# Patient Record
Sex: Female | Born: 1940 | Race: White | Hispanic: No | Marital: Married | Smoking: Former smoker
Health system: Southern US, Community
[De-identification: ages and names within clinical notes are randomized; demographics above are authoritative.]

## PROBLEM LIST (undated history)

## (undated) DIAGNOSIS — M1990 Unspecified osteoarthritis, unspecified site: Secondary | ICD-10-CM

## (undated) DIAGNOSIS — E785 Hyperlipidemia, unspecified: Secondary | ICD-10-CM

## (undated) DIAGNOSIS — F419 Anxiety disorder, unspecified: Secondary | ICD-10-CM

## (undated) DIAGNOSIS — R7303 Prediabetes: Secondary | ICD-10-CM

## (undated) DIAGNOSIS — T7840XA Allergy, unspecified, initial encounter: Secondary | ICD-10-CM

## (undated) DIAGNOSIS — K5790 Diverticulosis of intestine, part unspecified, without perforation or abscess without bleeding: Secondary | ICD-10-CM

## (undated) DIAGNOSIS — K219 Gastro-esophageal reflux disease without esophagitis: Secondary | ICD-10-CM

## (undated) DIAGNOSIS — H269 Unspecified cataract: Secondary | ICD-10-CM

## (undated) DIAGNOSIS — E039 Hypothyroidism, unspecified: Secondary | ICD-10-CM

## (undated) HISTORY — DX: Unspecified cataract: H26.9

## (undated) HISTORY — DX: Diverticulosis of intestine, part unspecified, without perforation or abscess without bleeding: K57.90

## (undated) HISTORY — PX: EYE SURGERY: SHX253

## (undated) HISTORY — PX: HEMORROIDECTOMY: SUR656

## (undated) HISTORY — PX: TUBAL LIGATION: SHX77

## (undated) HISTORY — DX: Hyperlipidemia, unspecified: E78.5

## (undated) HISTORY — DX: Allergy, unspecified, initial encounter: T78.40XA

## (undated) HISTORY — PX: COSMETIC SURGERY: SHX468

## (undated) HISTORY — PX: APPENDECTOMY: SHX54

## (undated) HISTORY — DX: Unspecified osteoarthritis, unspecified site: M19.90

## (undated) HISTORY — DX: Anxiety disorder, unspecified: F41.9

## (undated) HISTORY — DX: Prediabetes: R73.03

## (undated) HISTORY — PX: COLONOSCOPY: SHX174

## (undated) HISTORY — DX: Gastro-esophageal reflux disease without esophagitis: K21.9

## (undated) HISTORY — PX: BREAST SURGERY: SHX581

## (undated) HISTORY — DX: Hypothyroidism, unspecified: E03.9

---

## 2000-03-20 ENCOUNTER — Encounter (INDEPENDENT_AMBULATORY_CARE_PROVIDER_SITE_OTHER): Payer: Self-pay

## 2000-03-20 ENCOUNTER — Other Ambulatory Visit: Admission: RE | Admit: 2000-03-20 | Discharge: 2000-03-20 | Payer: Self-pay | Admitting: Internal Medicine

## 2000-10-14 ENCOUNTER — Inpatient Hospital Stay (HOSPITAL_COMMUNITY): Admission: EM | Admit: 2000-10-14 | Discharge: 2000-10-15 | Payer: Self-pay | Admitting: Emergency Medicine

## 2000-10-14 ENCOUNTER — Encounter: Payer: Self-pay | Admitting: Emergency Medicine

## 2000-11-29 ENCOUNTER — Ambulatory Visit (HOSPITAL_COMMUNITY): Admission: RE | Admit: 2000-11-29 | Discharge: 2000-11-29 | Payer: Self-pay | Admitting: Cardiology

## 2000-12-26 ENCOUNTER — Ambulatory Visit (HOSPITAL_COMMUNITY): Admission: RE | Admit: 2000-12-26 | Discharge: 2000-12-26 | Payer: Self-pay | Admitting: Family Medicine

## 2000-12-26 ENCOUNTER — Encounter: Payer: Self-pay | Admitting: Family Medicine

## 2001-01-14 ENCOUNTER — Encounter: Payer: Self-pay | Admitting: Family Medicine

## 2001-01-14 ENCOUNTER — Ambulatory Visit (HOSPITAL_COMMUNITY): Admission: RE | Admit: 2001-01-14 | Discharge: 2001-01-14 | Payer: Self-pay | Admitting: Family Medicine

## 2001-06-27 ENCOUNTER — Encounter: Admission: RE | Admit: 2001-06-27 | Discharge: 2001-06-27 | Payer: Self-pay

## 2001-08-30 ENCOUNTER — Encounter (INDEPENDENT_AMBULATORY_CARE_PROVIDER_SITE_OTHER): Payer: Self-pay | Admitting: *Deleted

## 2001-08-30 ENCOUNTER — Ambulatory Visit (HOSPITAL_BASED_OUTPATIENT_CLINIC_OR_DEPARTMENT_OTHER): Admission: RE | Admit: 2001-08-30 | Discharge: 2001-08-30 | Payer: Self-pay

## 2001-10-31 ENCOUNTER — Encounter: Payer: Self-pay | Admitting: Vascular Surgery

## 2001-11-04 ENCOUNTER — Ambulatory Visit (HOSPITAL_COMMUNITY): Admission: RE | Admit: 2001-11-04 | Discharge: 2001-11-04 | Payer: Self-pay | Admitting: Vascular Surgery

## 2002-01-29 ENCOUNTER — Encounter: Payer: Self-pay | Admitting: Family Medicine

## 2002-01-29 ENCOUNTER — Ambulatory Visit (HOSPITAL_COMMUNITY): Admission: RE | Admit: 2002-01-29 | Discharge: 2002-01-29 | Payer: Self-pay | Admitting: Family Medicine

## 2002-02-14 ENCOUNTER — Encounter: Admission: RE | Admit: 2002-02-14 | Discharge: 2002-02-14 | Payer: Self-pay | Admitting: Family Medicine

## 2002-02-14 ENCOUNTER — Encounter: Payer: Self-pay | Admitting: Family Medicine

## 2003-02-19 ENCOUNTER — Ambulatory Visit (HOSPITAL_COMMUNITY): Admission: RE | Admit: 2003-02-19 | Discharge: 2003-02-19 | Payer: Self-pay | Admitting: Family Medicine

## 2004-02-22 ENCOUNTER — Ambulatory Visit (HOSPITAL_COMMUNITY): Admission: RE | Admit: 2004-02-22 | Discharge: 2004-02-22 | Payer: Self-pay | Admitting: Family Medicine

## 2005-02-13 DIAGNOSIS — K5790 Diverticulosis of intestine, part unspecified, without perforation or abscess without bleeding: Secondary | ICD-10-CM

## 2005-02-13 HISTORY — DX: Diverticulosis of intestine, part unspecified, without perforation or abscess without bleeding: K57.90

## 2005-03-06 ENCOUNTER — Ambulatory Visit (HOSPITAL_COMMUNITY): Admission: RE | Admit: 2005-03-06 | Discharge: 2005-03-06 | Payer: Self-pay | Admitting: Family Medicine

## 2005-03-13 ENCOUNTER — Ambulatory Visit (HOSPITAL_COMMUNITY): Admission: RE | Admit: 2005-03-13 | Discharge: 2005-03-13 | Payer: Self-pay | Admitting: Family Medicine

## 2005-03-20 ENCOUNTER — Ambulatory Visit: Payer: Self-pay | Admitting: Internal Medicine

## 2005-04-03 ENCOUNTER — Ambulatory Visit: Payer: Self-pay | Admitting: Internal Medicine

## 2006-03-08 ENCOUNTER — Ambulatory Visit (HOSPITAL_COMMUNITY): Admission: RE | Admit: 2006-03-08 | Discharge: 2006-03-08 | Payer: Self-pay | Admitting: Family Medicine

## 2006-06-05 ENCOUNTER — Ambulatory Visit (HOSPITAL_COMMUNITY): Admission: RE | Admit: 2006-06-05 | Discharge: 2006-06-05 | Payer: Self-pay | Admitting: Family Medicine

## 2006-06-14 ENCOUNTER — Encounter (HOSPITAL_COMMUNITY): Admission: RE | Admit: 2006-06-14 | Discharge: 2006-07-14 | Payer: Self-pay | Admitting: Family Medicine

## 2007-01-17 ENCOUNTER — Ambulatory Visit (HOSPITAL_COMMUNITY): Admission: RE | Admit: 2007-01-17 | Discharge: 2007-01-17 | Payer: Self-pay | Admitting: Family Medicine

## 2007-01-22 ENCOUNTER — Ambulatory Visit (HOSPITAL_COMMUNITY): Admission: RE | Admit: 2007-01-22 | Discharge: 2007-01-22 | Payer: Self-pay | Admitting: Ophthalmology

## 2007-02-12 ENCOUNTER — Ambulatory Visit (HOSPITAL_COMMUNITY): Admission: RE | Admit: 2007-02-12 | Discharge: 2007-02-12 | Payer: Self-pay | Admitting: Ophthalmology

## 2007-03-11 ENCOUNTER — Ambulatory Visit (HOSPITAL_COMMUNITY): Admission: RE | Admit: 2007-03-11 | Discharge: 2007-03-11 | Payer: Self-pay | Admitting: Family Medicine

## 2007-05-15 ENCOUNTER — Ambulatory Visit (HOSPITAL_COMMUNITY): Admission: RE | Admit: 2007-05-15 | Discharge: 2007-05-15 | Payer: Self-pay | Admitting: Family Medicine

## 2007-07-22 ENCOUNTER — Ambulatory Visit (HOSPITAL_COMMUNITY): Admission: RE | Admit: 2007-07-22 | Discharge: 2007-07-22 | Payer: Self-pay | Admitting: Family Medicine

## 2007-12-14 ENCOUNTER — Emergency Department (HOSPITAL_COMMUNITY): Admission: EM | Admit: 2007-12-14 | Discharge: 2007-12-14 | Payer: Self-pay | Admitting: Emergency Medicine

## 2008-01-14 HISTORY — PX: CATARACT EXTRACTION W/ INTRAOCULAR LENS  IMPLANT, BILATERAL: SHX1307

## 2008-03-11 ENCOUNTER — Ambulatory Visit (HOSPITAL_COMMUNITY): Admission: RE | Admit: 2008-03-11 | Discharge: 2008-03-11 | Payer: Self-pay | Admitting: Family Medicine

## 2008-06-13 HISTORY — PX: DOPPLER ECHOCARDIOGRAPHY: SHX263

## 2008-06-17 ENCOUNTER — Ambulatory Visit (HOSPITAL_COMMUNITY): Admission: RE | Admit: 2008-06-17 | Discharge: 2008-06-17 | Payer: Self-pay | Admitting: Family Medicine

## 2009-08-13 ENCOUNTER — Ambulatory Visit (HOSPITAL_COMMUNITY): Admission: RE | Admit: 2009-08-13 | Discharge: 2009-08-13 | Payer: Self-pay | Admitting: Family Medicine

## 2009-08-13 HISTORY — PX: OTHER SURGICAL HISTORY: SHX169

## 2009-11-16 ENCOUNTER — Ambulatory Visit (HOSPITAL_COMMUNITY): Admission: RE | Admit: 2009-11-16 | Discharge: 2009-11-16 | Payer: Self-pay | Admitting: Family Medicine

## 2009-11-22 ENCOUNTER — Ambulatory Visit (HOSPITAL_COMMUNITY): Admission: RE | Admit: 2009-11-22 | Discharge: 2009-11-22 | Payer: Self-pay | Admitting: Family Medicine

## 2009-12-02 ENCOUNTER — Ambulatory Visit (HOSPITAL_COMMUNITY): Admission: RE | Admit: 2009-12-02 | Discharge: 2009-12-02 | Payer: Self-pay | Admitting: Family Medicine

## 2010-03-05 ENCOUNTER — Encounter: Payer: Self-pay | Admitting: Family Medicine

## 2010-03-06 ENCOUNTER — Encounter: Payer: Self-pay | Admitting: Family Medicine

## 2010-07-01 NOTE — Op Note (Signed)
[place]  Patient:    STACEY, SAGO Visit Number: [id number] MRN: [id number]          Service Type: DSU Location: [facility] Attending Physician:  Meredith Leeds Dictated by:   Zigmund Daniel, M.D. Proc. Date: 08/30/01 Admit Date:  08/30/2001 Discharge Date: 08/30/2001   CC:         Lilyan Punt, M.D.   Operative Report  PREOPERATIVE AND POSTOPERATIVE DIAGNOSIS:  Fibrocystic disease of left breast.  OPERATION:  Left breast biopsy.  SURGEON:  Zigmund Daniel, M.D.  ANESTHESIA:  Local with sedation.  PROCEDURE:  After the patient was monitored, sedated, and had routine preparation and draping of the left breast, I liberally infused local anesthetic in the upper central part of the breast near the site where I had marked the mass.  I made a circumareolar incision at the cephalad portion of the areola and dissected down through the skin into the deep subcutaneous tissues.  I dissected upward a few centimeters until I was above the mass and then generously biopsies the tissues containing the mass and around it.  I cut across a large cyst, which I think was most of the mass and the remainder of the tissue had a fibrocystic appearance.  I then got good hemostasis with the cautery and felt the tissues to be sure I had not left any mass behind.  I closed the skin with intracuticular 4-0 Vicryl and Steri-Strips and applied a slightly compressive bandage. Dictated by:   Zigmund Daniel, M.D. Attending Physician:  Meredith Leeds DD:  08/30/01 TD:  09/04/01 Job: [id number]

## 2010-07-01 NOTE — Procedures (Signed)
[facility]  Patient:    Jordan Grant, Jordan Grant Visit Number: [id number] MRN: [id number]          Service Type: OUT Location: RAD Attending Physician:  Lilyan Punt Dictated by:   Lilyan Punt, M.D. Admit Date:  12/26/2000 Discharge Date: 12/26/2000                            EKG Interpretations  ELECTROCARDIOGRAM INTERPRETATION:  Normal sinus rhythm, no acute ST segment changes are noted. There may be slight elevation of V5 and V6 in the ST segment, otherwise is a normal EKG. Dictated by:   Lilyan Punt, M.D. Attending Physician:  Lilyan Punt DD:  12/20/00 TD:  12/21/00 Job: [id number]

## 2010-07-01 NOTE — Procedures (Signed)
[facility]  Patient:    Jordan Grant, Jordan Grant Visit Number: [id number] MRN: [id number]          Service Type: OUT Location: RAD Attending Physician:  Kathlen Brunswick Dictated by:   Lilyan Punt, M.D. Proc. Date: 10/14/00 Admit Date:  11/29/2000 Discharge Date: 11/29/2000                            EKG Interpretations  INTERPRETATION:  Normal sinus rhythm.  There are no acute ST segment changes noted.  IMPRESSION:  Normal electrocardiogram. Dictated by:   Lilyan Punt, M.D. Attending Physician:  Kathlen Brunswick DD:  12/20/00 TD:  12/21/00 Job: [id number]

## 2010-07-01 NOTE — Procedures (Signed)
[facility]  Patient:    Jordan Grant, Jordan Grant Visit Number: [id number] MRN: [id number]          Service Type: OUT Location: RAD Attending Physician:  Lilyan Punt Dictated by:   Lilyan Punt, M.D. Proc. Date: 10/14/00 Admit Date:  12/26/2000 Discharge Date: 12/26/2000                            EKG Interpretations  TIME:  1150  INTERPRETATION:  Normal sinus rhythm.  Early transition.  Slight ST segment elevation noted in inferior leads.  IMPRESSION:  Borderline electrocardiogram. Dictated by:   Lilyan Punt, M.D. Attending Physician:  Lilyan Punt DD:  12/31/00 TD:  12/31/00 Job: [id number]

## 2010-07-01 NOTE — Procedures (Signed)
[facility]  Patient:    Jordan Grant, Jordan Grant Visit Number: [id number] MRN: [id number]          Service Type: OUT Location: RAD Attending Physician:  Lilyan Punt Dictated by:   Lilyan Punt, M.D. Proc. Date: 10/15/00 Admit Date:  12/26/2000 Discharge Date: 12/26/2000                            EKG Interpretations  TIME:  0904  INTERPRETATION:  Normal sinus rhythm.  Early transition.  Slight ST segment elevation.  IMPRESSION:  Borderline electrocardiogram. Dictated by:   Lilyan Punt, M.D. Attending Physician:  Lilyan Punt DD:  12/31/00 TD:  12/31/00 Job: [id number]

## 2010-07-01 NOTE — Op Note (Signed)
   NAME:  Jordan Grant, Jordan Grant                         ACCOUNT NO.:  [id number]   MEDICAL RECORD NO.:  [id number]                   PATIENT TYPE:  OIB   LOCATION:  2899                                 FACILITY:  [facility]   PHYSICIAN:  Larina Earthly, M.D.                 DATE OF BIRTH:  [birth year]   DATE OF PROCEDURE:  11/04/2001  DATE OF DISCHARGE:                                 OPERATIVE REPORT   PREOPERATIVE DIAGNOSIS:  Tributary varicosities, left leg.   POSTOPERATIVE DIAGNOSIS:  Tributary varicosities, left leg.   PROCEDURE:  Removal of tributary varicosities with Tri-Vex system.   SURGEON:  Larina Earthly, M.D.   ASSISTANT:  Eber Hong, P.A.   ANESTHESIA:  General endotracheal.   COMPLICATIONS:  None.   DISPOSITION:  To recovery room stable.   DESCRIPTION OF PROCEDURE:  The patient was taken to the operating room and  placed in the supine position after having her veins marked while standing.  She had had preoperative duplex evaluation revealing no evidence of  incompetence in her saphenous venous system.  Using the Tri-Vex system with  tumescent solution of epinephrine and lidocaine, the small stab incisions  were created and these veins were transilluminated.  Through separate stab  incisions, the cutter head was introduced and the tributary varicosities  were removed in their entirety.  The areas of removed tributary varicosities  were flushed with tumescent solution.  The incisions were closed with  Benzoin and Steri-Strips.  The leg was elevated, and a Kerlix and Coban  pressure dressing was applied.  The patient tolerated the procedure without  immediate complication, was transferred to the recovery room in stable  condition.                                               Larina Earthly, M.D.    TFE/MEDQ  D:  11/04/2001  T:  11/05/2001  Job:  [id number]

## 2010-10-18 ENCOUNTER — Other Ambulatory Visit: Payer: Self-pay | Admitting: Family Medicine

## 2010-10-18 DIAGNOSIS — Z139 Encounter for screening, unspecified: Secondary | ICD-10-CM

## 2010-11-21 LAB — BASIC METABOLIC PANEL
CO2: 31
Chloride: 104
GFR calc non Af Amer: >60
Glucose, Bld: 71
Potassium: 3.8
Sodium: 140

## 2010-11-21 LAB — CBC
HCT: 38.5
Hemoglobin: 12.9
MCV: 91.5
RDW: 12.4

## 2010-11-28 ENCOUNTER — Ambulatory Visit (HOSPITAL_COMMUNITY)
Admission: RE | Admit: 2010-11-28 | Discharge: 2010-11-28 | Disposition: A | Discharge status: Home / Self Care | Payer: Medicare Other | Source: Ambulatory Visit | Attending: Family Medicine | Admitting: Family Medicine

## 2010-11-28 DIAGNOSIS — Z139 Encounter for screening, unspecified: Secondary | ICD-10-CM

## 2010-11-28 DIAGNOSIS — Z1231 Encounter for screening mammogram for malignant neoplasm of breast: Secondary | ICD-10-CM | POA: Insufficient documentation

## 2011-08-01 ENCOUNTER — Ambulatory Visit (HOSPITAL_COMMUNITY)
Admission: RE | Admit: 2011-08-01 | Discharge: 2011-08-01 | Disposition: A | Discharge status: Home / Self Care | Payer: Medicare Other | Source: Ambulatory Visit | Attending: Family Medicine | Admitting: Family Medicine

## 2011-08-01 ENCOUNTER — Other Ambulatory Visit: Payer: Self-pay | Admitting: Family Medicine

## 2011-08-01 DIAGNOSIS — R1031 Right lower quadrant pain: Secondary | ICD-10-CM

## 2011-08-01 DIAGNOSIS — M25551 Pain in right hip: Secondary | ICD-10-CM

## 2011-08-01 DIAGNOSIS — M25559 Pain in unspecified hip: Secondary | ICD-10-CM | POA: Insufficient documentation

## 2011-08-25 ENCOUNTER — Ambulatory Visit (HOSPITAL_COMMUNITY)
Admission: RE | Admit: 2011-08-25 | Discharge: 2011-08-25 | Disposition: A | Discharge status: Home / Self Care | Payer: Medicare Other | Source: Ambulatory Visit | Attending: Family Medicine | Admitting: Family Medicine

## 2011-08-25 ENCOUNTER — Encounter (HOSPITAL_COMMUNITY): Payer: Self-pay

## 2011-08-25 DIAGNOSIS — R1031 Right lower quadrant pain: Secondary | ICD-10-CM

## 2011-08-25 MED ORDER — IOHEXOL 300 MG/ML  SOLN
100.0000 mL | Freq: Once | INTRAMUSCULAR | Status: AC | PRN
  Administered 2011-08-25: 100 mL via INTRAVENOUS

## 2011-08-28 ENCOUNTER — Ambulatory Visit (HOSPITAL_COMMUNITY): Payer: Medicare Other

## 2011-08-29 ENCOUNTER — Telehealth: Payer: Self-pay | Admitting: *Deleted

## 2011-08-29 NOTE — Telephone Encounter (Signed)
Dr Marina Goodell asked me to call the pt and make her an appointment to see an extender.  Dr. Lilyan Punt spoke to Dr. Marina Goodell on the phone about this pt.  Dr. Marina Goodell had done the pt's Colonoscopies in 2002 and 2007 .  I called the patient and she said she has had bloating, gas, abdominal pain.  She went on a bus trip recently and had a terrible time the first 3 days., The patient almost left the bus to fly home but decided against it.  I told her Willette Cluster ACNP has an opening tomorrow and the pt said that is fine as long as Dr Marina Goodell would be the one to do any procedures in the future.

## 2011-08-30 ENCOUNTER — Encounter: Payer: Self-pay | Admitting: *Deleted

## 2011-08-30 ENCOUNTER — Encounter: Payer: Self-pay | Admitting: Nurse Practitioner

## 2011-08-30 ENCOUNTER — Ambulatory Visit (INDEPENDENT_AMBULATORY_CARE_PROVIDER_SITE_OTHER): Payer: Medicare Other | Admitting: Nurse Practitioner

## 2011-08-30 VITALS — BP 104/60 | HR 68 | Ht 63.0 in | Wt 141.2 lb

## 2011-08-30 DIAGNOSIS — IMO0001 Reserved for inherently not codable concepts without codable children: Secondary | ICD-10-CM | POA: Insufficient documentation

## 2011-08-30 DIAGNOSIS — R142 Eructation: Secondary | ICD-10-CM

## 2011-08-30 DIAGNOSIS — R194 Change in bowel habit: Secondary | ICD-10-CM | POA: Insufficient documentation

## 2011-08-30 DIAGNOSIS — R141 Gas pain: Secondary | ICD-10-CM

## 2011-08-30 DIAGNOSIS — R198 Other specified symptoms and signs involving the digestive system and abdomen: Secondary | ICD-10-CM

## 2011-08-30 DIAGNOSIS — R1031 Right lower quadrant pain: Secondary | ICD-10-CM | POA: Insufficient documentation

## 2011-08-30 MED ORDER — ALIGN PO CAPS: 1.0000 | ORAL_CAPSULE | Freq: Every day | ORAL | Status: AC

## 2011-08-30 NOTE — Progress Notes (Signed)
08/30/2011 Jordan Grant [id number] 1940/07/29   HISTORY OF PRESENT ILLNESS:   71 year old female known to Dr. Marina Goodell for history of diverticulosis and adenomatous colon polyps. She had a surveillance colonoscopy February 2007, findings included diverticulosis and internal hemorrhoids. Patient referred by PCP for heme stool.      Over the last several months she has been having excessive gas and RLQ discomfort.  Patient also describes some RLQ pain present over the last few months. Pain not related to eating, BMs or urination. It has woken her up at night. Patient's sister recommended daily Miralax which patient took for 2-3 days. The first day it worked well but second day she developed loose stool. Diarrhea stopped, she went on a bus trip for 10 days and developed more bowel changes.  Stools turned pasty, dark. Bowel movements did normalize after about the 5th day on vacation. Upon returning home from the trip patient saw PCP, found to be heme positive. Labs in PCP office on 08/24/11 revealed hemoglobin of 13.1, MCV  89. Liver function studies were normal . Amylase and lipase were normal. No recent bismuth or iron products. No NSAID use. Because of the RLQ a CTscan was done and unremarkable. Patient's husband died with colon cancer, she has some anxiety around developing colon cancer herself.                                                                                                                                                                                                                                                                                                                                           Past Medical History  Diagnosis Date  . Hyperlipidemia   . Hypothyroidism   . Diverticulosis 2007    History of diverticula  . GERD (gastroesophageal reflux disease)    Past Surgical History  Procedure Date  . Hemorroidectomy   .  Appendectomy   . Cataract extraction w/  intraocular lens  implant, bilateral 12/09    Dr Micheal Likens bilateral  . Ml bone density 07/11  . Doppler echocardiography 06/2008    leg  . Tubal ligation     reports that she has quit smoking. She has never used smokeless tobacco. She reports that she drinks about one ounce of alcohol per week. She reports that she does not use illicit drugs. family history includes Breast cancer in her maternal aunt and Stomach cancer (age of onset:72) in her mother.  There is no history of Colon cancer. Allergies  Allergen Reactions  . Codeine     Nausea, dizziness  . Darvocet (Propoxyphene-Acetaminophen)     nausea      Outpatient Encounter Prescriptions as of 08/30/2011  Medication Sig Dispense Refill  . ALPRAZolam (XANAX) 0.5 MG tablet Take 0.25 mg by mouth at bedtime as needed.       Marland Kitchen levothyroxine (SYNTHROID, LEVOTHROID) 75 MCG tablet Take 75 mcg by mouth daily.      Marland Kitchen omeprazole (PRILOSEC) 40 MG capsule Take 40 mg by mouth daily.      Marland Kitchen DISCONTD: omeprazole (PRILOSEC) 20 MG capsule Take 40 mg by mouth daily.         REVIEW OF SYSTEMS  : All other systems reviewed and negative except where noted in the History of Present Illness.   PHYSICAL EXAM: BP 104/60  Pulse 68  Ht 5\' 3"  (1.6 m)  Wt 141 lb 3.2 oz (64.048 kg)  BMI 25.01 kg/m2 General: Well developed white female in no acute distress Head: Normocephalic and atraumatic Eyes:  sclerae anicteric,conjunctive pink. Ears: Normal auditory acuity Mouth: No deformity or lesions Neck: Supple, no masses.  Lungs: Clear throughout to auscultation Heart: Regular rate and rhythm; no murmurs heard Abdomen: Soft, non distended, nontender. No masses or hepatomegaly noted. Normal Bowel sounds Rectal: Light brown, Hemoccult-negative stool Musculoskeletal: Symmetrical with no gross deformities  Skin: No lesions on visible extremities Extremities: No edema or deformities noted Neurological: Alert oriented x 4, grossly nonfocal Cervical Nodes:  No  significant cervical adenopathy Psychological:  Alert and cooperative. Normal mood and affect  ASSESSMENT AND PLAN:  1. Gas. Trial of Align, samples given.   2. RLQ discomfort, possibly secondary to gas. The discomfort is improving. CT Scan and labs normal and this is reassuring.   3. Recent bowel change after taking Miralax. Bowel habits have returned to baseline after discontinuing Miralax and resuming daily Citrucel.   4. Hemoccult positive stool in PCP. Recent dark, but not black stool per patient. No upper GI symptoms. Stool color back to normal. Recent hemoglobin over 13. She is heme negative today. Low suspicion for recent GI bleed. If patient has recurrent bowel changes and /or dark stool then need to see her back asap. No indication for upper endoscopy at this point.   4. Colon cancer screening. Husband died of colon cancer, patient has anxiety around getting colon cancer herself. Patient is up-to-date on her colonoscopies. She is not anemic, bowel movements have returned to normal, weight is stable. No current indications for colonoscopy to be done sooner than previously recommended. Should patient have another change in her bowel habits, any rectal bleeding, or other alarm factors then we need to see her back in the office.

## 2011-08-30 NOTE — Patient Instructions (Addendum)
Call us if you have any recurrent bowel changes.   We have given you samples of Align probiotic. Take 1 capsule daily for 14 days.  Our number is [phone].

## 2011-08-31 ENCOUNTER — Telehealth: Payer: Self-pay | Admitting: Nurse Practitioner

## 2011-08-31 NOTE — Telephone Encounter (Signed)
Left a message for patient to call me. 

## 2011-08-31 NOTE — Progress Notes (Signed)
Reviewed and agree with management plan. Haston Casebolt T. Kiernan Farkas MD FACG 

## 2011-09-04 NOTE — Telephone Encounter (Signed)
Left a message for patient to call me. 

## 2011-09-05 NOTE — Telephone Encounter (Signed)
Spoke with patient and she was upset about the reason for visit comments on the paper she received. Reviewed with patient Jordan Cluster, Jordan Grant; notes and patient felt much better about her notes. She is using the Align and thinks it is helping some but states she is still having severe gas at times with abdominal pain. Scheduled patient to see Dr. Marina Goodell on 09/28/11 at 9:15 AM. She will call and cancel if she feels she does not need to come for this OV.

## 2011-09-26 ENCOUNTER — Telehealth: Payer: Self-pay | Admitting: Internal Medicine

## 2011-09-26 NOTE — Telephone Encounter (Signed)
Pt states she was supposed to call after she finished taking the align. Pt states she feels much better and she wants to cancel her appt for this week. Appt cancelled. Pt states she will call back if she develops any more problems.

## 2011-09-28 ENCOUNTER — Ambulatory Visit: Payer: Medicare Other | Admitting: Internal Medicine

## 2011-10-30 ENCOUNTER — Other Ambulatory Visit: Payer: Self-pay | Admitting: Family Medicine

## 2011-10-30 DIAGNOSIS — Z139 Encounter for screening, unspecified: Secondary | ICD-10-CM

## 2011-12-05 ENCOUNTER — Ambulatory Visit (HOSPITAL_COMMUNITY)
Admission: RE | Admit: 2011-12-05 | Discharge: 2011-12-05 | Disposition: A | Discharge status: Home / Self Care | Payer: Medicare Other | Source: Ambulatory Visit | Attending: Family Medicine | Admitting: Family Medicine

## 2011-12-05 DIAGNOSIS — Z139 Encounter for screening, unspecified: Secondary | ICD-10-CM

## 2011-12-05 DIAGNOSIS — Z1231 Encounter for screening mammogram for malignant neoplasm of breast: Secondary | ICD-10-CM | POA: Insufficient documentation

## 2012-07-19 ENCOUNTER — Encounter: Payer: Self-pay | Admitting: *Deleted

## 2012-08-22 ENCOUNTER — Telehealth: Payer: Self-pay | Admitting: Family Medicine

## 2012-08-22 NOTE — Telephone Encounter (Signed)
Patient needs BW paperwork-Call when done

## 2012-08-22 NOTE — Telephone Encounter (Signed)
Chart sent back °

## 2012-08-23 ENCOUNTER — Other Ambulatory Visit: Payer: Self-pay | Admitting: *Deleted

## 2012-08-23 DIAGNOSIS — E039 Hypothyroidism, unspecified: Secondary | ICD-10-CM

## 2012-08-23 DIAGNOSIS — R7301 Impaired fasting glucose: Secondary | ICD-10-CM

## 2012-08-23 DIAGNOSIS — Z79899 Other long term (current) drug therapy: Secondary | ICD-10-CM

## 2012-08-23 DIAGNOSIS — E782 Mixed hyperlipidemia: Secondary | ICD-10-CM

## 2012-08-23 NOTE — Telephone Encounter (Signed)
Lip/liv/tsh/glucose/HgA1C

## 2012-08-23 NOTE — Telephone Encounter (Signed)
Pt notified on her voicemail papers are ready for pick up.

## 2012-09-04 LAB — HEPATIC FUNCTION PANEL
Albumin: 4.1 g/dL (ref 3.5–5.2)
Alkaline Phosphatase: 78 U/L (ref 39–117)
Bilirubin, Direct: 0.1 mg/dL (ref 0.0–0.3)
Total Bilirubin: 0.6 mg/dL (ref 0.3–1.2)

## 2012-09-04 LAB — LIPID PANEL
HDL: 54 mg/dL (ref >39)
LDL Cholesterol: 154 mg/dL — ABNORMAL HIGH (ref 0–99)

## 2012-09-05 LAB — HEMOGLOBIN A1C
Hgb A1c MFr Bld: 6 % — ABNORMAL HIGH (ref <5.7)
Mean Plasma Glucose: 126 mg/dL — ABNORMAL HIGH (ref <117)

## 2012-09-05 LAB — TSH: TSH: 0.855 u[IU]/mL (ref 0.350–4.500)

## 2012-09-08 ENCOUNTER — Encounter: Payer: Self-pay | Admitting: Family Medicine

## 2012-09-13 ENCOUNTER — Encounter (HOSPITAL_COMMUNITY): Payer: Self-pay

## 2012-09-19 ENCOUNTER — Encounter (HOSPITAL_COMMUNITY): Payer: Self-pay | Admitting: Pharmacy Technician

## 2012-09-24 ENCOUNTER — Encounter (HOSPITAL_COMMUNITY)
Admission: RE | Disposition: A | Discharge status: Home / Self Care | Payer: Self-pay | Source: Ambulatory Visit | Attending: Ophthalmology

## 2012-09-24 ENCOUNTER — Encounter (HOSPITAL_COMMUNITY): Payer: Self-pay | Admitting: *Deleted

## 2012-09-24 ENCOUNTER — Ambulatory Visit (HOSPITAL_COMMUNITY)
Admission: RE | Admit: 2012-09-24 | Discharge: 2012-09-24 | Disposition: A | Discharge status: Home / Self Care | Payer: Medicare Other | Source: Ambulatory Visit | Attending: Ophthalmology | Admitting: Ophthalmology

## 2012-09-24 DIAGNOSIS — E7800 Pure hypercholesterolemia, unspecified: Secondary | ICD-10-CM | POA: Insufficient documentation

## 2012-09-24 DIAGNOSIS — E079 Disorder of thyroid, unspecified: Secondary | ICD-10-CM | POA: Insufficient documentation

## 2012-09-24 DIAGNOSIS — H26499 Other secondary cataract, unspecified eye: Secondary | ICD-10-CM | POA: Insufficient documentation

## 2012-09-24 DIAGNOSIS — Z79899 Other long term (current) drug therapy: Secondary | ICD-10-CM | POA: Insufficient documentation

## 2012-09-24 DIAGNOSIS — Z885 Allergy status to narcotic agent status: Secondary | ICD-10-CM | POA: Insufficient documentation

## 2012-09-24 DIAGNOSIS — K219 Gastro-esophageal reflux disease without esophagitis: Secondary | ICD-10-CM | POA: Insufficient documentation

## 2012-09-24 HISTORY — PX: YAG LASER APPLICATION: SHX6189

## 2012-09-24 SURGERY — TREATMENT, USING YAG LASER
Anesthesia: LOCAL | Laterality: Right

## 2012-09-24 MED ORDER — TROPICAMIDE 1 % OP SOLN
1.0000 [drp] | OPHTHALMIC | Status: AC
  Administered 2012-09-24 (×2): 1 [drp] via OPHTHALMIC

## 2012-09-24 MED ORDER — TROPICAMIDE 1 % OP SOLN
OPHTHALMIC | Status: AC
  Filled 2012-09-24: qty 3

## 2012-09-24 NOTE — Brief Op Note (Signed)
Jordan Grant 09/24/2012  Genova Kiner T. Nile Riggs, MD  Pre-op diagnosis:  opacification right eye  Post-op diagnosis: * No post-op diagnosis entered *  . Procedure: Posterior Capsulotomy, right eye.  Eye Protection Worn by Staff yes. Laser In Use Sign on Door yes.  Laser: Nd:YAG Spot Size: Fixed Burst Mode: III Power Setting: 3.3 mJ/burst  Number of shots: 17 Total energy delivered: 55.8 mJ  The patient tolerated the procedure without difficulty. No complications were encountered.   The patient was discharged home with the instructions to continue all her current glaucoma medications, if any.   Patient instructed to go to office at 0100 for intraocular pressure check.  Patient verbalizes understanding of discharge instructions yes.

## 2012-09-24 NOTE — H&P (Signed)
The patient was re examined and there is no change in the patients condition since the original H and P. 

## 2012-09-27 ENCOUNTER — Encounter (HOSPITAL_COMMUNITY): Payer: Self-pay | Admitting: Ophthalmology

## 2012-10-08 ENCOUNTER — Encounter (HOSPITAL_COMMUNITY)
Admission: RE | Disposition: A | Discharge status: Home / Self Care | Payer: Self-pay | Source: Ambulatory Visit | Attending: Ophthalmology

## 2012-10-08 ENCOUNTER — Encounter (HOSPITAL_COMMUNITY): Payer: Self-pay

## 2012-10-08 ENCOUNTER — Ambulatory Visit (HOSPITAL_COMMUNITY)
Admission: RE | Admit: 2012-10-08 | Discharge: 2012-10-08 | Disposition: A | Discharge status: Home / Self Care | Payer: Medicare Other | Source: Ambulatory Visit | Attending: Ophthalmology | Admitting: Ophthalmology

## 2012-10-08 DIAGNOSIS — H26499 Other secondary cataract, unspecified eye: Secondary | ICD-10-CM | POA: Insufficient documentation

## 2012-10-08 HISTORY — PX: YAG LASER APPLICATION: SHX6189

## 2012-10-08 SURGERY — TREATMENT, USING YAG LASER
Anesthesia: LOCAL | Laterality: Left

## 2012-10-08 MED ORDER — TROPICAMIDE 1 % OP SOLN
1.0000 [drp] | OPHTHALMIC | Status: AC
  Administered 2012-10-08 (×2): 1 [drp] via OPHTHALMIC

## 2012-10-08 MED ORDER — TROPICAMIDE 1 % OP SOLN
OPHTHALMIC | Status: AC
  Filled 2012-10-08: qty 3

## 2012-10-08 NOTE — H&P (Signed)
The patient was re examined and there is no change in the patients condition since the original H and P. 

## 2012-10-08 NOTE — Brief Op Note (Signed)
Jordan Grant T. Nile Riggs, MD  Procedure: Yag Capsulotomy  Yag Laser Self Test Completedyes. Procedure: Posterior Capsulotomy, Eye Protection Worn by Staff yes. Laser In Use Sign on Door yes.  Laser: Nd:YAG Spot Size: Fixed Burst Mode: III Power Setting: 3.2 mJ/burst Number of shots: 16  Total energy delivered: 51.1 mJ   The patient tolerated the procedure without difficulty. No complications were encountered.   The patient was discharged home with the instructions to continue all her current glaucoma medications, if any.   Patient instructed to go to office at 0100 for intraocular pressure check.  Patient verbalizes understanding of discharge instructions yes.    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

## 2012-10-10 ENCOUNTER — Encounter (HOSPITAL_COMMUNITY): Payer: Self-pay | Admitting: Ophthalmology

## 2012-10-28 ENCOUNTER — Other Ambulatory Visit: Payer: Self-pay | Admitting: Family Medicine

## 2012-10-28 DIAGNOSIS — Z139 Encounter for screening, unspecified: Secondary | ICD-10-CM

## 2012-12-05 ENCOUNTER — Ambulatory Visit (HOSPITAL_COMMUNITY): Payer: Medicare Other

## 2012-12-10 ENCOUNTER — Ambulatory Visit (HOSPITAL_COMMUNITY)
Admission: RE | Admit: 2012-12-10 | Discharge: 2012-12-10 | Disposition: A | Discharge status: Home / Self Care | Payer: Medicare Other | Source: Ambulatory Visit | Attending: Family Medicine | Admitting: Family Medicine

## 2012-12-10 DIAGNOSIS — Z1231 Encounter for screening mammogram for malignant neoplasm of breast: Secondary | ICD-10-CM | POA: Insufficient documentation

## 2012-12-10 DIAGNOSIS — Z139 Encounter for screening, unspecified: Secondary | ICD-10-CM

## 2012-12-24 ENCOUNTER — Ambulatory Visit (INDEPENDENT_AMBULATORY_CARE_PROVIDER_SITE_OTHER): Payer: Medicare Other | Admitting: Family Medicine

## 2012-12-24 ENCOUNTER — Encounter: Payer: Self-pay | Admitting: Family Medicine

## 2012-12-24 VITALS — BP 102/60 | Temp 98.0°F | Ht 62.0 in | Wt 133.6 lb

## 2012-12-24 DIAGNOSIS — M25559 Pain in unspecified hip: Secondary | ICD-10-CM

## 2012-12-24 DIAGNOSIS — D509 Iron deficiency anemia, unspecified: Secondary | ICD-10-CM

## 2012-12-24 DIAGNOSIS — R109 Unspecified abdominal pain: Secondary | ICD-10-CM

## 2012-12-24 LAB — POCT URINALYSIS DIPSTICK: pH, UA: 7

## 2012-12-24 LAB — HEPATIC FUNCTION PANEL
ALT: 11 U/L (ref 0–35)
AST: 16 U/L (ref 0–37)
Alkaline Phosphatase: 86 U/L (ref 39–117)
Indirect Bilirubin: 0.3 mg/dL (ref 0.0–0.9)
Total Protein: 7.1 g/dL (ref 6.0–8.3)

## 2012-12-24 MED ORDER — HYOSCYAMINE SULFATE 0.125 MG SL SUBL: 0.1250 mg | SUBLINGUAL_TABLET | Freq: Four times a day (QID) | SUBLINGUAL | Status: DC | PRN

## 2012-12-24 NOTE — Progress Notes (Signed)
  Subjective:    Patient ID: Jordan Grant, female    DOB: 17-Sep-1940, 72 y.o.   MRN: [id number]  Abdominal Pain This is a new problem. The current episode started 1 to 4 weeks ago. The quality of the pain is cramping. The abdominal pain radiates to the back. Associated symptoms include nausea. Pertinent negatives include no arthralgias. Associated symptoms comments: Frequent stools. Exacerbated by: lying down.   Pain does sometimes wake her up at night moderate discomfort No personal history of colon cancer no family history: Cancer husband died of colon cancer.  Review of Systems  Constitutional: Negative for diaphoresis, activity change, appetite change and fatigue.  HENT: Negative for congestion, rhinorrhea and sinus pressure.   Respiratory: Negative for cough, chest tightness and shortness of breath.   Cardiovascular: Negative for chest pain.  Gastrointestinal: Positive for nausea and abdominal pain.  Endocrine: Negative for polydipsia and polyphagia.  Musculoskeletal: Negative for arthralgias.   patient does relate intermittent constipation issues     Objective:   Physical Exam  Vitals reviewed. Constitutional: She appears well-developed and well-nourished.  HENT:  Head: Normocephalic.  Right Ear: External ear normal.  Left Ear: External ear normal.          Assessment & Plan:  #1 abdominal discomfort-could be irritable bowel need to rule out celiac with the frequency of stools and also Hemoccult being positive last year I would recommend Hemoccult cards again if positive this patient would in fact need a colonoscopy last time she saw gastroenterology they did not feel she needed any further testing  #2 lower pelvic pain and bloating. Ultrasound. Look at the ovaries.

## 2012-12-26 ENCOUNTER — Ambulatory Visit (HOSPITAL_COMMUNITY)
Admission: RE | Admit: 2012-12-26 | Discharge: 2012-12-26 | Disposition: A | Discharge status: Home / Self Care | Payer: Medicare Other | Source: Ambulatory Visit | Attending: Family Medicine | Admitting: Family Medicine

## 2012-12-26 ENCOUNTER — Ambulatory Visit (HOSPITAL_COMMUNITY): Payer: Medicare Other

## 2012-12-26 DIAGNOSIS — N949 Unspecified condition associated with female genital organs and menstrual cycle: Secondary | ICD-10-CM | POA: Insufficient documentation

## 2012-12-26 DIAGNOSIS — D251 Intramural leiomyoma of uterus: Secondary | ICD-10-CM | POA: Insufficient documentation

## 2012-12-31 ENCOUNTER — Other Ambulatory Visit (INDEPENDENT_AMBULATORY_CARE_PROVIDER_SITE_OTHER): Payer: Medicare Other | Admitting: *Deleted

## 2012-12-31 DIAGNOSIS — D509 Iron deficiency anemia, unspecified: Secondary | ICD-10-CM

## 2012-12-31 LAB — POC HEMOCCULT BLD/STL (HOME/3-CARD/SCREEN): Card #2 Fecal Occult Blod, POC: NEGATIVE

## 2013-01-03 NOTE — Progress Notes (Signed)
Spoke with the patient that all of her tests were reassuring. The blood work all looks good. In addition to this her Hemoccult cards did not show any blood in her stool. Her pelvic ultrasound showed a very small fibroid but did not show any signs of any tumor or ovarian growths. Currently I would not recommend further testing but I do think it would be wise for her to followup in 4 weeks for a recheck. If she would like a copy of her blood work please send it to her. I am sending her the results and she will call back to schedule a f/u in 4 weeks. Pt verbalized understanding.

## 2013-03-28 ENCOUNTER — Other Ambulatory Visit: Payer: Self-pay | Admitting: *Deleted

## 2013-03-28 MED ORDER — LEVOTHYROXINE SODIUM 75 MCG PO TABS: 75.0000 ug | ORAL_TABLET | Freq: Every day | ORAL | Status: DC

## 2013-04-07 ENCOUNTER — Telehealth: Payer: Self-pay | Admitting: Family Medicine

## 2013-04-07 DIAGNOSIS — R7301 Impaired fasting glucose: Secondary | ICD-10-CM

## 2013-04-07 DIAGNOSIS — E039 Hypothyroidism, unspecified: Secondary | ICD-10-CM

## 2013-04-07 DIAGNOSIS — Z0189 Encounter for other specified special examinations: Secondary | ICD-10-CM

## 2013-04-07 NOTE — Telephone Encounter (Signed)
Repeat same labs that was done in November?

## 2013-04-07 NOTE — Telephone Encounter (Signed)
Notified patient's husband bloodwork has been ordered.

## 2013-04-07 NOTE — Telephone Encounter (Signed)
Lipid/TSH/HgA1C- with f/u ov

## 2013-04-07 NOTE — Addendum Note (Signed)
Addended by: Jesusita Oka on: 04/07/2013 01:35 PM   Modules accepted: Orders

## 2013-04-07 NOTE — Telephone Encounter (Signed)
Patient states its time for her 69mth checkup. Needs blood work papers.

## 2013-04-14 LAB — HEMOGLOBIN A1C
Hgb A1c MFr Bld: 6.2 % — ABNORMAL HIGH (ref <5.7)
Mean Plasma Glucose: 131 mg/dL — ABNORMAL HIGH (ref <117)

## 2013-04-14 LAB — LIPID PANEL
CHOL/HDL RATIO: 4.3 ratio
Cholesterol: 248 mg/dL — ABNORMAL HIGH (ref 0–200)
HDL: 58 mg/dL (ref >39)
LDL Cholesterol: 165 mg/dL — ABNORMAL HIGH (ref 0–99)
Triglycerides: 124 mg/dL (ref <150)
VLDL: 25 mg/dL (ref 0–40)

## 2013-04-14 LAB — TSH: TSH: 1.967 u[IU]/mL (ref 0.350–4.500)

## 2013-04-15 NOTE — Telephone Encounter (Signed)
Patient notified and verbalized understanding. Transferred up front to make appt.  

## 2013-04-16 ENCOUNTER — Other Ambulatory Visit: Payer: Self-pay | Admitting: *Deleted

## 2013-04-16 MED ORDER — OMEPRAZOLE 40 MG PO CPDR: 40.0000 mg | DELAYED_RELEASE_CAPSULE | Freq: Every day | ORAL | Status: DC

## 2013-04-21 ENCOUNTER — Ambulatory Visit (INDEPENDENT_AMBULATORY_CARE_PROVIDER_SITE_OTHER): Payer: Medicare Other | Admitting: Family Medicine

## 2013-04-21 ENCOUNTER — Encounter: Payer: Self-pay | Admitting: Family Medicine

## 2013-04-21 VITALS — BP 122/70 | Ht 62.0 in | Wt 138.0 lb

## 2013-04-21 DIAGNOSIS — K219 Gastro-esophageal reflux disease without esophagitis: Secondary | ICD-10-CM

## 2013-04-21 DIAGNOSIS — R7309 Other abnormal glucose: Secondary | ICD-10-CM

## 2013-04-21 DIAGNOSIS — R7303 Prediabetes: Secondary | ICD-10-CM

## 2013-04-21 DIAGNOSIS — E039 Hypothyroidism, unspecified: Secondary | ICD-10-CM

## 2013-04-21 DIAGNOSIS — R079 Chest pain, unspecified: Secondary | ICD-10-CM

## 2013-04-21 DIAGNOSIS — E785 Hyperlipidemia, unspecified: Secondary | ICD-10-CM

## 2013-04-21 MED ORDER — ALPRAZOLAM 0.25 MG PO TABS: 0.2500 mg | ORAL_TABLET | Freq: Two times a day (BID) | ORAL | Status: DC | PRN

## 2013-04-21 MED ORDER — ROSUVASTATIN CALCIUM 5 MG PO TABS: 5.0000 mg | ORAL_TABLET | Freq: Every day | ORAL | Status: DC

## 2013-04-21 NOTE — Progress Notes (Signed)
   Subjective:    Patient ID: Jordan Grant, female    DOB: [birth year], 73 y.o.   MRN: [id number]  HPI Patient is here today to go over BW.  We discussed at length the importance of getting cholesterol under control. She did not tolerate pravastatin. At one time she took Crestor and she thought it was making her arms and legs ache so she stopped taking them. After long discussion she agrees to restart medication. According to risk calculator she is at approximately 11% risk of serious cardiovascular event within the next 10 years. She understands it's important to get this under much better control.  She does try to watch how she eats but she worked relates that she takes and much more sweets than she should and because of this she does have some level of prediabetes. Patient does try to stay physically active she does a lot of walking. She walks a hilly path in the local community ( [place] but she does state that she went walking on a treadmill with incline for about 30 minutes and afterwards she experienced chest discomfort for 2-4 minutes did not break out in any sweats with it.  She does relate taking her thyroid medicine on a regular basis. She also states her reflux is under good control. She had Myoview several years back which was normal but she states it caused some discomfort when she had and therefore she really does not want to do another stress test if possible No concerns.     Review of Systems  Constitutional: Negative for activity change, appetite change and fatigue.  HENT: Negative for congestion, ear discharge and rhinorrhea.   Eyes: Negative for discharge.  Respiratory: Negative for cough, chest tightness and wheezing.   Cardiovascular: Positive for chest pain.  Gastrointestinal: Negative for vomiting and abdominal pain.  Genitourinary: Negative for frequency and difficulty urinating.  Musculoskeletal: Negative for neck pain.  Allergic/Immunologic: Negative  for environmental allergies and food allergies.  Neurological: Negative for weakness and headaches.  Psychiatric/Behavioral: Negative for behavioral problems and agitation.       Objective:   Physical Exam  Vitals reviewed. Constitutional: She appears well-nourished. No distress.  Cardiovascular: Normal rate, regular rhythm and normal heart sounds.   No murmur heard. Pulmonary/Chest: Effort normal and breath sounds normal. No respiratory distress.  Musculoskeletal: She exhibits no edema.  Lymphadenopathy:    She has no cervical adenopathy.  Neurological: She is alert. She exhibits normal muscle tone.  Psychiatric: Her behavior is normal.          Assessment & Plan:  #1 prediabetes no need to add medicines cut back on carbohydrates exercise on a regular basis to recheck A1c 3 months #2 hyperlipidemia restart Crestor 5 mg her start off just taking it 4 days a week recheck cholesterol panel in 8-10 weeks. If she tolerates taking the medicine 4 days per week then the next that would be to go to 7 days a week. Ideally LDL needs to be below 100. It would need to be below 70 if heart disease diagnosed. #3 concerning for possibility of stable angina exercise-induced referral to cardiology for stress test. #4 hypothyroidism continue medication good control

## 2013-04-21 NOTE — Patient Instructions (Signed)

## 2013-04-22 ENCOUNTER — Encounter: Payer: Self-pay | Admitting: Family Medicine

## 2013-05-09 ENCOUNTER — Ambulatory Visit (INDEPENDENT_AMBULATORY_CARE_PROVIDER_SITE_OTHER): Payer: Medicare Other | Admitting: Cardiology

## 2013-05-09 ENCOUNTER — Encounter: Payer: Self-pay | Admitting: Cardiology

## 2013-05-09 VITALS — BP 108/68 | HR 84 | Ht 62.0 in | Wt 137.0 lb

## 2013-05-09 DIAGNOSIS — R072 Precordial pain: Secondary | ICD-10-CM | POA: Insufficient documentation

## 2013-05-09 DIAGNOSIS — E785 Hyperlipidemia, unspecified: Secondary | ICD-10-CM

## 2013-05-09 NOTE — Progress Notes (Signed)
Clinical Summary Jordan Grant is a 73 y.o.female referred for cardiology consultation by Dr.Luking. She is typically fairly active, generally walks 2-3 miles each day on a local trail with her husband. She has been doing this for the last 4 years. Back in February with some of the snowy days, she exercised on a treadmill at home. She worked much harder than her usual exertion, tells me that she went between 2.5 and 3 miles per hour, alternating in two-minute intervals for up to 45 minutes, and by the end of that time had increased the treadmill incline to 10%. Following this activity she did experience some chest discomfort when she was sitting down later. She exercised again on the treadmill the next day for half the time and at less incline, and had some brief chest pain after this as well. She has had no further symptoms.  Recent lab work reviewed finding cholesterol 248, triglycerides 124, HDL 58, LDL 165, TSH 1.9, hemoglobin A1c 6.2.  Adenosine Myoview from October 2002 was negative for ischemia with LVEF 79%.  Recent ECG reviewed finding normal sinus rhythm.   Allergies  Allergen Reactions  . Codeine     Nausea, dizziness  . Darvocet [Propoxyphene N-Acetaminophen]     nausea  . Pravachol [Pravastatin Sodium]     Joint pain    Current Outpatient Prescriptions  Medication Sig Dispense Refill  . ALPRAZolam (XANAX) 0.25 MG tablet Take 1 tablet (0.25 mg total) by mouth 2 (two) times daily as needed for anxiety.  30 tablet  1  . levothyroxine (SYNTHROID, LEVOTHROID) 75 MCG tablet Take 1 tablet (75 mcg total) by mouth daily.  90 tablet  1  . omeprazole (PRILOSEC) 40 MG capsule Take 1 capsule (40 mg total) by mouth daily.  90 capsule  0  . RESTASIS 0.05 % ophthalmic emulsion       . rosuvastatin (CRESTOR) 5 MG tablet Take 1 tablet (5 mg total) by mouth at bedtime.  90 tablet  0   No current facility-administered medications for this visit.    Past Medical History  Diagnosis Date  .  Hyperlipidemia   . Hypothyroidism   . Diverticulosis 2007    History of diverticula  . GERD (gastroesophageal reflux disease)   . Prediabetes     Past Surgical History  Procedure Laterality Date  . Hemorroidectomy    . Appendectomy    . Cataract extraction w/ intraocular lens  implant, bilateral  12/09    Dr Corrin Parker bilateral  . Ml bone density  07/11  . Doppler echocardiography  06/2008    Leg  . Tubal ligation    . Colonoscopy    . Yag laser application Right 7/56/4332    Procedure: YAG LASER APPLICATION;  Surgeon: Elta Guadeloupe T. Gershon Crane, MD;  Location: [facility];  Service: Ophthalmology;  Laterality: Right;  . Yag laser application Left 9/51/8841    Procedure: YAG LASER APPLICATION;  Surgeon: Elta Guadeloupe T. Gershon Crane, MD;  Location: [facility];  Service: Ophthalmology;  Laterality: Left;    Family History  Problem Relation Age of Onset  . Stomach cancer Mother 23  . Breast cancer Maternal Aunt   . Colon cancer Neg Hx     Social History Jordan Grant reports that she has quit smoking. Her smoking use included Cigarettes. She smoked 0.00 packs per day. She has never used smokeless tobacco. Jordan Grant reports that she drinks about 1.0 ounces of alcohol per week.  Review of Systems Some recent back pain after  doing yard work. Otherwise negative.  Physical Examination Filed Vitals:   05/09/13 1037  BP: 108/68  Pulse: 84   Filed Weights   05/09/13 1037  Weight: 137 lb (62.143 kg)   Normally nourished appearing woman, looks younger than stated age, appears comfortable at rest. HEENT: Conjunctiva and lids normal, oropharynx clear. Neck: Supple, no elevated JVP or carotid bruits, no thyromegaly. Lungs: Clear to auscultation, nonlabored breathing at rest. Cardiac: Regular rate and rhythm, no S3 or significant systolic murmur, no pericardial rub. Abdomen: Soft, nontender, bowel sounds present. Extremities: No pitting edema, distal pulses 2+. Skin: Warm and dry. Musculoskeletal: No  kyphosis. Neuropsychiatric: Alert and oriented x3, affect grossly appropriate.   Problem List and Plan   Precordial pain Two episodes occurred after treadmill exercise back in February, no recurrence. She was exercising at higher workload than usual, did not have symptoms while she was on the treadmill however. She has no history of CAD, a negative Cardiolite in 2002. Cardiac risk factors include hyperlipidemia and prediabetes. Baseline ECG is normal. We discussed the matter and will plan a GXT for risk stratification. Unless this is significantly abnormal, would recommend observation for now. We will inform her of the results.  Hyperlipidemia On Crestor, recent LDL 165. Has had prior statin intolerance, recently back on medication.    Satira Sark, M.D., F.A.C.C.

## 2013-05-09 NOTE — Patient Instructions (Addendum)
Your physician recommends that you schedule a follow-up appointment to be determined,we will call you with test results   Your physician has requested that you have an exercise tolerance test. For further information please visit [email or web address]. Please also follow instruction sheet, as given.    Your physician recommends that you continue on your current medications as directed. Please refer to the Current Medication list given to you today.   Thank you for choosing [facility] !

## 2013-05-09 NOTE — Assessment & Plan Note (Signed)
On Crestor, recent LDL 165. Has had prior statin intolerance, recently back on medication.

## 2013-05-09 NOTE — Assessment & Plan Note (Signed)
Two episodes occurred after treadmill exercise back in February, no recurrence. She was exercising at higher workload than usual, did not have symptoms while she was on the treadmill however. She has no history of CAD, a negative Cardiolite in 2002. Cardiac risk factors include hyperlipidemia and prediabetes. Baseline ECG is normal. We discussed the matter and will plan a GXT for risk stratification. Unless this is significantly abnormal, would recommend observation for now. We will inform her of the results.

## 2013-05-17 ENCOUNTER — Encounter (HOSPITAL_COMMUNITY): Payer: Self-pay | Admitting: Emergency Medicine

## 2013-05-17 ENCOUNTER — Emergency Department (HOSPITAL_COMMUNITY): Payer: Medicare Other

## 2013-05-17 ENCOUNTER — Emergency Department (HOSPITAL_COMMUNITY)
Admission: EM | Admit: 2013-05-17 | Discharge: 2013-05-17 | Disposition: A | Discharge status: Home / Self Care | Payer: Medicare Other | Attending: Emergency Medicine | Admitting: Emergency Medicine

## 2013-05-17 DIAGNOSIS — Z87891 Personal history of nicotine dependence: Secondary | ICD-10-CM | POA: Insufficient documentation

## 2013-05-17 DIAGNOSIS — E039 Hypothyroidism, unspecified: Secondary | ICD-10-CM | POA: Insufficient documentation

## 2013-05-17 DIAGNOSIS — Z79899 Other long term (current) drug therapy: Secondary | ICD-10-CM | POA: Insufficient documentation

## 2013-05-17 DIAGNOSIS — K5900 Constipation, unspecified: Secondary | ICD-10-CM | POA: Insufficient documentation

## 2013-05-17 DIAGNOSIS — K219 Gastro-esophageal reflux disease without esophagitis: Secondary | ICD-10-CM | POA: Insufficient documentation

## 2013-05-17 DIAGNOSIS — R109 Unspecified abdominal pain: Secondary | ICD-10-CM | POA: Insufficient documentation

## 2013-05-17 DIAGNOSIS — E785 Hyperlipidemia, unspecified: Secondary | ICD-10-CM | POA: Insufficient documentation

## 2013-05-17 LAB — CBC WITH DIFFERENTIAL/PLATELET
BASOS ABS: 0 10*3/uL (ref 0.0–0.1)
BASOS PCT: 1 % (ref 0–1)
EOS ABS: 0.1 10*3/uL (ref 0.0–0.7)
EOS PCT: 2 % (ref 0–5)
HEMATOCRIT: 36.7 % (ref 36.0–46.0)
Hemoglobin: 12.1 g/dL (ref 12.0–15.0)
Lymphocytes Relative: 44 % (ref 12–46)
Lymphs Abs: 2.8 10*3/uL (ref 0.7–4.0)
MCH: 30.9 pg (ref 26.0–34.0)
MCHC: 33 g/dL (ref 30.0–36.0)
MCV: 93.9 fL (ref 78.0–100.0)
MONO ABS: 0.5 10*3/uL (ref 0.1–1.0)
Monocytes Relative: 8 % (ref 3–12)
NEUTROS ABS: 2.8 10*3/uL (ref 1.7–7.7)
Neutrophils Relative %: 45 % (ref 43–77)
Platelets: 244 10*3/uL (ref 150–400)
RBC: 3.91 MIL/uL (ref 3.87–5.11)
RDW: 12.4 % (ref 11.5–15.5)
WBC: 6.2 10*3/uL (ref 4.0–10.5)

## 2013-05-17 LAB — COMPREHENSIVE METABOLIC PANEL
ALT: 16 U/L (ref 0–35)
AST: 19 U/L (ref 0–37)
Albumin: 3.7 g/dL (ref 3.5–5.2)
Alkaline Phosphatase: 95 U/L (ref 39–117)
BUN: 14 mg/dL (ref 6–23)
CO2: 26 meq/L (ref 19–32)
Calcium: 9.2 mg/dL (ref 8.4–10.5)
Chloride: 102 mEq/L (ref 96–112)
Creatinine, Ser: 0.59 mg/dL (ref 0.50–1.10)
GFR calc non Af Amer: 88 mL/min — ABNORMAL LOW (ref >90)
GLUCOSE: 145 mg/dL — AB (ref 70–99)
Potassium: 3.4 mEq/L — ABNORMAL LOW (ref 3.7–5.3)
SODIUM: 140 meq/L (ref 137–147)
Total Bilirubin: 0.2 mg/dL — ABNORMAL LOW (ref 0.3–1.2)
Total Protein: 7.4 g/dL (ref 6.0–8.3)

## 2013-05-17 LAB — URINALYSIS, ROUTINE W REFLEX MICROSCOPIC
BILIRUBIN URINE: NEGATIVE
Glucose, UA: NEGATIVE mg/dL
Ketones, ur: NEGATIVE mg/dL
Nitrite: NEGATIVE
PROTEIN: NEGATIVE mg/dL
Specific Gravity, Urine: >1.03 — ABNORMAL HIGH (ref 1.005–1.030)
Urobilinogen, UA: 0.2 mg/dL (ref 0.0–1.0)
pH: 5.5 (ref 5.0–8.0)

## 2013-05-17 LAB — URINE MICROSCOPIC-ADD ON

## 2013-05-17 LAB — LIPASE, BLOOD: Lipase: 22 U/L (ref 11–59)

## 2013-05-17 MED ORDER — IOHEXOL 300 MG/ML  SOLN
50.0000 mL | Freq: Once | INTRAMUSCULAR | Status: AC | PRN
  Administered 2013-05-17: 50 mL via ORAL

## 2013-05-17 MED ORDER — TRAMADOL HCL 50 MG PO TABS
25.0000 mg | ORAL_TABLET | Freq: Once | ORAL | Status: AC
  Administered 2013-05-17: 25 mg via ORAL
  Filled 2013-05-17: qty 1

## 2013-05-17 MED ORDER — TRAMADOL HCL 50 MG PO TABS
50.0000 mg | ORAL_TABLET | Freq: Four times a day (QID) | ORAL | Status: DC | PRN
Start: 2013-05-17 — End: 2014-01-21

## 2013-05-17 NOTE — Discharge Instructions (Signed)

## 2013-05-17 NOTE — ED Notes (Addendum)
PT C/O RIGHT FLANK PAIN THAT RADIATES AROUND TO HER RLQ  X 3 WK BUT IS WORSE TODAY. PT ALSO STATES HER BACK HURTS WHEN SHE TAKES A DEEP BREATH.

## 2013-05-17 NOTE — ED Provider Notes (Signed)
CSN: [id number]     Arrival date & time 05/17/13  1956 History  This chart was scribed for Jordan Cable, MD by Jenne Campus, ED Scribe. This patient was seen in room [facility]19/[facility] and the patient's care was started at 9:15 PM.   Chief Complaint  Patient presents with  . Abdominal Pain     Patient is a 73 y.o. female presenting with back pain. The history is provided by the patient. No language interpreter was used.  Back Pain Location:  Thoracic spine (right side ) Radiates to: RLQ then RUQ. Onset quality:  Gradual Duration:  3 weeks Timing:  Constant Progression:  Worsening Chronicity:  New Worsened by:  Standing Associated symptoms: abdominal pain   Associated symptoms: no chest pain, no dysuria, no fever and no weakness     HPI Comments: Jordan Grant is a 73 y.o. female who presents to the Emergency Department complaining of right mid back pain that has been present for the past 3 weeks. She reports that the pain was initially radiating around to the RLQ but over the past week has begun to radiate around to the RUQ. She notes that the pain is worse with standing and deep breathing. She states that she had down a large amount of strenuous yard work at the time of the onset and had attributed the pain to a muscle strain. However, she became concerned when the pain started to worsen over the past couple of days. She reports being constipated over the past week and states that she has tried taking a laxative and enema with a small resulting BM yesterday. She also reports taking Aleve and one Tramadol pill around 4 hours ago with mild improvement. She denies any prior kidney stones or any recent falls or traumas. She denies any fevers, dysuria, CP, nausea or diarrhea as associated symptoms.   Past Medical History  Diagnosis Date  . Hyperlipidemia   . Hypothyroidism   . Diverticulosis 2007    History of diverticula  . GERD (gastroesophageal reflux disease)   . Prediabetes     Past Surgical History  Procedure Laterality Date  . Hemorroidectomy    . Appendectomy    . Cataract extraction w/ intraocular lens  implant, bilateral  12/09    Dr Corrin Parker bilateral  . Ml bone density  07/11  . Doppler echocardiography  06/2008    Leg  . Tubal ligation    . Colonoscopy    . Yag laser application Right 07/20/3708    Procedure: YAG LASER APPLICATION;  Surgeon: Elta Guadeloupe T. Gershon Crane, MD;  Location: [facility];  Service: Ophthalmology;  Laterality: Right;  . Yag laser application Left 08/09/9483    Procedure: YAG LASER APPLICATION;  Surgeon: Elta Guadeloupe T. Gershon Crane, MD;  Location: [facility];  Service: Ophthalmology;  Laterality: Left;   Family History  Problem Relation Age of Onset  . Stomach cancer Mother 47  . Breast cancer Maternal Aunt   . Colon cancer Neg Hx    History  Substance Use Topics  . Smoking status: Former Smoker    Types: Cigarettes  . Smokeless tobacco: Never Used  . Alcohol Use: 1.0 oz/week    2 drink(s) per week   No OB history provided.  Review of Systems  Constitutional: Negative for fever.  Respiratory: Negative for shortness of breath.   Cardiovascular: Negative for chest pain.  Gastrointestinal: Positive for abdominal pain.  Genitourinary: Negative for dysuria.  Musculoskeletal: Positive for back pain.  Neurological: Negative for weakness.  All other systems reviewed and are negative.   Allergies  Codeine; Darvocet; and Pravachol  Home Medications   Current Outpatient Rx  Name  Route  Sig  Dispense  Refill  . ALPRAZolam (XANAX) 0.25 MG tablet   Oral   Take 1 tablet (0.25 mg total) by mouth 2 (two) times daily as needed for anxiety.   30 tablet   1   . levothyroxine (SYNTHROID, LEVOTHROID) 75 MCG tablet   Oral   Take 1 tablet (75 mcg total) by mouth daily.   90 tablet   1   . omeprazole (PRILOSEC) 40 MG capsule   Oral   Take 1 capsule (40 mg total) by mouth daily.   90 capsule   0   . RESTASIS 0.05 % ophthalmic emulsion                . rosuvastatin (CRESTOR) 5 MG tablet   Oral   Take 1 tablet (5 mg total) by mouth at bedtime.   90 tablet   0    Triage Vitals: BP 126/62  Pulse 68  Temp(Src) 97.4 F (36.3 C) (Oral)  Resp 18  Ht 5\' 2"  (1.575 m)  Wt 135 lb (61.236 kg)  BMI 24.69 kg/m2  SpO2 93%  Physical Exam  Nursing note and vitals reviewed.  CONSTITUTIONAL: Well developed/well nourished HEAD: Normocephalic/atraumatic EYES: EOMI/PERRL ENMT: Mucous membranes moist NECK: supple no meningeal signs SPINE:entire spine nontender, No bruising/crepitance/stepoffs noted to spine CV: S1/S2 noted, no murmurs/rubs/gallops noted LUNGS: Lungs are clear to auscultation bilaterally, no apparent distress ABDOMEN: soft, nontender, no rebound or guarding, no RUQ tenderness noted GU: right cva tenderness NEURO: Pt is awake/alert, moves all extremitiesx4, pt is ambulatory without difficulty.  No ataxia.  No focal weakness in the lower extremities EXTREMITIES: pulses normal, full ROM SKIN: warm, color normal PSYCH: no abnormalities of mood noted  ED Course  Procedures   DIAGNOSTIC STUDIES: Oxygen Saturation is 93% on RA, adequate by my interpretation.    COORDINATION OF CARE: 9:22 PM-Informed pt of trace blood in the UA which could indicate a kidney stone. Discussed treatment plan which includes CT of abdomen with pt at bedside and pt agreed to plan. Pt requested Tramadol for pain relief.   11:07 PM CT negative for acute disease Pt is clinically well appearing and in no distress I feel she is safe for d/c home and f/u with PCP She requests tramadol for pain control   Labs Review Labs Reviewed  URINALYSIS, ROUTINE W REFLEX MICROSCOPIC - Abnormal; Notable for the following:    Specific Gravity, Urine >1.030 (*)    Hgb urine dipstick MODERATE (*)    Leukocytes, UA TRACE (*)    All other components within normal limits  URINE MICROSCOPIC-ADD ON - Abnormal; Notable for the following:    Squamous Epithelial  / LPF FEW (*)    All other components within normal limits  CBC WITH DIFFERENTIAL  COMPREHENSIVE METABOLIC PANEL  LIPASE, BLOOD   Imaging Review Ct Abdomen Pelvis Wo Contrast  05/17/2013   CLINICAL DATA:  Flank pain.  EXAM: CT ABDOMEN AND PELVIS WITHOUT CONTRAST  TECHNIQUE: Multidetector CT imaging of the abdomen and pelvis was performed following the standard protocol without intravenous contrast.  COMPARISON:  US TRANSVAGINAL NON-OB dated 12/26/2012; CT ABD - PELV W/ CM dated 08/25/2011  FINDINGS: Liver is unremarkable. Spleen is normal. Pancreas normal. No biliary distention. Gallbladder is nondistended.  Adrenals are normal. Simple cyst right kidney again noted. No evidence of  obstructing ureteral stone. The bladder is nondistended. Calcified uterine fibroids. No free pelvic fluid.  No adenopathy noted. Aorta normal caliber. Shotty retroperitoneal lymph nodes are noted.  No inflammatory change of the right or left pleural quadrant. Sigmoid colonic diverticulosis noted. No evidence of diverticulitis. Stool noted throughout the colon. There is no evidence of bowel obstruction. No free air.  Small umbilical hernia with herniation of fat only. Lung bases are clear. Mild atelectasis present. Heart size normal. No acute bony abnormality identified.  IMPRESSION: No acute abnormality. Specifically no evidence of obstructing ureteral stone.   Electronically Signed   By: Marcello Moores  Register   On: 05/17/2013 22:33      MDM   Final diagnoses:  Flank pain    Nursing notes including past medical history and social history reviewed and considered in documentation Labs/vital reviewed and considered   I personally performed the services described in this documentation, which was scribed in my presence. The recorded information has been reviewed and is accurate.      Jordan Cable, MD 05/17/13 2308

## 2013-05-19 ENCOUNTER — Encounter (HOSPITAL_COMMUNITY): Payer: Medicare Other

## 2013-05-19 ENCOUNTER — Encounter: Payer: Self-pay | Admitting: Family Medicine

## 2013-05-26 ENCOUNTER — Encounter: Payer: Self-pay | Admitting: Family Medicine

## 2013-05-26 ENCOUNTER — Ambulatory Visit (INDEPENDENT_AMBULATORY_CARE_PROVIDER_SITE_OTHER): Payer: Medicare Other | Admitting: Family Medicine

## 2013-05-26 VITALS — BP 100/70 | Temp 98.2°F | Ht 62.0 in | Wt 135.5 lb

## 2013-05-26 DIAGNOSIS — R109 Unspecified abdominal pain: Secondary | ICD-10-CM

## 2013-05-26 MED ORDER — HYOSCYAMINE SULFATE ER 0.375 MG PO TB12: 0.3750 mg | ORAL_TABLET | Freq: Two times a day (BID) | ORAL | Status: DC

## 2013-05-26 NOTE — Progress Notes (Signed)
   Subjective:    Patient ID: Jordan Grant, female    DOB: 09-Jan-1941, 73 y.o.   MRN: [id number]  Abdominal Pain This is a recurrent problem. The current episode started more than 1 month ago. The problem occurs intermittently. The problem has been unchanged. The pain is located in the generalized abdominal region. The pain is moderate. The quality of the pain is aching. The abdominal pain radiates to the back. Nothing aggravates the pain. The pain is relieved by nothing. Treatments tried: pain medicines. The treatment provided no relief.  Patient was seen at [facility] on Saturday for these symptoms. This patient's had these symptoms off and on several different times over the past couple years even went and saw gastroenterology saw the nurse practitioner they did not feel she needed a repeat of the colonoscopy. Patient denies any blood in her bowel movements. She relates more of abdominal pain and discomfort sometimes a strong grabbing pain in the lower abdomen with eating different items. She denies any slime in her bowel movements. She's had lab work before that did not show celiac disease. Patient's CT scan that was done over the weekend did not show any tumors or growths. She denies being depressed no fever or chills. Patient states that she has no other concerns at this time.    Review of Systems  Gastrointestinal: Positive for abdominal pain.   denies cough chest pain denies headaches denies sweats denies dysuria. Relates appetite okay moods are okay     Objective:   Physical Exam Neck no masses lungs are clear no crackles heart is regular pulses normal blood pressure is good abdomen is soft with subjective discomfort in the lower abdomen right lower quadrant       Assessment & Plan:  Abdominal discomfort-this patient CT overall looked good I don't feel she needs a pelvic ultrasound at this moment try Levbid 0.375  1/2 or whole twice daily on a regular basis. I recommend followup again  in 3-4 weeks. Hemoccult cards sedimentation rate plus also H. pylori antibody. Patient may end up needing to have referral back to gastroenterology await the results.

## 2013-05-27 LAB — H. PYLORI ANTIBODY, IGG: H PYLORI IGG: 0.89 {ISR}

## 2013-05-27 LAB — SEDIMENTATION RATE: SED RATE: 8 mm/h (ref 0–22)

## 2013-06-03 ENCOUNTER — Ambulatory Visit: Payer: Medicare Other | Admitting: Family Medicine

## 2013-06-13 LAB — POC HEMOCCULT BLD/STL (HOME/3-CARD/SCREEN)
Card #2 Fecal Occult Blod, POC: NEGATIVE
Card #3 Fecal Occult Blood, POC: NEGATIVE
FECAL OCCULT BLD: NEGATIVE

## 2013-07-21 ENCOUNTER — Telehealth: Payer: Self-pay | Admitting: Family Medicine

## 2013-07-21 ENCOUNTER — Other Ambulatory Visit: Payer: Self-pay | Admitting: *Deleted

## 2013-07-21 MED ORDER — OMEPRAZOLE 40 MG PO CPDR: 40.0000 mg | DELAYED_RELEASE_CAPSULE | Freq: Every day | ORAL | Status: DC

## 2013-07-21 NOTE — Telephone Encounter (Signed)
omeprazole (PRILOSEC) 40 MG capsule  Pt needs a refill please sent to   [facility]

## 2013-07-21 NOTE — Telephone Encounter (Signed)
Med sent to pharm. Pt notified.  

## 2013-08-25 ENCOUNTER — Telehealth: Payer: Self-pay | Admitting: Family Medicine

## 2013-08-25 DIAGNOSIS — E782 Mixed hyperlipidemia: Secondary | ICD-10-CM

## 2013-08-25 DIAGNOSIS — Z79899 Other long term (current) drug therapy: Secondary | ICD-10-CM

## 2013-08-25 DIAGNOSIS — R7301 Impaired fasting glucose: Secondary | ICD-10-CM

## 2013-08-25 NOTE — Telephone Encounter (Signed)
Lipid, TSH, A1C done on 04/14/13

## 2013-08-25 NOTE — Telephone Encounter (Signed)
bloodwork order ready. Pt notified.

## 2013-08-25 NOTE — Telephone Encounter (Signed)
Patient would like order for blood work.

## 2013-08-25 NOTE — Telephone Encounter (Signed)
Metabolic 7, lipid, liver, hemoglobin A 1C.

## 2013-08-26 LAB — LIPID PANEL
CHOL/HDL RATIO: 2.9 ratio
Cholesterol: 174 mg/dL (ref 0–200)
HDL: 61 mg/dL (ref >39)
LDL Cholesterol: 91 mg/dL (ref 0–99)
Triglycerides: 112 mg/dL (ref <150)
VLDL: 22 mg/dL (ref 0–40)

## 2013-08-26 LAB — BASIC METABOLIC PANEL
BUN: 14 mg/dL (ref 6–23)
CALCIUM: 9.5 mg/dL (ref 8.4–10.5)
CO2: 29 meq/L (ref 19–32)
Chloride: 104 mEq/L (ref 96–112)
Creat: 0.69 mg/dL (ref 0.50–1.10)
GLUCOSE: 101 mg/dL — AB (ref 70–99)
POTASSIUM: 4.8 meq/L (ref 3.5–5.3)
SODIUM: 141 meq/L (ref 135–145)

## 2013-08-26 LAB — HEPATIC FUNCTION PANEL
ALBUMIN: 4.1 g/dL (ref 3.5–5.2)
ALT: 16 U/L (ref 0–35)
AST: 18 U/L (ref 0–37)
Alkaline Phosphatase: 68 U/L (ref 39–117)
Bilirubin, Direct: 0.1 mg/dL (ref 0.0–0.3)
Indirect Bilirubin: 0.5 mg/dL (ref 0.2–1.2)
Total Bilirubin: 0.6 mg/dL (ref 0.2–1.2)
Total Protein: 7 g/dL (ref 6.0–8.3)

## 2013-08-26 LAB — HEMOGLOBIN A1C
Hgb A1c MFr Bld: 6 % — ABNORMAL HIGH (ref <5.7)
Mean Plasma Glucose: 126 mg/dL — ABNORMAL HIGH (ref <117)

## 2013-09-19 ENCOUNTER — Ambulatory Visit (INDEPENDENT_AMBULATORY_CARE_PROVIDER_SITE_OTHER): Payer: Medicare Other | Admitting: Family Medicine

## 2013-09-19 ENCOUNTER — Encounter: Payer: Self-pay | Admitting: Family Medicine

## 2013-09-19 VITALS — BP 110/70 | Ht 62.0 in | Wt 139.2 lb

## 2013-09-19 DIAGNOSIS — R7303 Prediabetes: Secondary | ICD-10-CM

## 2013-09-19 DIAGNOSIS — R7309 Other abnormal glucose: Secondary | ICD-10-CM

## 2013-09-19 DIAGNOSIS — E785 Hyperlipidemia, unspecified: Secondary | ICD-10-CM

## 2013-09-19 DIAGNOSIS — Z23 Encounter for immunization: Secondary | ICD-10-CM

## 2013-09-19 MED ORDER — ROSUVASTATIN CALCIUM 5 MG PO TABS: 5.0000 mg | ORAL_TABLET | Freq: Every day | ORAL | Status: DC

## 2013-09-19 NOTE — Progress Notes (Signed)
   Subjective:    Patient ID: Jordan Grant, female    DOB: June 12, 1940, 73 y.o.   MRN: [id number]  HPI Patient is here today for her follow up visit on hypothyroidism. Patient states that she is doing very well. Patient has no other concerns at this time.  We went overall of her blood work. Please see labs  Review of Systems Patient denies excessive thirst urination denies any chest tightness pressure pain denies nausea vomiting or diarrhea    Objective:   Physical Exam Her lungs are clear hearts regular pulse normal extremities no edema skin warm       Assessment & Plan:  #1 hyperlipidemia doing very good continue Crestor 5 mg she is currently taking it every other day repeat lab work again in 6 months HTN good control continue current measures watch diet closely stay physically active #3 hypothyroidism we will check lab work on next visit continue current medication #4 prediabetes watch his starches closely under decent control no need to add medication currently

## 2013-09-24 ENCOUNTER — Other Ambulatory Visit: Payer: Self-pay | Admitting: *Deleted

## 2013-09-24 MED ORDER — LEVOTHYROXINE SODIUM 75 MCG PO TABS: 75.0000 ug | ORAL_TABLET | Freq: Every day | ORAL | Status: DC

## 2013-11-18 ENCOUNTER — Other Ambulatory Visit: Payer: Self-pay | Admitting: Family Medicine

## 2013-11-18 DIAGNOSIS — Z1231 Encounter for screening mammogram for malignant neoplasm of breast: Secondary | ICD-10-CM

## 2013-12-08 ENCOUNTER — Other Ambulatory Visit: Payer: Self-pay | Admitting: *Deleted

## 2013-12-08 MED ORDER — OMEPRAZOLE 40 MG PO CPDR: 40.0000 mg | DELAYED_RELEASE_CAPSULE | Freq: Every day | ORAL | Status: DC

## 2013-12-11 ENCOUNTER — Ambulatory Visit (HOSPITAL_COMMUNITY)
Admission: RE | Admit: 2013-12-11 | Discharge: 2013-12-11 | Disposition: A | Discharge status: Home / Self Care | Payer: Medicare Other | Source: Ambulatory Visit | Attending: Family Medicine | Admitting: Family Medicine

## 2013-12-11 DIAGNOSIS — Z1231 Encounter for screening mammogram for malignant neoplasm of breast: Secondary | ICD-10-CM

## 2014-01-12 ENCOUNTER — Telehealth: Payer: Self-pay | Admitting: Family Medicine

## 2014-01-12 DIAGNOSIS — Z79899 Other long term (current) drug therapy: Secondary | ICD-10-CM

## 2014-01-12 DIAGNOSIS — E119 Type 2 diabetes mellitus without complications: Secondary | ICD-10-CM

## 2014-01-12 DIAGNOSIS — E785 Hyperlipidemia, unspecified: Secondary | ICD-10-CM

## 2014-01-12 DIAGNOSIS — R5382 Chronic fatigue, unspecified: Secondary | ICD-10-CM

## 2014-01-12 NOTE — Telephone Encounter (Signed)
Pt has appt 12/9 does she need BW?   Call pt when sent

## 2014-01-12 NOTE — Telephone Encounter (Signed)
Last labs 08/26/13 Lipid, Liver, BMP, A1C

## 2014-01-12 NOTE — Telephone Encounter (Signed)
Blood work ordered in Epic. 

## 2014-01-12 NOTE — Telephone Encounter (Signed)
Left message on voicemail notifying patient that blood work has been ordered.  

## 2014-01-12 NOTE — Telephone Encounter (Signed)
Same labs and tsh

## 2014-01-13 LAB — HEPATIC FUNCTION PANEL
ALBUMIN: 4 g/dL (ref 3.5–5.2)
ALK PHOS: 81 U/L (ref 39–117)
ALT: 17 U/L (ref 0–35)
AST: 19 U/L (ref 0–37)
BILIRUBIN INDIRECT: 0.4 mg/dL (ref 0.2–1.2)
Bilirubin, Direct: 0.1 mg/dL (ref 0.0–0.3)
TOTAL PROTEIN: 7.1 g/dL (ref 6.0–8.3)
Total Bilirubin: 0.5 mg/dL (ref 0.2–1.2)

## 2014-01-13 LAB — HEMOGLOBIN A1C
HEMOGLOBIN A1C: 6.2 % — AB (ref <5.7)
Mean Plasma Glucose: 131 mg/dL — ABNORMAL HIGH (ref <117)

## 2014-01-13 LAB — LIPID PANEL
CHOLESTEROL: 186 mg/dL (ref 0–200)
HDL: 64 mg/dL (ref >39)
LDL Cholesterol: 102 mg/dL — ABNORMAL HIGH (ref 0–99)
Total CHOL/HDL Ratio: 2.9 Ratio
Triglycerides: 102 mg/dL (ref <150)
VLDL: 20 mg/dL (ref 0–40)

## 2014-01-13 LAB — BASIC METABOLIC PANEL
BUN: 14 mg/dL (ref 6–23)
CALCIUM: 9.1 mg/dL (ref 8.4–10.5)
CHLORIDE: 105 meq/L (ref 96–112)
CO2: 28 mEq/L (ref 19–32)
CREATININE: 0.71 mg/dL (ref 0.50–1.10)
Glucose, Bld: 102 mg/dL — ABNORMAL HIGH (ref 70–99)
Potassium: 4.7 mEq/L (ref 3.5–5.3)
Sodium: 141 mEq/L (ref 135–145)

## 2014-01-13 LAB — TSH: TSH: 1.501 u[IU]/mL (ref 0.350–4.500)

## 2014-01-21 ENCOUNTER — Encounter: Payer: Self-pay | Admitting: Family Medicine

## 2014-01-21 ENCOUNTER — Ambulatory Visit (INDEPENDENT_AMBULATORY_CARE_PROVIDER_SITE_OTHER): Payer: Medicare Other | Admitting: Family Medicine

## 2014-01-21 VITALS — BP 128/76 | Ht 62.0 in | Wt 142.0 lb

## 2014-01-21 DIAGNOSIS — K219 Gastro-esophageal reflux disease without esophagitis: Secondary | ICD-10-CM

## 2014-01-21 DIAGNOSIS — R7309 Other abnormal glucose: Secondary | ICD-10-CM

## 2014-01-21 DIAGNOSIS — Z13820 Encounter for screening for osteoporosis: Secondary | ICD-10-CM

## 2014-01-21 DIAGNOSIS — M25552 Pain in left hip: Secondary | ICD-10-CM

## 2014-01-21 DIAGNOSIS — R7303 Prediabetes: Secondary | ICD-10-CM

## 2014-01-21 DIAGNOSIS — E785 Hyperlipidemia, unspecified: Secondary | ICD-10-CM

## 2014-01-21 MED ORDER — ALPRAZOLAM 0.25 MG PO TABS: 0.2500 mg | ORAL_TABLET | Freq: Two times a day (BID) | ORAL | Status: DC | PRN

## 2014-01-21 MED ORDER — LEVOTHYROXINE SODIUM 75 MCG PO TABS: 75.0000 ug | ORAL_TABLET | Freq: Every day | ORAL | Status: DC

## 2014-01-21 MED ORDER — TRAMADOL HCL 50 MG PO TABS: 50.0000 mg | ORAL_TABLET | Freq: Four times a day (QID) | ORAL | Status: DC | PRN

## 2014-01-21 NOTE — Progress Notes (Signed)
   Subjective:    Patient ID: Jordan Grant, female    DOB: Nov 27, 1940, 73 y.o.   MRN: [id number]  Hyperlipidemia This is a chronic problem. The current episode started more than 1 year ago. Pertinent negatives include no chest pain or shortness of breath. There are no compliance problems.   Had bloodwork on 01/12/14.    Having reflux. Taking omeprazole 40mg  qam.    Having low back pain on left side and left hip pain. Wakes her up at night. Off and on. Difficult to turn over Hurts with more activity Occasional sciatica Tramadol helped a little   Needs refills on tradamol and xanax. Pt would like printed copy to take with her.   Greater than 25 minutes spent with patient covering multitude problems  Review of Systems  Constitutional: Negative for activity change, appetite change and fatigue.  HENT: Negative for congestion.   Respiratory: Negative for cough and shortness of breath.   Cardiovascular: Negative for chest pain.  Gastrointestinal: Negative for abdominal pain.  Endocrine: Negative for polydipsia and polyphagia.  Genitourinary: Negative for frequency.  Neurological: Negative for weakness.  Psychiatric/Behavioral: Negative for confusion.       Objective:   Physical Exam  Constitutional: She appears well-nourished. No distress.  Cardiovascular: Normal rate, regular rhythm and normal heart sounds.   No murmur heard. Pulmonary/Chest: Effort normal and breath sounds normal. No respiratory distress.  Musculoskeletal: She exhibits no edema.  Lymphadenopathy:    She has no cervical adenopathy.  Neurological: She is alert. She exhibits normal muscle tone.  Psychiatric: Her behavior is normal.  Vitals reviewed.         Assessment & Plan:  Hip - tramadol intermittently, she has known history of arthritis in both hips but now the left hip is bothering her more. Hurts with certain movements and walking. But she doesn't feel it's bad enough to want x-rays  Patient  is due for bone density testing  crestor qod tolerating well, she did not tolerating Crestor daily taking it every other day she has tolerated it well.  Thyroid stable continue current dosing  Reflux stable minimize past does and other tomato based products  Prediabetes- careful with starches, we will need to follow the A1c. She needs to do a good job of minimizing starches in her diet. Stay physically active. Minimize sweets as well  Patient had originally made an appointment because of mid back pain that woke her up a week or so ago but she states by the morning after she called the pain is gone and she has not experienced any since.

## 2014-01-26 ENCOUNTER — Ambulatory Visit (HOSPITAL_COMMUNITY)
Admission: RE | Admit: 2014-01-26 | Discharge: 2014-01-26 | Disposition: A | Discharge status: Home / Self Care | Payer: Medicare Other | Source: Ambulatory Visit | Attending: Family Medicine | Admitting: Family Medicine

## 2014-01-26 DIAGNOSIS — Z13820 Encounter for screening for osteoporosis: Secondary | ICD-10-CM | POA: Insufficient documentation

## 2014-01-26 DIAGNOSIS — Z780 Asymptomatic menopausal state: Secondary | ICD-10-CM | POA: Diagnosis not present

## 2014-02-27 ENCOUNTER — Telehealth: Payer: Self-pay | Admitting: Family Medicine

## 2014-02-27 NOTE — Telephone Encounter (Signed)
Patient wants you personally to call her she states has a personal question to ask you and its not about her.

## 2014-02-27 NOTE — Telephone Encounter (Signed)
Patient does not want to talk to nurse and is aware Dr Nicki Reaper out of office till 03/04/14

## 2014-03-09 ENCOUNTER — Telehealth: Payer: Self-pay | Admitting: Family Medicine

## 2014-03-09 MED ORDER — OMEPRAZOLE 40 MG PO CPDR: 40.0000 mg | DELAYED_RELEASE_CAPSULE | Freq: Every day | ORAL | Status: DC

## 2014-03-09 NOTE — Telephone Encounter (Signed)
Patient notified medication sent to pharmacy. Patient states that she needs Dr. Nicki Reaper to please give her call. (See phone message 02/27/14). Personal issue only wants to talk with the doctor.

## 2014-03-09 NOTE — Telephone Encounter (Signed)
She was interested in [place] help taking care of a 75 year old friend she has. Currently our practice has been closed to new patients I explained this to her. I recommended Dr. Nevada Crane internal medicine.

## 2014-03-09 NOTE — Telephone Encounter (Signed)
omeprazole (PRILOSEC) 40 MG capsule  Pt has switched pharmacy to [facility] mail order  90 day supply please an note this as the new pharmacy for  Her mail orders

## 2014-03-09 NOTE — Telephone Encounter (Signed)
I discussed her issue with her. She had a friend who needed a new doctor. I explained Dr. Nevada Crane would be a good internal medicine doctor. I explained why we are closed new patients.

## 2014-05-05 ENCOUNTER — Other Ambulatory Visit: Payer: Self-pay | Admitting: Family Medicine

## 2014-05-05 NOTE — Telephone Encounter (Signed)
Last seen 03/13/13.

## 2014-05-06 NOTE — Telephone Encounter (Signed)
May fill with 3 refills

## 2014-05-27 ENCOUNTER — Other Ambulatory Visit: Payer: Self-pay | Admitting: *Deleted

## 2014-05-27 ENCOUNTER — Telehealth: Payer: Self-pay | Admitting: Family Medicine

## 2014-05-27 MED ORDER — LEVOTHYROXINE SODIUM 75 MCG PO TABS
75.0000 ug | ORAL_TABLET | Freq: Every day | ORAL | Status: DC
Start: 2014-05-27 — End: 2014-11-04

## 2014-05-27 NOTE — Telephone Encounter (Signed)
Pt needs a 90 day supply of her levothyroxine [facility]

## 2014-05-27 NOTE — Telephone Encounter (Signed)
Med sent to pharm. Left message notifying pt.

## 2014-05-29 ENCOUNTER — Other Ambulatory Visit: Payer: Self-pay | Admitting: *Deleted

## 2014-05-29 MED ORDER — OMEPRAZOLE 40 MG PO CPDR: 40.0000 mg | DELAYED_RELEASE_CAPSULE | Freq: Every day | ORAL | Status: DC

## 2014-07-27 IMAGING — MG MM DIGITAL SCREENING BILAT
4 series · 4 of 4 positions shown · non-contrast
Comparison: Previous exams.

CLINICAL DATA: Screening.

DIGITAL BILATERAL SCREENING MAMMOGRAM WITH CAD

[L CC]
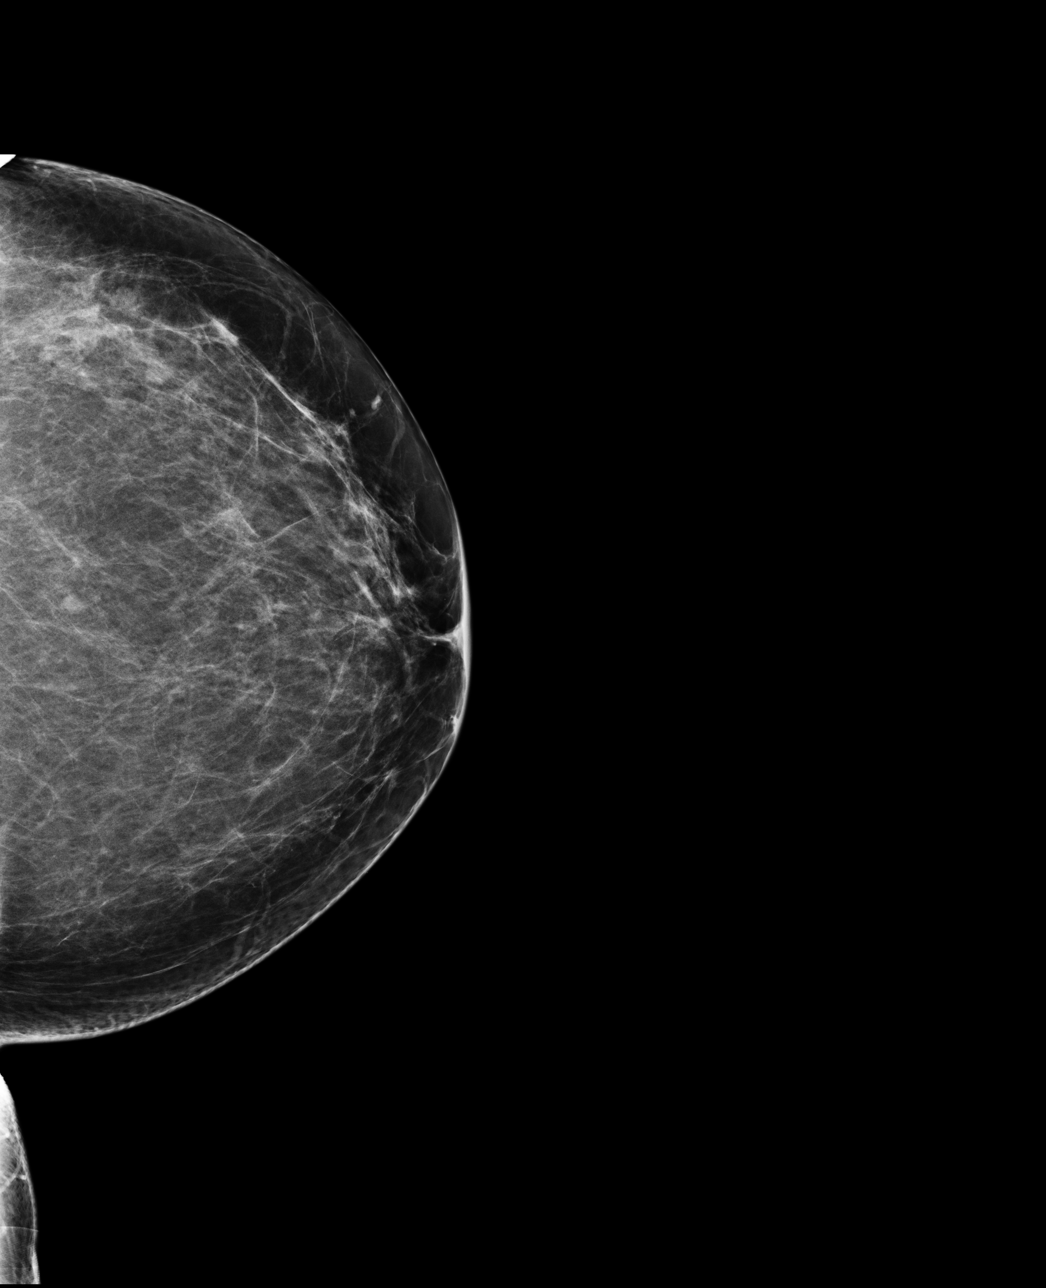

[L MLO]
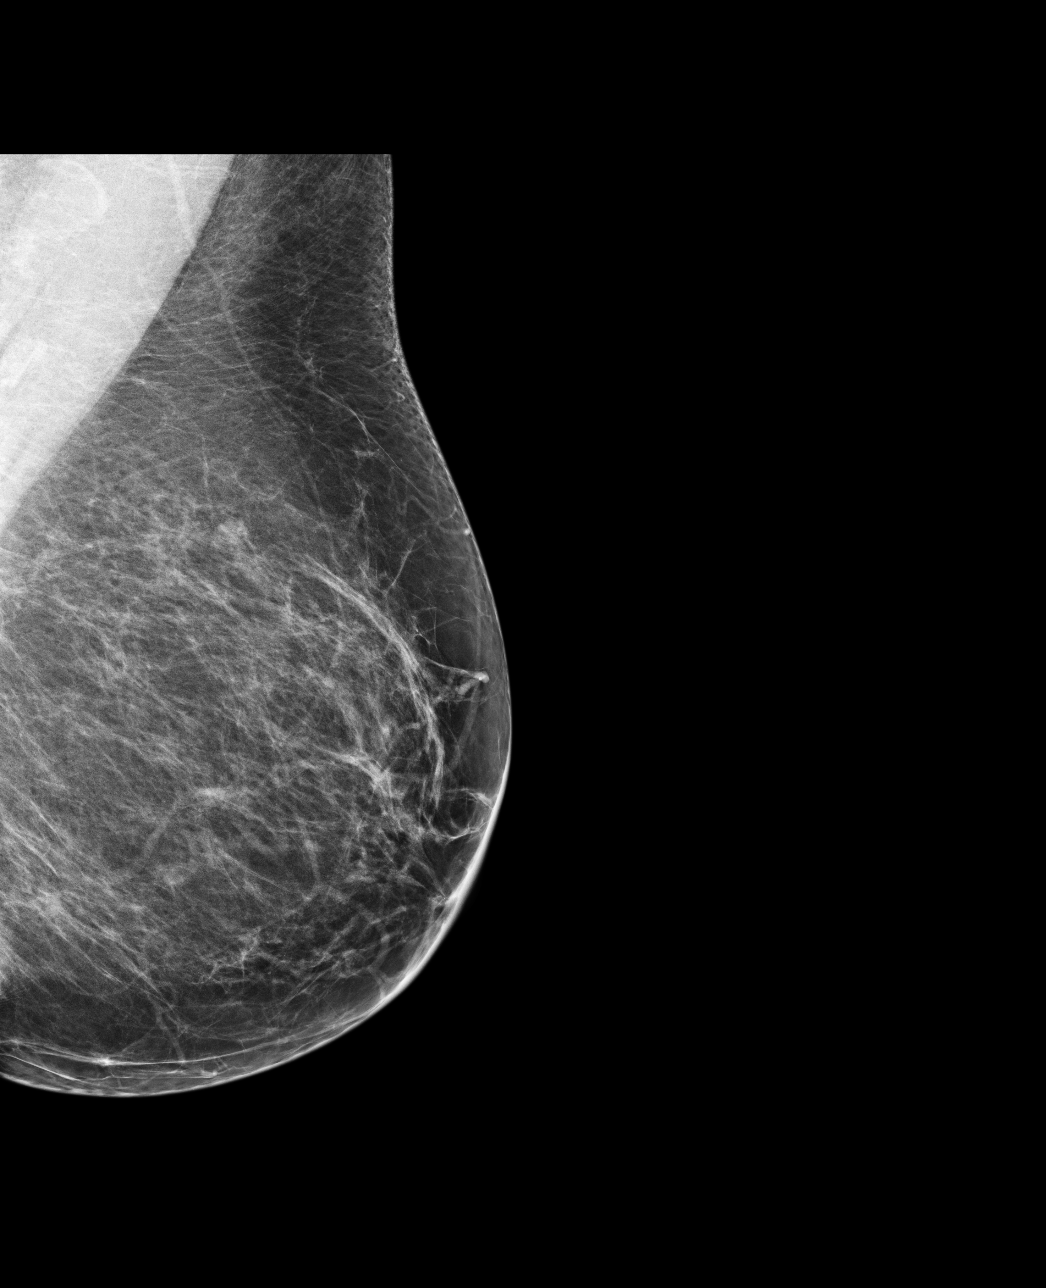

[R CC]
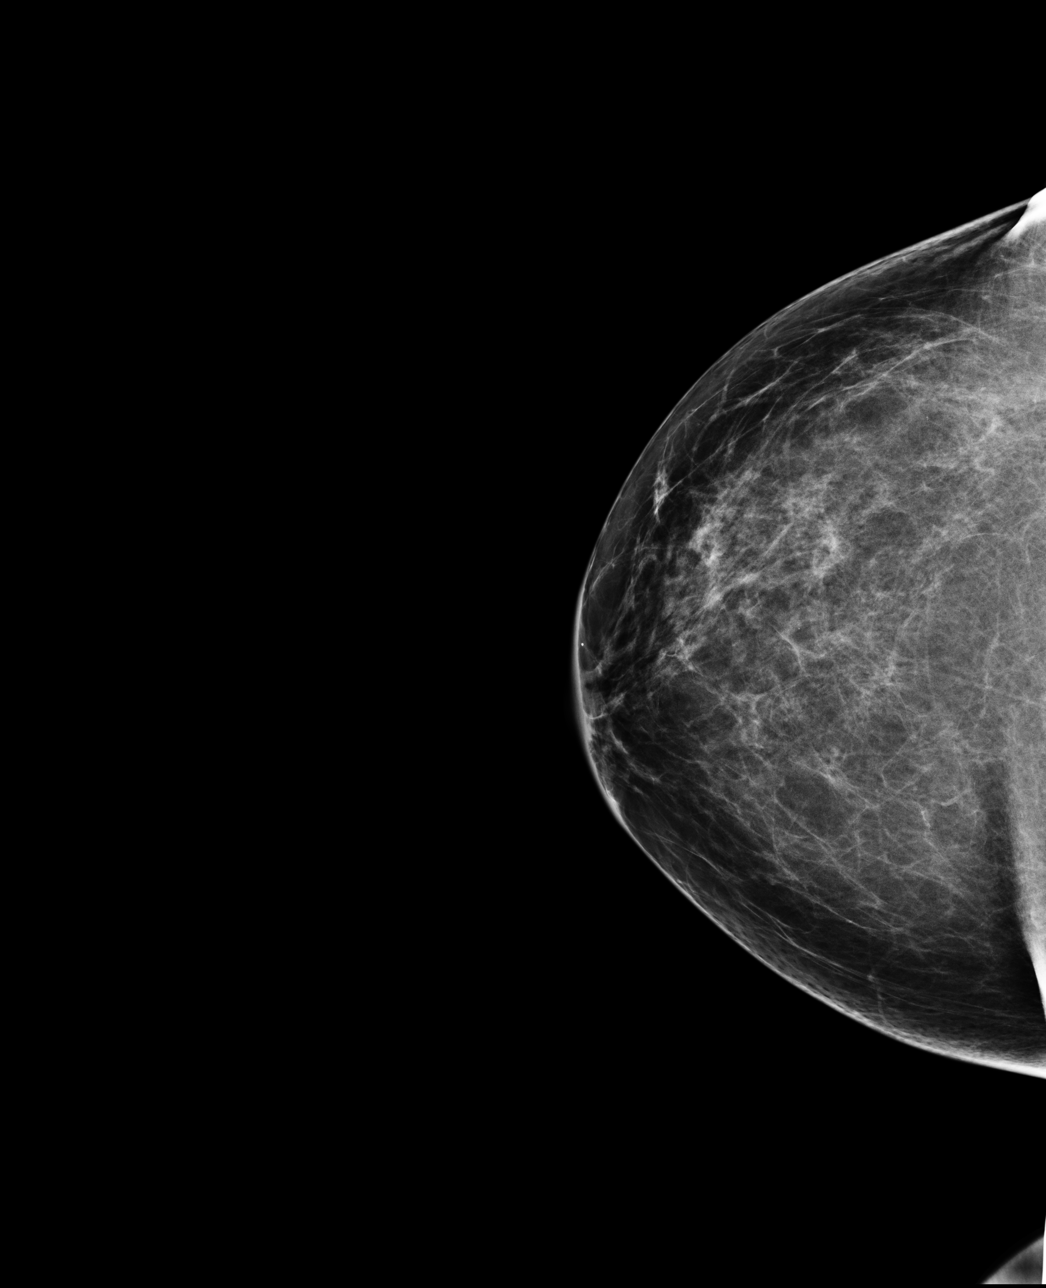

[R MLO]
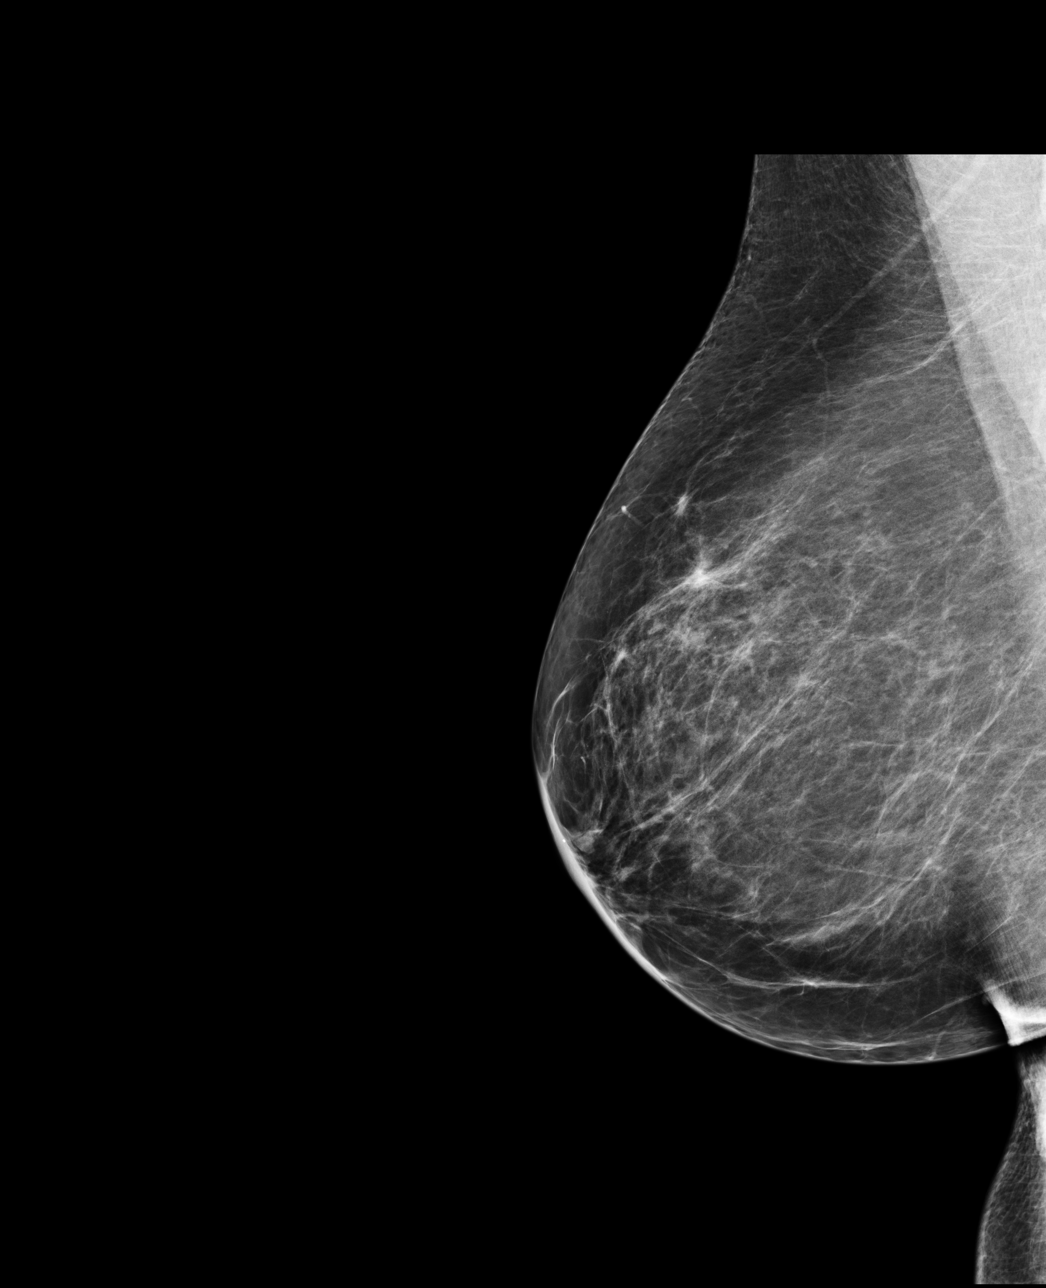

[4 of 4 positions shown; findings below may reference images not displayed]

FINDINGS: There are scattered fibroglandular densities. No
suspicious masses, architectural distortion, or calcifications are
present.

Images were processed with CAD.
IMPRESSION: No mammographic evidence of malignancy.

A result letter of this screening mammogram will be mailed directly
to the patient.

RECOMMENDATION:
Screening mammogram in one year. (Code:FV-O-IDP)

BI-RADS CATEGORY 1:  Negative.

## 2014-09-03 ENCOUNTER — Telehealth: Payer: Self-pay | Admitting: Family Medicine

## 2014-09-03 DIAGNOSIS — Z13220 Encounter for screening for lipoid disorders: Secondary | ICD-10-CM

## 2014-09-03 DIAGNOSIS — E785 Hyperlipidemia, unspecified: Secondary | ICD-10-CM

## 2014-09-03 DIAGNOSIS — E039 Hypothyroidism, unspecified: Secondary | ICD-10-CM

## 2014-09-03 DIAGNOSIS — Z79899 Other long term (current) drug therapy: Secondary | ICD-10-CM

## 2014-09-03 DIAGNOSIS — R7303 Prediabetes: Secondary | ICD-10-CM

## 2014-09-03 NOTE — Telephone Encounter (Signed)
Lipid, liver, metabolic 7, hemoglobin A1c, TSH 

## 2014-09-03 NOTE — Telephone Encounter (Signed)
Pt is requesting lab orders to be sent over. Last labs per epic were: Lipid Hepatic BMP A1c TSH on 01/12/14

## 2014-09-03 NOTE — Telephone Encounter (Signed)
[facility] 09/03/14. Labs ordered in epic.

## 2014-09-03 NOTE — Telephone Encounter (Signed)
Spoke with patient and informed patient that labs have been ordered per Dr.Scott and that we now use [facility] located in the same building that we are in. Patient verbalized understanding.

## 2014-09-06 ENCOUNTER — Encounter: Payer: Self-pay | Admitting: Family Medicine

## 2014-09-06 LAB — HEMOGLOBIN A1C
ESTIMATED AVERAGE GLUCOSE: 134 mg/dL
Hgb A1c MFr Bld: 6.3 % — ABNORMAL HIGH (ref 4.8–5.6)

## 2014-09-06 LAB — HEPATIC FUNCTION PANEL
ALT: 12 IU/L (ref 0–32)
AST: 19 IU/L (ref 0–40)
Albumin: 4.3 g/dL (ref 3.5–4.8)
Alkaline Phosphatase: 81 IU/L (ref 39–117)
BILIRUBIN, DIRECT: 0.11 mg/dL (ref 0.00–0.40)
Bilirubin Total: 0.4 mg/dL (ref 0.0–1.2)
Total Protein: 6.9 g/dL (ref 6.0–8.5)

## 2014-09-06 LAB — LIPID PANEL
Chol/HDL Ratio: 3.2 ratio units (ref 0.0–4.4)
Cholesterol, Total: 186 mg/dL (ref 100–199)
HDL: 59 mg/dL (ref >39)
LDL CALC: 105 mg/dL — AB (ref 0–99)
Triglycerides: 112 mg/dL (ref 0–149)
VLDL Cholesterol Cal: 22 mg/dL (ref 5–40)

## 2014-09-06 LAB — TSH: TSH: 1.71 u[IU]/mL (ref 0.450–4.500)

## 2014-09-06 LAB — BASIC METABOLIC PANEL
BUN/Creatinine Ratio: 17 (ref 11–26)
BUN: 11 mg/dL (ref 8–27)
CALCIUM: 9.3 mg/dL (ref 8.7–10.3)
CO2: 26 mmol/L (ref 18–29)
CREATININE: 0.65 mg/dL (ref 0.57–1.00)
Chloride: 99 mmol/L (ref 97–108)
GFR calc Af Amer: 100 mL/min/{1.73_m2} (ref >59)
GFR, EST NON AFRICAN AMERICAN: 87 mL/min/{1.73_m2} (ref >59)
GLUCOSE: 102 mg/dL — AB (ref 65–99)
Potassium: 4.3 mmol/L (ref 3.5–5.2)
Sodium: 142 mmol/L (ref 134–144)

## 2014-10-01 ENCOUNTER — Ambulatory Visit (HOSPITAL_COMMUNITY)
Admission: RE | Admit: 2014-10-01 | Discharge: 2014-10-01 | Disposition: A | Discharge status: Home / Self Care | Payer: PPO | Source: Ambulatory Visit | Attending: Family Medicine | Admitting: Family Medicine

## 2014-10-01 ENCOUNTER — Other Ambulatory Visit: Payer: Self-pay | Admitting: Family Medicine

## 2014-10-01 ENCOUNTER — Encounter: Payer: Self-pay | Admitting: Family Medicine

## 2014-10-01 ENCOUNTER — Ambulatory Visit (INDEPENDENT_AMBULATORY_CARE_PROVIDER_SITE_OTHER): Payer: PPO | Admitting: Family Medicine

## 2014-10-01 VITALS — BP 118/78 | Temp 98.4°F | Ht 62.0 in | Wt 140.0 lb

## 2014-10-01 DIAGNOSIS — E038 Other specified hypothyroidism: Secondary | ICD-10-CM | POA: Diagnosis not present

## 2014-10-01 DIAGNOSIS — J302 Other seasonal allergic rhinitis: Secondary | ICD-10-CM

## 2014-10-01 DIAGNOSIS — R7309 Other abnormal glucose: Secondary | ICD-10-CM

## 2014-10-01 DIAGNOSIS — E785 Hyperlipidemia, unspecified: Secondary | ICD-10-CM | POA: Diagnosis not present

## 2014-10-01 DIAGNOSIS — R05 Cough: Secondary | ICD-10-CM | POA: Insufficient documentation

## 2014-10-01 DIAGNOSIS — R059 Cough, unspecified: Secondary | ICD-10-CM

## 2014-10-01 DIAGNOSIS — R7303 Prediabetes: Secondary | ICD-10-CM

## 2014-10-01 MED ORDER — FLUTICASONE PROPIONATE 50 MCG/ACT NA SUSP: 2.0000 | Freq: Every day | NASAL | Status: DC

## 2014-10-01 MED ORDER — TRAMADOL HCL 50 MG PO TABS: ORAL_TABLET | ORAL | Status: DC

## 2014-10-01 NOTE — Progress Notes (Signed)
   Subjective:    Patient ID: Jordan Grant, female    DOB: 1940-12-03, 74 y.o.   MRN: [id number]  Cough This is a new problem. The current episode started 1 to 4 weeks ago. Associated symptoms include nasal congestion and rhinorrhea. Pertinent negatives include no chest pain, ear pain, fever, shortness of breath or wheezing.   Present for the psat few weeks present day and night  Patient denies fever chills sweats sinus pressure pain she does walk outside a lot tries to stay healthy no weight loss Patient also has prediabetes she does try to watch her diet as best she can She has hypothyroidism she takes her medicine on a regular basis She does uses Xanax occasionally at nighttime for insomnia She also uses tramadol as necessary for arthralgias She also uses omeprazole to control her reflux and states this is been doing good She does relate sinus issues having going on Review of Systems  Constitutional: Negative for fever and activity change.  HENT: Positive for congestion and rhinorrhea. Negative for ear pain.   Eyes: Negative for discharge.  Respiratory: Positive for cough. Negative for shortness of breath and wheezing.   Cardiovascular: Negative for chest pain.       Objective:   Physical Exam  Constitutional: She appears well-developed.  HENT:  Head: Normocephalic.  Nose: Nose normal.  Mouth/Throat: Oropharynx is clear and moist. No oropharyngeal exudate.  Neck: Neck supple.  Cardiovascular: Normal rate and normal heart sounds.   No murmur heard. Pulmonary/Chest: Effort normal and breath sounds normal. She has no wheezes.  Lymphadenopathy:    She has no cervical adenopathy.  Skin: Skin is warm and dry.  Nursing note and vitals reviewed.         Assessment & Plan:  Cough-I believe that this is due to allergic rhinitis component we will treat that aggressively over the next few weeks if the cough goes away no further intervention if the cough is not getting better  she needs to let us know and we would pursue pulmonary function testing and possible referral patient does have allergic rhinitis she also has some history of  reflux but we will do a chest x-ray patient quit smoking 30 years ago  Hyperlipidemia actually looks much better she is using Crestor 5 mg every other day  Liver function looks good Fasting hyperglycemia watch diet closely stay active A1c 6.3 recheck in 6 months Thyroid good control continue current measures Multiple issues covered greater than 25 minutes [id number] recheck 6 months

## 2014-11-04 ENCOUNTER — Other Ambulatory Visit: Payer: Self-pay

## 2014-11-04 MED ORDER — LEVOTHYROXINE SODIUM 75 MCG PO TABS: 75.0000 ug | ORAL_TABLET | Freq: Every day | ORAL | Status: DC

## 2014-11-04 MED ORDER — OMEPRAZOLE 40 MG PO CPDR: 40.0000 mg | DELAYED_RELEASE_CAPSULE | Freq: Every day | ORAL | Status: DC

## 2014-11-10 ENCOUNTER — Other Ambulatory Visit: Payer: Self-pay | Admitting: Family Medicine

## 2014-11-10 DIAGNOSIS — Z1231 Encounter for screening mammogram for malignant neoplasm of breast: Secondary | ICD-10-CM

## 2014-12-14 ENCOUNTER — Ambulatory Visit (HOSPITAL_COMMUNITY)
Admission: RE | Admit: 2014-12-14 | Discharge: 2014-12-14 | Disposition: A | Discharge status: Home / Self Care | Payer: PPO | Source: Ambulatory Visit | Attending: Family Medicine | Admitting: Family Medicine

## 2014-12-14 DIAGNOSIS — Z1231 Encounter for screening mammogram for malignant neoplasm of breast: Secondary | ICD-10-CM | POA: Diagnosis present

## 2015-01-04 ENCOUNTER — Telehealth: Payer: Self-pay | Admitting: Family Medicine

## 2015-01-04 MED ORDER — ROSUVASTATIN CALCIUM 5 MG PO TABS: 5.0000 mg | ORAL_TABLET | Freq: Every day | ORAL | Status: DC

## 2015-01-04 NOTE — Telephone Encounter (Signed)
Rx sent electronically to pharmacy. Patient notified. 

## 2015-01-04 NOTE — Telephone Encounter (Signed)
Patient called for new prescription for crestor 5mg  but was not on her med list . She states that at her last appointment(10/01/14) you discuss that she keep taking it .She has switch mail order and needing new prescription.

## 2015-01-04 NOTE — Telephone Encounter (Signed)
Crestor 5 mg, #90, 3 refills, 1 daily

## 2015-02-24 ENCOUNTER — Other Ambulatory Visit: Payer: Self-pay | Admitting: Family Medicine

## 2015-02-24 NOTE — Telephone Encounter (Signed)
May have this and 3 refills 

## 2015-03-10 ENCOUNTER — Telehealth: Payer: Self-pay | Admitting: Family Medicine

## 2015-03-10 DIAGNOSIS — E785 Hyperlipidemia, unspecified: Secondary | ICD-10-CM

## 2015-03-10 DIAGNOSIS — Z79899 Other long term (current) drug therapy: Secondary | ICD-10-CM

## 2015-03-10 DIAGNOSIS — E039 Hypothyroidism, unspecified: Secondary | ICD-10-CM

## 2015-03-10 DIAGNOSIS — R739 Hyperglycemia, unspecified: Secondary | ICD-10-CM

## 2015-03-10 NOTE — Telephone Encounter (Signed)
Same labs as 09/04/14

## 2015-03-10 NOTE — Telephone Encounter (Signed)
LMRC

## 2015-03-10 NOTE — Telephone Encounter (Signed)
Pt is requesting labs to be sent over for an upcoming appt. Last labs per epic were: lipid,hepatic,bmp,a1c and tsh on 09/04/14

## 2015-03-12 NOTE — Telephone Encounter (Signed)
Blood work ordered in EPIC. Patient notified. 

## 2015-03-19 DIAGNOSIS — Z79899 Other long term (current) drug therapy: Secondary | ICD-10-CM | POA: Diagnosis not present

## 2015-03-19 DIAGNOSIS — E039 Hypothyroidism, unspecified: Secondary | ICD-10-CM | POA: Diagnosis not present

## 2015-03-19 DIAGNOSIS — R739 Hyperglycemia, unspecified: Secondary | ICD-10-CM | POA: Diagnosis not present

## 2015-03-19 DIAGNOSIS — E785 Hyperlipidemia, unspecified: Secondary | ICD-10-CM | POA: Diagnosis not present

## 2015-03-20 LAB — HEMOGLOBIN A1C
ESTIMATED AVERAGE GLUCOSE: 126 mg/dL
Hgb A1c MFr Bld: 6 % — ABNORMAL HIGH (ref 4.8–5.6)

## 2015-03-20 LAB — HEPATIC FUNCTION PANEL
ALK PHOS: 91 IU/L (ref 39–117)
ALT: 14 IU/L (ref 0–32)
AST: 24 IU/L (ref 0–40)
Albumin: 4.5 g/dL (ref 3.5–4.8)
BILIRUBIN TOTAL: 0.5 mg/dL (ref 0.0–1.2)
BILIRUBIN, DIRECT: 0.15 mg/dL (ref 0.00–0.40)
Total Protein: 7.5 g/dL (ref 6.0–8.5)

## 2015-03-20 LAB — LIPID PANEL
CHOL/HDL RATIO: 2.9 ratio (ref 0.0–4.4)
CHOLESTEROL TOTAL: 199 mg/dL (ref 100–199)
HDL: 69 mg/dL (ref >39)
LDL Calculated: 108 mg/dL — ABNORMAL HIGH (ref 0–99)
TRIGLYCERIDES: 108 mg/dL (ref 0–149)
VLDL Cholesterol Cal: 22 mg/dL (ref 5–40)

## 2015-03-20 LAB — BASIC METABOLIC PANEL
BUN/Creatinine Ratio: 17 (ref 11–26)
BUN: 12 mg/dL (ref 8–27)
CALCIUM: 9.4 mg/dL (ref 8.7–10.3)
CHLORIDE: 101 mmol/L (ref 96–106)
CO2: 23 mmol/L (ref 18–29)
Creatinine, Ser: 0.69 mg/dL (ref 0.57–1.00)
GFR calc Af Amer: 98 mL/min/{1.73_m2} (ref >59)
GFR, EST NON AFRICAN AMERICAN: 85 mL/min/{1.73_m2} (ref >59)
Glucose: 99 mg/dL (ref 65–99)
POTASSIUM: 4.2 mmol/L (ref 3.5–5.2)
Sodium: 142 mmol/L (ref 134–144)

## 2015-03-20 LAB — TSH: TSH: 0.935 u[IU]/mL (ref 0.450–4.500)

## 2015-03-31 ENCOUNTER — Encounter: Payer: Self-pay | Admitting: Family Medicine

## 2015-03-31 ENCOUNTER — Ambulatory Visit (INDEPENDENT_AMBULATORY_CARE_PROVIDER_SITE_OTHER): Payer: PPO | Admitting: Family Medicine

## 2015-03-31 ENCOUNTER — Telehealth: Payer: Self-pay | Admitting: Family Medicine

## 2015-03-31 VITALS — BP 108/66 | Ht 62.0 in | Wt 140.4 lb

## 2015-03-31 DIAGNOSIS — R7303 Prediabetes: Secondary | ICD-10-CM | POA: Diagnosis not present

## 2015-03-31 DIAGNOSIS — K219 Gastro-esophageal reflux disease without esophagitis: Secondary | ICD-10-CM

## 2015-03-31 DIAGNOSIS — E785 Hyperlipidemia, unspecified: Secondary | ICD-10-CM | POA: Diagnosis not present

## 2015-03-31 DIAGNOSIS — R194 Change in bowel habit: Secondary | ICD-10-CM

## 2015-03-31 DIAGNOSIS — E038 Other specified hypothyroidism: Secondary | ICD-10-CM | POA: Diagnosis not present

## 2015-03-31 MED ORDER — RANITIDINE HCL 150 MG PO TABS: 150.0000 mg | ORAL_TABLET | Freq: Two times a day (BID) | ORAL | Status: DC

## 2015-03-31 NOTE — Telephone Encounter (Signed)
Pt aware script sent in to Pharmacy

## 2015-03-31 NOTE — Telephone Encounter (Signed)
Pt states she was supposed to get a script for zantac when here today  Can we send that to the [facility]?

## 2015-03-31 NOTE — Telephone Encounter (Signed)
Please let the patient know that that prescription was printed but I did not give it to her at the time she was here but we can go ahead and fax it to [facility]-thanks

## 2015-03-31 NOTE — Progress Notes (Signed)
   Subjective:    Patient ID: Jordan Grant, female    DOB: Jul 08, 1940, 75 y.o.   MRN: [id number]  Hyperlipidemia This is a chronic problem. The current episode started more than 1 year ago. There are no compliance problems.    patient relates reflux issues although she states it not severe she would like to try some different then PPI because she's heard of some any long-term side effects She does have thyroid issues recent lab work looked good takes her medicine on a regular basis A1c slightly elevated she does try to watch his sugars under diet and starches in her diet she does try to stay physically active exercise She denies any rectal bleeding but she did have some intermittent lower abdominal symptoms she states she is interested in getting colonoscopy if it is covered by her insurance Patient takes her cholesterol medicine 5 mg every other day and tolerates it well she did not tolerate higher dose  Patient states no other concerns this visit.  Review of Systems She denies any chest tightness pressure pain shortness breath nausea vomiting muscle aches joint pains headaches    Objective:   Physical Exam Neck no masses lungs clear no crackles heart regular pulse normal BP good on recheck extremities no edema skin warm dry       Assessment & Plan:  Hypothyroidism doing well on medication continue this lab work looks good  Prediabetes watch starches in diet exercise keep weight down  Reflux condition try Zantac 150 mg 1 twice a day if this does well enough then stop Prilosec.  Hyperlipidemia using Crestor one every other day this seems to be doing decent job continue current measures  We will look into see if colonoscopy is covered under her current insurance plan  81 mg aspirin would be reasonable although there is a risk of GI bleeding. Because of this risk as well as her overall reassuring numbers I would recommend she not take 81 mg.  [id number]

## 2015-04-20 ENCOUNTER — Encounter: Payer: Self-pay | Admitting: Internal Medicine

## 2015-04-25 ENCOUNTER — Telehealth: Payer: Self-pay | Admitting: Family Medicine

## 2015-04-25 NOTE — Telephone Encounter (Signed)
Please let the patient know that we looked into the colonoscopy issue. She has had some problems with bowel habit changes and we were trying to see if Medicare would cover colonoscopy. Please let the patient know that Medicare guidelines state that it would be fine for her to go ahead and do another colonoscopy. Her last one was in 2007 in February. We can go ahead and refer her to Dr. Fuller Plan who is heard gastroenterologist for a follow-up colonoscopy. That is, if the patient is interested in getting this completed. Please initiate referral if the patient is agreeing. Reason for referral screening colonoscopy/bowel habit changes. I believe it would be a reasonable idea for her to do. If this colonoscopy looks good then by the guidelines she will not need one in her 95s.

## 2015-04-26 NOTE — Telephone Encounter (Signed)
Patient stated she receive a card last week from her gi doctor([facility]) stating to call their office to set up a screening colonoscopy. Patient stated she will call their office and set up her appt and have them verify it is covered by her insurance.

## 2015-04-27 DIAGNOSIS — L570 Actinic keratosis: Secondary | ICD-10-CM | POA: Diagnosis not present

## 2015-04-27 DIAGNOSIS — X32XXXA Exposure to sunlight, initial encounter: Secondary | ICD-10-CM | POA: Diagnosis not present

## 2015-04-27 DIAGNOSIS — D225 Melanocytic nevi of trunk: Secondary | ICD-10-CM | POA: Diagnosis not present

## 2015-04-29 ENCOUNTER — Ambulatory Visit (AMBULATORY_SURGERY_CENTER): Payer: Self-pay | Admitting: *Deleted

## 2015-04-29 VITALS — Ht 62.0 in | Wt 137.0 lb

## 2015-04-29 DIAGNOSIS — Z1211 Encounter for screening for malignant neoplasm of colon: Secondary | ICD-10-CM

## 2015-04-29 NOTE — Progress Notes (Signed)
No egg or soy allergy. No anesthesia problems.  No home O2.  No diet meds.  No emmi given.  

## 2015-04-30 ENCOUNTER — Encounter: Payer: Self-pay | Admitting: Internal Medicine

## 2015-05-11 ENCOUNTER — Encounter: Payer: Self-pay | Admitting: Internal Medicine

## 2015-05-11 ENCOUNTER — Ambulatory Visit (AMBULATORY_SURGERY_CENTER): Payer: PPO | Admitting: Internal Medicine

## 2015-05-11 VITALS — BP 131/73 | HR 66 | Temp 98.0°F | Resp 12 | Ht 62.0 in | Wt 137.0 lb

## 2015-05-11 DIAGNOSIS — D123 Benign neoplasm of transverse colon: Secondary | ICD-10-CM

## 2015-05-11 DIAGNOSIS — E079 Disorder of thyroid, unspecified: Secondary | ICD-10-CM | POA: Diagnosis not present

## 2015-05-11 DIAGNOSIS — Z1211 Encounter for screening for malignant neoplasm of colon: Secondary | ICD-10-CM | POA: Diagnosis not present

## 2015-05-11 MED ORDER — SODIUM CHLORIDE 0.9 % IV SOLN: 500.0000 mL | INTRAVENOUS | Status: DC

## 2015-05-11 NOTE — Progress Notes (Signed)
Called to room to assist during endoscopic procedure.  Patient ID and intended procedure confirmed with present staff. Received instructions for my participation in the procedure from the performing physician.  

## 2015-05-11 NOTE — Progress Notes (Signed)
Patient awakening,vss,report to rn 

## 2015-05-11 NOTE — Patient Instructions (Signed)
    HANDOUTS GIVEN : POLYPS AND DIVERTICULOSIS.   YOU HAD AN ENDOSCOPIC PROCEDURE TODAY AT [place]:   Refer to the procedure report that was given to you for any specific questions about what was found during the examination.  If the procedure report does not answer your questions, please call your gastroenterologist to clarify.  If you requested that your care partner not be given the details of your procedure findings, then the procedure report has been included in a sealed envelope for you to review at your convenience later.  YOU SHOULD EXPECT: Some feelings of bloating in the abdomen. Passage of more gas than usual.  Walking can help get rid of the air that was put into your GI tract during the procedure and reduce the bloating. If you had a lower endoscopy (such as a colonoscopy or flexible sigmoidoscopy) you may notice spotting of blood in your stool or on the toilet paper. If you underwent a bowel prep for your procedure, you may not have a normal bowel movement for a few days.  Please Note:  You might notice some irritation and congestion in your nose or some drainage.  This is from the oxygen used during your procedure.  There is no need for concern and it should clear up in a day or so.  SYMPTOMS TO REPORT IMMEDIATELY:   Following lower endoscopy (colonoscopy or flexible sigmoidoscopy):  Excessive amounts of blood in the stool  Significant tenderness or worsening of abdominal pains  Swelling of the abdomen that is new, acute  Fever of 100F or higher  For urgent or emergent issues, a gastroenterologist can be reached at any hour by calling [phone].   DIET: Your first meal following the procedure should be a small meal and then it is ok to progress to your normal diet. Heavy or fried foods are harder to digest and may make you feel nauseous or bloated.  Likewise, meals heavy in dairy and vegetables can increase bloating.  Drink plenty of fluids but you  should avoid alcoholic beverages for 24 hours.  ACTIVITY:  You should plan to take it easy for the rest of today and you should NOT DRIVE or use heavy machinery until tomorrow (because of the sedation medicines used during the test).    FOLLOW UP: Our staff will call the number listed on your records the next business day following your procedure to check on you and address any questions or concerns that you may have regarding the information given to you following your procedure. If we do not reach you, we will leave a message.  However, if you are feeling well and you are not experiencing any problems, there is no need to return our call.  We will assume that you have returned to your regular daily activities without incident.  If any biopsies were taken you will be contacted by phone or by letter within the next 1-3 weeks.  Please call us at [phone] if you have not heard about the biopsies in 3 weeks.    SIGNATURES/CONFIDENTIALITY: You and/or your care partner have signed paperwork which will be entered into your electronic medical record.  These signatures attest to the fact that that the information above on your After Visit Summary has been reviewed and is understood.  Full responsibility of the confidentiality of this discharge information lies with you and/or your care-partner.

## 2015-05-11 NOTE — Op Note (Signed)
[place] Patient Name: Jordan Grant Procedure Date: 05/11/2015 11:14 AM MRN: [id number] Endoscopist: Docia Chuck. Henrene Pastor , MD Age: 75 Referring MD:  Date of Birth: April 11, 1940 Gender: Female Procedure:                Colonoscopy, with cold snare polypectomy -1 Indications:              Screening for colorectal malignant neoplasm.                            Previous examination 2002 with hyperplastic polyp.                            Last examination February 2007 without neoplasia Medicines:                Monitored Anesthesia Care Procedure:                Pre-Anesthesia Assessment:                           - Prior to the procedure, a History and Physical                            was performed, and patient medications and                            allergies were reviewed. The patient's tolerance of                            previous anesthesia was also reviewed. The risks                            and benefits of the procedure and the sedation                            options and risks were discussed with the patient.                            All questions were answered, and informed consent                            was obtained. Prior Anticoagulants: The patient has                            taken no previous anticoagulant or antiplatelet                            agents. ASA Grade Assessment: II - A patient with                            mild systemic disease. After reviewing the risks                            and benefits, the patient was deemed in  satisfactory condition to undergo the procedure.                           After obtaining informed consent, the colonoscope                            was passed under direct vision. Throughout the                            procedure, the patient's blood pressure, pulse, and                            oxygen saturations were monitored continuously. The   Model CF-HQ190L [id number]) scope was introduced                            through the anus and advanced to the the cecum,                            identified by appendiceal orifice and ileocecal                            valve. The colonoscopy was performed without                            difficulty. The patient tolerated the procedure                            well. The quality of the bowel preparation was                            good. The bowel preparation used was Miralax. The                            ileocecal valve, appendiceal orifice, and rectum                            were photographed. Scope In: 11:29:50 AM Scope Out: 11:49:42 AM Scope Withdrawal Time: 0 hours 15 minutes 20 seconds  Total Procedure Duration: 0 hours 19 minutes 52 seconds  Findings:      The digital rectal exam was normal.      A 5 mm polyp was found in the mid transverse colon. The polyp was       removed with a cold snare. Resection and retrieval were complete.      Multiple medium-mouthed diverticula were found in the sigmoid colon.      The exam was otherwise without abnormality on direct and retroflexion       views. Complications:            No immediate complications. Estimated Blood Loss:     Estimated blood loss: none. Impression:               - One 5 mm polyp in the mid transverse colon,  removed with a cold snare. Resected and retrieved.                           - Diverticulosis in the sigmoid colon.                           - The examination was otherwise normal on direct                            and retroflexion views. Recommendation:           - Patient has a contact number available for                            emergencies. The signs and symptoms of potential                            delayed complications were discussed with the                            patient. Return to normal activities tomorrow.                            Written  discharge instructions were provided to the                            patient.                           - Resume previous diet.                           - Continue present medications.                           - Await pathology results.                           - No repeat colonoscopy due to age. Procedure Code(s):        --- Professional ---                           [phone], Colonoscopy, flexible; with removal of                            tumor(s), polyp(s), or other lesion(s) by snare                            technique CPT copyright 2016 [employer]. All rights reserved. Docia Chuck. Henrene Pastor, MD 05/11/2015 12:00:56 PM This report has been signed electronically. Number of Addenda: 0 Referring MD:      Nicki Reaper A. Luking

## 2015-05-12 ENCOUNTER — Telehealth: Payer: Self-pay

## 2015-05-12 NOTE — Telephone Encounter (Signed)
  Follow up Call-  Call back number 05/11/2015  Post procedure Call Back phone  # [phone] hm  Permission to leave phone message Yes     Patient questions:  Do you have a fever, pain , or abdominal swelling? No. Pain Score  0 *  Have you tolerated food without any problems? Yes.    Have you been able to return to your normal activities? Yes.    Do you have any questions about your discharge instructions: Diet   No. Medications  No. Follow up visit  No.  Do you have questions or concerns about your Care? No.  Actions: * If pain score is 4 or above: No action needed, pain <4.  Per the pt she said, "I had a rough night, I had diarrhea after I got home and was very gassy".  Pt reported she called back on the emergency number and was told, "that was normal".  I asked how she was this am and pt reported, "I am fine".  I said to call us back if any other sx or problems today.  Pt said she would.  maw

## 2015-05-17 ENCOUNTER — Encounter: Payer: Self-pay | Admitting: Internal Medicine

## 2015-05-20 ENCOUNTER — Other Ambulatory Visit: Payer: Self-pay

## 2015-05-20 MED ORDER — OMEPRAZOLE 40 MG PO CPDR: 40.0000 mg | DELAYED_RELEASE_CAPSULE | Freq: Every day | ORAL | Status: DC

## 2015-07-06 ENCOUNTER — Telehealth: Payer: Self-pay | Admitting: Family Medicine

## 2015-07-06 MED ORDER — LEVOTHYROXINE SODIUM 75 MCG PO TABS: 75.0000 ug | ORAL_TABLET | Freq: Every day | ORAL | Status: DC

## 2015-07-06 NOTE — Telephone Encounter (Signed)
levothyroxine (SYNTHROID, LEVOTHROID) 75 MCG tablet   Needs refill, [facility] has told her we denied this refill  And that she needed an appt. Can we send this in for her so she  Don't run out

## 2015-07-06 NOTE — Telephone Encounter (Signed)
6 months worth of refills sent to pharmacy. Notified patient we never received a refill request for this medication from her pharmacy. So we can't deny a request we never received. Notified patient she is current on her office visits and does not need an appointment right now. Last on was February 2017. Apologized to patient for being told this incorrect information by her pharmacy.

## 2015-08-09 ENCOUNTER — Other Ambulatory Visit: Payer: Self-pay | Admitting: Family Medicine

## 2015-08-09 NOTE — Telephone Encounter (Signed)
May have this and 3 refills 

## 2015-09-09 ENCOUNTER — Ambulatory Visit (INDEPENDENT_AMBULATORY_CARE_PROVIDER_SITE_OTHER): Payer: PPO | Admitting: Nurse Practitioner

## 2015-09-09 ENCOUNTER — Encounter: Payer: Self-pay | Admitting: Nurse Practitioner

## 2015-09-09 VITALS — BP 108/68 | Temp 98.6°F | Ht 62.0 in | Wt 139.1 lb

## 2015-09-09 DIAGNOSIS — N390 Urinary tract infection, site not specified: Secondary | ICD-10-CM

## 2015-09-09 LAB — POCT URINALYSIS DIPSTICK
Spec Grav, UA: 1.015
pH, UA: 5

## 2015-09-09 MED ORDER — CEFDINIR 300 MG PO CAPS: 300.0000 mg | ORAL_CAPSULE | Freq: Two times a day (BID) | ORAL | 0 refills | Status: DC

## 2015-09-09 NOTE — Patient Instructions (Addendum)
AZO as directed for 48 hours then stop 

## 2015-09-11 ENCOUNTER — Encounter: Payer: Self-pay | Admitting: Nurse Practitioner

## 2015-09-11 LAB — POCT UA - MICROSCOPIC ONLY
BACTERIA, U MICROSCOPIC: NEGATIVE
RBC, urine, microscopic: NEGATIVE

## 2015-09-11 NOTE — Progress Notes (Signed)
Subjective:  Presents for c/o dysuria, urgency and frequency that began 4 days ago. No fever. Mid pelvic area pain. No N/V. Mild mid to low back pain more on the left side with anterior flank pain at time. Married, but not sexually active. No discharge. No previous history of UTI.   Objective:   BP 108/68   Temp 98.6 F (37 C) (Oral)   Ht 5\' 2"  (1.575 m)   Wt 139 lb 2 oz (63.1 kg)   BMI 25.45 kg/m  NAD. Alert, oriented. Lungs clear. Heart RRR. Mild left CVA and anterior flank tenderness. Mild suprapubic area tenderness.  Results for orders placed or performed in visit on 09/09/15  POCT urinalysis dipstick  Result Value Ref Range   Color, UA Yellow    Clarity, UA Clear    Glucose, UA     Bilirubin, UA     Ketones, UA     Spec Grav, UA 1.015    Blood, UA Small    pH, UA 5.0    Protein, UA     Urobilinogen, UA     Nitrite, UA     Leukocytes, UA moderate (2+) (A) Negative  POCT UA - Microscopic Only  Result Value Ref Range   WBC, Ur, HPF, POC rare    RBC, urine, microscopic neg    Bacteria, U Microscopic neg    Mucus, UA     Epithelial cells, urine per micros rare    Crystals, Ur, HPF, POC     Casts, Ur, LPF, POC     Yeast, UA       Assessment: UTI (lower urinary tract infection) possible early pyelonephritis - Plan: POCT urinalysis dipstick, CANCELED: POCT HgB A1C  Plan:  Meds ordered this encounter  Medications  . cefdinir (OMNICEF) 300 MG capsule    Sig: Take 1 capsule (300 mg total) by mouth 2 (two) times daily.    Dispense:  14 capsule    Refill:  0    Order Specific Question:   Supervising Provider    Answer:   Mikey Kirschner [[id number]]   AZO as directed for 48 hours then stop. Warning signs reviewed. Call back in 4 days if no better, sooner if worse.

## 2015-09-28 ENCOUNTER — Ambulatory Visit (INDEPENDENT_AMBULATORY_CARE_PROVIDER_SITE_OTHER): Payer: PPO | Admitting: Family Medicine

## 2015-09-28 ENCOUNTER — Encounter: Payer: Self-pay | Admitting: Family Medicine

## 2015-09-28 VITALS — BP 118/74 | Ht 61.5 in | Wt 134.0 lb

## 2015-09-28 DIAGNOSIS — Z0000 Encounter for general adult medical examination without abnormal findings: Secondary | ICD-10-CM | POA: Diagnosis not present

## 2015-09-28 DIAGNOSIS — E038 Other specified hypothyroidism: Secondary | ICD-10-CM | POA: Diagnosis not present

## 2015-09-28 DIAGNOSIS — R7303 Prediabetes: Secondary | ICD-10-CM

## 2015-09-28 DIAGNOSIS — Z79899 Other long term (current) drug therapy: Secondary | ICD-10-CM | POA: Diagnosis not present

## 2015-09-28 DIAGNOSIS — E785 Hyperlipidemia, unspecified: Secondary | ICD-10-CM

## 2015-09-28 MED ORDER — TRAMADOL HCL 50 MG PO TABS: ORAL_TABLET | ORAL | 3 refills | Status: DC

## 2015-09-28 NOTE — Progress Notes (Signed)
   Subjective:    Patient ID: Jordan Grant, female    DOB: [birth year], 75 y.o.   MRN: [id number]  HPI AWV- Annual Wellness Visit  The patient was seen for their annual wellness visit. The patient's past medical history, surgical history, and family history were reviewed. Pertinent vaccines were reviewed ( tetanus, pneumonia, shingles, flu) The patient's medication list was reviewed and updated.  The height and weight were entered. The patient's current BMI is: 24.91  Cognitive screening was completed. Outcome of Mini - Cog: pass  Falls within the past 6 months: none  Current tobacco usage: none (All patients who use tobacco were given written and verbal information on quitting)  Recent listing of emergency department/hospitalizations over the past year were reviewed.  current specialist the patient sees on a regular basis: none   Medicare annual wellness visit patient questionnaire was reviewed.  A written screening schedule for the patient for the next 5-10 years was given. Appropriate discussion of followup regarding next visit was discussed.   Cognitive skills patient passes. No falls no depression.    Review of Systems  Constitutional: Negative for activity change, appetite change and fatigue.  HENT: Negative for congestion, ear discharge and rhinorrhea.   Eyes: Negative for discharge.  Respiratory: Negative for cough, chest tightness and wheezing.   Cardiovascular: Negative for chest pain.  Gastrointestinal: Negative for abdominal pain and vomiting.  Genitourinary: Negative for difficulty urinating and frequency.  Musculoskeletal: Negative for neck pain.  Allergic/Immunologic: Negative for environmental allergies and food allergies.  Neurological: Negative for weakness and headaches.  Psychiatric/Behavioral: Negative for agitation and behavioral problems.       Objective:   Physical Exam  Constitutional: She is oriented to person, place, and time. She  appears well-developed and well-nourished.  HENT:  Head: Normocephalic.  Right Ear: External ear normal.  Left Ear: External ear normal.  Eyes: Pupils are equal, round, and reactive to light.  Neck: Normal range of motion. No thyromegaly present.  Cardiovascular: Normal rate, regular rhythm, normal heart sounds and intact distal pulses.   No murmur heard. Pulmonary/Chest: Effort normal and breath sounds normal. No respiratory distress. She has no wheezes.  Abdominal: Soft. Bowel sounds are normal. She exhibits no distension and no mass. There is no tenderness.  Musculoskeletal: Normal range of motion. She exhibits no edema or tenderness.  Lymphadenopathy:    She has no cervical adenopathy.  Neurological: She is alert and oriented to person, place, and time. She exhibits normal muscle tone.  Skin: Skin is warm and dry.  Psychiatric: She has a normal mood and affect. Her behavior is normal.   Pelvic exam normal Breast exam normal.       Assessment & Plan:  Wellness-safety dietary all discussed. The importance of diet regular physical activity discussed. Patient up-to-date on colonoscopy she knows because of her age she will not need another one.  Patient does not need Pap smear pelvic exam was normal  Patient aware to get mammogram later this year  We will do bone density in December of this year  Patient follow-up in 6 months time  Prediabetes check A1c watch diet closely stay physically active  Hyperlipidemia previous labs reviewed with the patient patient taking Crestor every other day continue continue this  Intermittent body aches may use tramadol on a when necessary basis but don't use daily just for the sake of using it  Hypothyroidism takes her medicine previous labs reviewed labs ordered.

## 2015-10-01 DIAGNOSIS — Z0000 Encounter for general adult medical examination without abnormal findings: Secondary | ICD-10-CM | POA: Diagnosis not present

## 2015-10-01 DIAGNOSIS — E038 Other specified hypothyroidism: Secondary | ICD-10-CM | POA: Diagnosis not present

## 2015-10-01 DIAGNOSIS — Z79899 Other long term (current) drug therapy: Secondary | ICD-10-CM | POA: Diagnosis not present

## 2015-10-01 DIAGNOSIS — E785 Hyperlipidemia, unspecified: Secondary | ICD-10-CM | POA: Diagnosis not present

## 2015-10-01 DIAGNOSIS — R7303 Prediabetes: Secondary | ICD-10-CM | POA: Diagnosis not present

## 2015-10-02 LAB — HEMOGLOBIN A1C
ESTIMATED AVERAGE GLUCOSE: 128 mg/dL
Hgb A1c MFr Bld: 6.1 % — ABNORMAL HIGH (ref 4.8–5.6)

## 2015-10-02 LAB — BASIC METABOLIC PANEL
BUN/Creatinine Ratio: 18 (ref 12–28)
BUN: 11 mg/dL (ref 8–27)
CALCIUM: 9.5 mg/dL (ref 8.7–10.3)
CO2: 25 mmol/L (ref 18–29)
CREATININE: 0.61 mg/dL (ref 0.57–1.00)
Chloride: 101 mmol/L (ref 96–106)
GFR, EST AFRICAN AMERICAN: 102 mL/min/{1.73_m2} (ref >59)
GFR, EST NON AFRICAN AMERICAN: 88 mL/min/{1.73_m2} (ref >59)
Glucose: 100 mg/dL — ABNORMAL HIGH (ref 65–99)
Potassium: 4.4 mmol/L (ref 3.5–5.2)
Sodium: 142 mmol/L (ref 134–144)

## 2015-10-02 LAB — HEPATIC FUNCTION PANEL
ALBUMIN: 4.3 g/dL (ref 3.5–4.8)
ALK PHOS: 78 IU/L (ref 39–117)
ALT: 12 IU/L (ref 0–32)
AST: 22 IU/L (ref 0–40)
BILIRUBIN TOTAL: 0.5 mg/dL (ref 0.0–1.2)
Bilirubin, Direct: 0.13 mg/dL (ref 0.00–0.40)
TOTAL PROTEIN: 7.1 g/dL (ref 6.0–8.5)

## 2015-10-02 LAB — LIPID PANEL
Chol/HDL Ratio: 2.4 ratio units (ref 0.0–4.4)
Cholesterol, Total: 159 mg/dL (ref 100–199)
HDL: 67 mg/dL (ref >39)
LDL CALC: 73 mg/dL (ref 0–99)
Triglycerides: 94 mg/dL (ref 0–149)
VLDL CHOLESTEROL CAL: 19 mg/dL (ref 5–40)

## 2015-10-02 LAB — TSH: TSH: 1.05 u[IU]/mL (ref 0.450–4.500)

## 2015-10-28 ENCOUNTER — Other Ambulatory Visit: Payer: Self-pay | Admitting: *Deleted

## 2015-10-28 MED ORDER — OMEPRAZOLE 40 MG PO CPDR: 40.0000 mg | DELAYED_RELEASE_CAPSULE | Freq: Every day | ORAL | 1 refills | Status: DC

## 2015-11-29 ENCOUNTER — Other Ambulatory Visit: Payer: Self-pay | Admitting: Family Medicine

## 2015-11-29 DIAGNOSIS — Z1231 Encounter for screening mammogram for malignant neoplasm of breast: Secondary | ICD-10-CM

## 2015-12-15 ENCOUNTER — Other Ambulatory Visit: Payer: Self-pay | Admitting: *Deleted

## 2015-12-15 MED ORDER — LEVOTHYROXINE SODIUM 75 MCG PO TABS: 75.0000 ug | ORAL_TABLET | Freq: Every day | ORAL | 1 refills | Status: DC

## 2015-12-16 ENCOUNTER — Ambulatory Visit (HOSPITAL_COMMUNITY)
Admission: RE | Admit: 2015-12-16 | Discharge: 2015-12-16 | Disposition: A | Discharge status: Home / Self Care | Payer: PPO | Source: Ambulatory Visit | Attending: Family Medicine | Admitting: Family Medicine

## 2015-12-16 DIAGNOSIS — Z1231 Encounter for screening mammogram for malignant neoplasm of breast: Secondary | ICD-10-CM | POA: Diagnosis not present

## 2015-12-30 ENCOUNTER — Ambulatory Visit (INDEPENDENT_AMBULATORY_CARE_PROVIDER_SITE_OTHER): Payer: PPO

## 2015-12-30 DIAGNOSIS — Z23 Encounter for immunization: Secondary | ICD-10-CM

## 2016-01-19 ENCOUNTER — Other Ambulatory Visit: Payer: Self-pay | Admitting: *Deleted

## 2016-01-19 ENCOUNTER — Telehealth: Payer: Self-pay | Admitting: *Deleted

## 2016-01-19 DIAGNOSIS — Z780 Asymptomatic menopausal state: Secondary | ICD-10-CM

## 2016-01-19 DIAGNOSIS — M8580 Other specified disorders of bone density and structure, unspecified site: Secondary | ICD-10-CM

## 2016-01-19 NOTE — Telephone Encounter (Signed)
Bone density test scheduled aph dec 18th register 12:45. Pt notified.

## 2016-01-31 ENCOUNTER — Ambulatory Visit (HOSPITAL_COMMUNITY)
Admission: RE | Admit: 2016-01-31 | Discharge: 2016-01-31 | Disposition: A | Discharge status: Home / Self Care | Payer: PPO | Source: Ambulatory Visit | Attending: Family Medicine | Admitting: Family Medicine

## 2016-01-31 DIAGNOSIS — M8588 Other specified disorders of bone density and structure, other site: Secondary | ICD-10-CM | POA: Insufficient documentation

## 2016-01-31 DIAGNOSIS — Z780 Asymptomatic menopausal state: Secondary | ICD-10-CM | POA: Insufficient documentation

## 2016-02-01 ENCOUNTER — Encounter: Payer: Self-pay | Admitting: Family Medicine

## 2016-02-08 ENCOUNTER — Other Ambulatory Visit: Payer: Self-pay | Admitting: Family Medicine

## 2016-02-09 NOTE — Telephone Encounter (Signed)
She may have this +2 refills 

## 2016-05-11 ENCOUNTER — Other Ambulatory Visit: Payer: Self-pay | Admitting: Family Medicine

## 2016-05-16 ENCOUNTER — Other Ambulatory Visit: Payer: Self-pay

## 2016-05-16 MED ORDER — OMEPRAZOLE 40 MG PO CPDR: 40.0000 mg | DELAYED_RELEASE_CAPSULE | Freq: Every day | ORAL | 1 refills | Status: DC

## 2016-05-16 MED ORDER — TRAMADOL HCL 50 MG PO TABS
ORAL_TABLET | ORAL | 5 refills | Status: DC
Start: 2016-05-16 — End: 2016-10-04

## 2016-05-16 NOTE — Progress Notes (Signed)
May do 6 months refill on both medications

## 2016-06-05 ENCOUNTER — Telehealth: Payer: Self-pay | Admitting: Family Medicine

## 2016-06-05 DIAGNOSIS — Z79899 Other long term (current) drug therapy: Secondary | ICD-10-CM

## 2016-06-05 DIAGNOSIS — E785 Hyperlipidemia, unspecified: Secondary | ICD-10-CM

## 2016-06-05 DIAGNOSIS — E038 Other specified hypothyroidism: Secondary | ICD-10-CM

## 2016-06-05 DIAGNOSIS — R7303 Prediabetes: Secondary | ICD-10-CM

## 2016-06-05 NOTE — Telephone Encounter (Signed)
Requesting order for blood work for appointment on 06/13/16 with Dr. Nicki Reaper.

## 2016-06-05 NOTE — Telephone Encounter (Signed)
Lipid, liver, metabolic 7, TSH, hemoglobin A1c

## 2016-06-05 NOTE — Telephone Encounter (Signed)
Blood work ordered in EPIC. Patient notified. 

## 2016-06-05 NOTE — Telephone Encounter (Signed)
Patients recent lab work Lipid, liver met 7, HgbA1c and tsh  09/2015

## 2016-06-09 DIAGNOSIS — R7303 Prediabetes: Secondary | ICD-10-CM | POA: Diagnosis not present

## 2016-06-09 DIAGNOSIS — Z79899 Other long term (current) drug therapy: Secondary | ICD-10-CM | POA: Diagnosis not present

## 2016-06-09 DIAGNOSIS — E785 Hyperlipidemia, unspecified: Secondary | ICD-10-CM | POA: Diagnosis not present

## 2016-06-09 DIAGNOSIS — E038 Other specified hypothyroidism: Secondary | ICD-10-CM | POA: Diagnosis not present

## 2016-06-10 LAB — BASIC METABOLIC PANEL
BUN / CREAT RATIO: 16 (ref 12–28)
BUN: 11 mg/dL (ref 8–27)
CO2: 25 mmol/L (ref 18–29)
Calcium: 9.3 mg/dL (ref 8.7–10.3)
Chloride: 100 mmol/L (ref 96–106)
Creatinine, Ser: 0.7 mg/dL (ref 0.57–1.00)
GFR calc Af Amer: 97 mL/min/{1.73_m2} (ref >59)
GFR, EST NON AFRICAN AMERICAN: 84 mL/min/{1.73_m2} (ref >59)
GLUCOSE: 101 mg/dL — AB (ref 65–99)
Potassium: 4.6 mmol/L (ref 3.5–5.2)
Sodium: 141 mmol/L (ref 134–144)

## 2016-06-10 LAB — HEMOGLOBIN A1C
Est. average glucose Bld gHb Est-mCnc: 120 mg/dL
HEMOGLOBIN A1C: 5.8 % — AB (ref 4.8–5.6)

## 2016-06-10 LAB — LIPID PANEL
Chol/HDL Ratio: 3.7 ratio (ref 0.0–4.4)
Cholesterol, Total: 185 mg/dL (ref 100–199)
HDL: 50 mg/dL (ref >39)
LDL Calculated: 102 mg/dL — ABNORMAL HIGH (ref 0–99)
Triglycerides: 167 mg/dL — ABNORMAL HIGH (ref 0–149)
VLDL CHOLESTEROL CAL: 33 mg/dL (ref 5–40)

## 2016-06-10 LAB — HEPATIC FUNCTION PANEL
ALBUMIN: 4.2 g/dL (ref 3.5–4.8)
ALK PHOS: 87 IU/L (ref 39–117)
ALT: 15 IU/L (ref 0–32)
AST: 20 IU/L (ref 0–40)
Bilirubin Total: 0.4 mg/dL (ref 0.0–1.2)
Bilirubin, Direct: 0.12 mg/dL (ref 0.00–0.40)
Total Protein: 6.9 g/dL (ref 6.0–8.5)

## 2016-06-10 LAB — TSH: TSH: 1.72 u[IU]/mL (ref 0.450–4.500)

## 2016-06-13 ENCOUNTER — Ambulatory Visit (INDEPENDENT_AMBULATORY_CARE_PROVIDER_SITE_OTHER): Payer: PPO | Admitting: Family Medicine

## 2016-06-13 VITALS — BP 112/72 | Ht 61.0 in | Wt 137.0 lb

## 2016-06-13 DIAGNOSIS — R1030 Lower abdominal pain, unspecified: Secondary | ICD-10-CM

## 2016-06-13 DIAGNOSIS — E038 Other specified hypothyroidism: Secondary | ICD-10-CM | POA: Diagnosis not present

## 2016-06-13 DIAGNOSIS — K219 Gastro-esophageal reflux disease without esophagitis: Secondary | ICD-10-CM | POA: Diagnosis not present

## 2016-06-13 DIAGNOSIS — E785 Hyperlipidemia, unspecified: Secondary | ICD-10-CM

## 2016-06-13 DIAGNOSIS — R7303 Prediabetes: Secondary | ICD-10-CM

## 2016-06-13 NOTE — Progress Notes (Signed)
   Subjective:    Patient ID: Jordan Grant, female    DOB: 11-Sep-1940, 76 y.o.   MRN: [id number]  Hyperlipidemia  This is a chronic problem. The current episode started more than 1 year ago. Pertinent negatives include no chest pain or shortness of breath. There are no compliance problems (eats healthy, exercises, takes meds every day).   This patient is been having lower abdominal pain and discomfort over the past 6 months his been progressively worse it lasts anywhere from 30 minutes to several hours at times it will be in the mid abdomen most of the time is in the lower abdomen. She feels bloated at times. She had a CT scan for flank pain back in 2015 with an ultrasound as well with this is a increased pain you are onset over the past 6 months it does wake her up at night she denies any rectal bleeding. She is not had this problem before. She states her bowel movements are predictable not bloody not mucousy. No vomiting. Does not get better with rest does not get better with any particular medications she is tried only Tylenol she does not have a history of irritable bowel syndrome Pt states no problems or concerns.  She does have a history of prediabetes she watches her diet closely She has a history of cholesterol takes her medicine previous labs reviewed She does have a history of reflux takes medication it does good job controlling it She takes her thyroid medicine on a regular basis All of her lab work was reviewed with her in detail   Review of Systems  Constitutional: Negative for activity change, fatigue and fever.  Respiratory: Negative for cough and shortness of breath.   Cardiovascular: Negative for chest pain and leg swelling.  Neurological: Negative for headaches.       Objective:   Physical Exam  Constitutional: She appears well-nourished. No distress.  Cardiovascular: Normal rate, regular rhythm and normal heart sounds.   No murmur heard. Pulmonary/Chest: Effort  normal and breath sounds normal. No respiratory distress.  Musculoskeletal: She exhibits no edema.  Lymphadenopathy:    She has no cervical adenopathy.  Neurological: She is alert. She exhibits normal muscle tone.  Psychiatric: Her behavior is normal.  Vitals reviewed.  Subjective lower abdominal tenderness no guarding or rebound       Assessment & Plan:  Hypothyroidism good bladder control continue current measures  Prediabetes doing well overall watch diet closely stay active  Hyperlipidemia previous labs reviewed continue current medication follow-up in 6 months  Reflux under good control continue omeprazole follow-up if problems  Osteopenia bone density reviewed another one not due for 2 years  Persistent lower abdominal pain and discomfort concerning for the possibility of underlying abnormality possible cancer recommend CT scan of the abdomen with contrast

## 2016-06-19 ENCOUNTER — Telehealth: Payer: Self-pay | Admitting: Family Medicine

## 2016-06-19 NOTE — Telephone Encounter (Signed)
Patient given number to scheduling so she can reschedule CT scan for a better day.

## 2016-06-19 NOTE — Telephone Encounter (Signed)
Can't go for CT scan on the 18th because she is going out of town for grand daughters graduation.  Can we reschedule it for the following week?

## 2016-06-27 ENCOUNTER — Telehealth: Payer: Self-pay | Admitting: Family Medicine

## 2016-06-27 MED ORDER — OMEPRAZOLE 40 MG PO CPDR
40.0000 mg | DELAYED_RELEASE_CAPSULE | Freq: Every day | ORAL | 1 refills | Status: DC
Start: 2016-06-27 — End: 2016-11-06

## 2016-06-27 NOTE — Telephone Encounter (Signed)
Patient is needing Rx for omprazole sent to [facility] on [address] Linna Hoff instead of [facility].

## 2016-06-27 NOTE — Telephone Encounter (Signed)
Patient would like prescription sent to the former [facility]. Prescription sent electronically to pharmacy. Patient notified.

## 2016-06-27 NOTE — Telephone Encounter (Signed)
Left message to return call. Clarify pharm.

## 2016-06-30 ENCOUNTER — Ambulatory Visit (HOSPITAL_COMMUNITY): Payer: PPO

## 2016-07-03 ENCOUNTER — Other Ambulatory Visit: Payer: Self-pay | Admitting: *Deleted

## 2016-07-03 ENCOUNTER — Telehealth: Payer: Self-pay | Admitting: Family Medicine

## 2016-07-03 MED ORDER — LEVOTHYROXINE SODIUM 75 MCG PO TABS: 75.0000 ug | ORAL_TABLET | Freq: Every day | ORAL | 1 refills | Status: DC

## 2016-07-03 NOTE — Telephone Encounter (Signed)
Refill sent to pharm. Pt notified on voicemail.

## 2016-07-03 NOTE — Telephone Encounter (Signed)
Pt is requesting 90 day refills on levothyroxine (SYNTHROID, LEVOTHROID) 75 MCG tablet   [facility]

## 2016-07-05 ENCOUNTER — Ambulatory Visit (HOSPITAL_COMMUNITY)
Admission: RE | Admit: 2016-07-05 | Discharge: 2016-07-05 | Disposition: A | Discharge status: Home / Self Care | Payer: PPO | Source: Ambulatory Visit | Attending: Family Medicine | Admitting: Family Medicine

## 2016-07-05 DIAGNOSIS — D259 Leiomyoma of uterus, unspecified: Secondary | ICD-10-CM | POA: Diagnosis not present

## 2016-07-05 DIAGNOSIS — K5730 Diverticulosis of large intestine without perforation or abscess without bleeding: Secondary | ICD-10-CM | POA: Insufficient documentation

## 2016-07-05 DIAGNOSIS — R1030 Lower abdominal pain, unspecified: Secondary | ICD-10-CM | POA: Insufficient documentation

## 2016-07-05 DIAGNOSIS — R109 Unspecified abdominal pain: Secondary | ICD-10-CM | POA: Diagnosis not present

## 2016-07-05 MED ORDER — IOPAMIDOL (ISOVUE-300) INJECTION 61%
100.0000 mL | Freq: Once | INTRAVENOUS | Status: AC | PRN
  Administered 2016-07-05: 100 mL via INTRAVENOUS

## 2016-10-04 ENCOUNTER — Other Ambulatory Visit: Payer: Self-pay | Admitting: Family Medicine

## 2016-10-04 NOTE — Telephone Encounter (Signed)
May have this +2 refills 

## 2016-10-04 NOTE — Telephone Encounter (Signed)
Last seen 06/13/16

## 2016-10-06 ENCOUNTER — Other Ambulatory Visit: Payer: Self-pay | Admitting: Family Medicine

## 2016-10-06 NOTE — Telephone Encounter (Signed)
Last seen 06/13/16

## 2016-10-09 NOTE — Telephone Encounter (Signed)
This +2 refills 

## 2016-11-06 ENCOUNTER — Telehealth: Payer: Self-pay | Admitting: Family Medicine

## 2016-11-06 MED ORDER — OMEPRAZOLE 40 MG PO CPDR: 40.0000 mg | DELAYED_RELEASE_CAPSULE | Freq: Every day | ORAL | 1 refills | Status: DC

## 2016-11-06 NOTE — Telephone Encounter (Signed)
Patient would like Rx for Omeprazole to her new pharmacy [facility].

## 2016-11-06 NOTE — Telephone Encounter (Signed)
Prescription refill sent electronically to pharmacy. Patient notified.

## 2016-11-20 ENCOUNTER — Other Ambulatory Visit: Payer: Self-pay | Admitting: Family Medicine

## 2016-11-20 DIAGNOSIS — Z1231 Encounter for screening mammogram for malignant neoplasm of breast: Secondary | ICD-10-CM

## 2016-12-04 ENCOUNTER — Telehealth: Payer: Self-pay | Admitting: Family Medicine

## 2016-12-04 DIAGNOSIS — E039 Hypothyroidism, unspecified: Secondary | ICD-10-CM

## 2016-12-04 DIAGNOSIS — R5383 Other fatigue: Secondary | ICD-10-CM

## 2016-12-04 DIAGNOSIS — Z79899 Other long term (current) drug therapy: Secondary | ICD-10-CM

## 2016-12-04 DIAGNOSIS — Z13220 Encounter for screening for lipoid disorders: Secondary | ICD-10-CM

## 2016-12-04 DIAGNOSIS — R7303 Prediabetes: Secondary | ICD-10-CM

## 2016-12-04 NOTE — Telephone Encounter (Signed)
Last labs 06/09/16. Lipid, liver, bmp, tsh, a1c

## 2016-12-04 NOTE — Telephone Encounter (Signed)
Patient has an appointment on 12/14/16 with Dr. Nicki Reaper.  She is requesting orders for labs.

## 2016-12-04 NOTE — Telephone Encounter (Signed)
Lipid, liver, metabolic 7, TSH, [id number], hyperlipidemia, hypothyroidism

## 2016-12-05 NOTE — Telephone Encounter (Signed)
Patient is aware orders sent to [facility]

## 2016-12-08 DIAGNOSIS — Z13220 Encounter for screening for lipoid disorders: Secondary | ICD-10-CM | POA: Diagnosis not present

## 2016-12-08 DIAGNOSIS — R7303 Prediabetes: Secondary | ICD-10-CM | POA: Diagnosis not present

## 2016-12-08 DIAGNOSIS — R5383 Other fatigue: Secondary | ICD-10-CM | POA: Diagnosis not present

## 2016-12-08 DIAGNOSIS — Z79899 Other long term (current) drug therapy: Secondary | ICD-10-CM | POA: Diagnosis not present

## 2016-12-09 LAB — BASIC METABOLIC PANEL
BUN / CREAT RATIO: 14 (ref 12–28)
BUN: 9 mg/dL (ref 8–27)
CHLORIDE: 105 mmol/L (ref 96–106)
CO2: 24 mmol/L (ref 20–29)
Calcium: 9.4 mg/dL (ref 8.7–10.3)
Creatinine, Ser: 0.65 mg/dL (ref 0.57–1.00)
GFR calc non Af Amer: 86 mL/min/{1.73_m2} (ref >59)
GFR, EST AFRICAN AMERICAN: 99 mL/min/{1.73_m2} (ref >59)
GLUCOSE: 97 mg/dL (ref 65–99)
POTASSIUM: 4.4 mmol/L (ref 3.5–5.2)
Sodium: 142 mmol/L (ref 134–144)

## 2016-12-09 LAB — LIPID PANEL
CHOL/HDL RATIO: 2.6 ratio (ref 0.0–4.4)
CHOLESTEROL TOTAL: 153 mg/dL (ref 100–199)
HDL: 59 mg/dL (ref >39)
LDL CALC: 79 mg/dL (ref 0–99)
TRIGLYCERIDES: 73 mg/dL (ref 0–149)
VLDL Cholesterol Cal: 15 mg/dL (ref 5–40)

## 2016-12-09 LAB — HEPATIC FUNCTION PANEL
ALT: 16 IU/L (ref 0–32)
AST: 19 IU/L (ref 0–40)
Albumin: 4.4 g/dL (ref 3.5–4.8)
Alkaline Phosphatase: 80 IU/L (ref 39–117)
BILIRUBIN TOTAL: 0.4 mg/dL (ref 0.0–1.2)
BILIRUBIN, DIRECT: 0.14 mg/dL (ref 0.00–0.40)
Total Protein: 6.9 g/dL (ref 6.0–8.5)

## 2016-12-09 LAB — TSH: TSH: 0.617 u[IU]/mL (ref 0.450–4.500)

## 2016-12-14 ENCOUNTER — Encounter: Payer: Self-pay | Admitting: Family Medicine

## 2016-12-14 ENCOUNTER — Ambulatory Visit (INDEPENDENT_AMBULATORY_CARE_PROVIDER_SITE_OTHER): Payer: PPO | Admitting: Family Medicine

## 2016-12-14 VITALS — BP 118/78 | Ht 61.0 in | Wt 134.0 lb

## 2016-12-14 DIAGNOSIS — E039 Hypothyroidism, unspecified: Secondary | ICD-10-CM | POA: Diagnosis not present

## 2016-12-14 DIAGNOSIS — E038 Other specified hypothyroidism: Secondary | ICD-10-CM | POA: Diagnosis not present

## 2016-12-14 DIAGNOSIS — E785 Hyperlipidemia, unspecified: Secondary | ICD-10-CM | POA: Diagnosis not present

## 2016-12-14 DIAGNOSIS — Z0000 Encounter for general adult medical examination without abnormal findings: Secondary | ICD-10-CM | POA: Diagnosis not present

## 2016-12-14 MED ORDER — LEVOTHYROXINE SODIUM 75 MCG PO TABS: 75.0000 ug | ORAL_TABLET | Freq: Every day | ORAL | 1 refills | Status: DC

## 2016-12-14 MED ORDER — ROSUVASTATIN CALCIUM 5 MG PO TABS: 5.0000 mg | ORAL_TABLET | Freq: Every day | ORAL | 3 refills | Status: DC

## 2016-12-14 NOTE — Progress Notes (Signed)
Subjective:    Patient ID: Jordan Grant, female    DOB: [birth year], 76 y.o.   MRN: [id number]  HPI AWV- Annual Wellness Visit  The patient was seen for their annual wellness visit. The patient's past medical history, surgical history, and family history were reviewed. Pertinent vaccines were reviewed ( tetanus, pneumonia, shingles, flu) The patient's medication list was reviewed and updated. Patient does do some walking on a regular basis The height and weight were entered. The patient's current BMI is:25.33  Cognitive screening was completed. Outcome of Mini - Cog: pass  Falls within the past 6 months:no  Current tobacco usage: none (All patients who use tobacco were given written and verbal information on quitting)  Recent listing of emergency department/hospitalizations over the past year were reviewed.  current specialist the patient sees on a regular basis: no   Medicare annual wellness visit patient questionnaire was reviewed.  A written screening schedule for the patient for the next 5-10 years was given. Appropriate discussion of followup regarding next visit was discussed.  Patient does have thyroid issues under good control with medication denies problems Has cholesterol issues takes medicine tolerates well Occasional reflux issues but under good control with omeprazole labs were reviewed with patient in detail    Review of Systems  Constitutional: Negative for activity change, appetite change and fatigue.  HENT: Negative for congestion and rhinorrhea.   Eyes: Negative for discharge.  Respiratory: Negative for cough, chest tightness and wheezing.   Cardiovascular: Negative for chest pain.  Gastrointestinal: Negative for abdominal pain, blood in stool and vomiting.  Endocrine: Negative for polyphagia.  Genitourinary: Negative for difficulty urinating and frequency.  Musculoskeletal: Negative for neck pain.  Skin: Negative for color change.    Allergic/Immunologic: Negative for environmental allergies and food allergies.  Neurological: Negative for weakness and headaches.  Psychiatric/Behavioral: Negative for agitation and behavioral problems.       Objective:   Physical Exam  Constitutional: She is oriented to person, place, and time. She appears well-developed and well-nourished.  HENT:  Head: Normocephalic and atraumatic.  Right Ear: External ear normal.  Left Ear: External ear normal.  Eyes: Right eye exhibits no discharge. Left eye exhibits no discharge.  Neck: Normal range of motion. No tracheal deviation present.  Cardiovascular: Normal rate, regular rhythm, normal heart sounds and intact distal pulses.  Exam reveals no gallop.   No murmur heard. Pulmonary/Chest: Effort normal and breath sounds normal. No stridor. No respiratory distress. She has no wheezes. She has no rales.  Abdominal: Soft. Bowel sounds are normal. She exhibits no distension and no mass. There is no tenderness. There is no rebound and no guarding.  Musculoskeletal: Normal range of motion. She exhibits no edema or tenderness.  Lymphadenopathy:    She has no cervical adenopathy.  Neurological: She is alert and oriented to person, place, and time. She exhibits normal muscle tone.  Skin: Skin is warm and dry.  Psychiatric: She has a normal mood and affect. Her behavior is normal.          Assessment & Plan:  Adult wellness-complete.wellness physical was conducted today. Importance of diet and exercise were discussed in detail. In addition to this a discussion regarding safety was also covered. We also reviewed over immunizations and gave recommendations regarding current immunization needed for age. In addition to this additional areas were also touched on including: Preventative health exams needed: Colonoscopy patient had colonoscopy last year will not need another one Pap smear not indicated  Pelvic exam normal Cholesterol lab work reviewed  continue cholesterol medication Results from bone density read discussed with patient copy was given to the patient Thyroid patient doing well with medicine lab work looks good continue current measures  Patient was advised yearly wellness exam

## 2016-12-18 ENCOUNTER — Ambulatory Visit (HOSPITAL_COMMUNITY)
Admission: RE | Admit: 2016-12-18 | Discharge: 2016-12-18 | Disposition: A | Discharge status: Home / Self Care | Payer: PPO | Source: Ambulatory Visit | Attending: Family Medicine | Admitting: Family Medicine

## 2016-12-18 ENCOUNTER — Encounter (HOSPITAL_COMMUNITY): Payer: Self-pay

## 2016-12-18 DIAGNOSIS — Z1231 Encounter for screening mammogram for malignant neoplasm of breast: Secondary | ICD-10-CM | POA: Insufficient documentation

## 2017-01-01 ENCOUNTER — Other Ambulatory Visit: Payer: Self-pay | Admitting: Family Medicine

## 2017-01-08 ENCOUNTER — Other Ambulatory Visit: Payer: Self-pay | Admitting: Family Medicine

## 2017-01-08 NOTE — Telephone Encounter (Signed)
This +5 refills 

## 2017-01-08 NOTE — Telephone Encounter (Signed)
Last seen 12/14/16

## 2017-03-12 ENCOUNTER — Ambulatory Visit (INDEPENDENT_AMBULATORY_CARE_PROVIDER_SITE_OTHER): Payer: PPO | Admitting: Family Medicine

## 2017-03-12 ENCOUNTER — Encounter: Payer: Self-pay | Admitting: Family Medicine

## 2017-03-12 ENCOUNTER — Ambulatory Visit (HOSPITAL_COMMUNITY)
Admission: RE | Admit: 2017-03-12 | Discharge: 2017-03-12 | Disposition: A | Discharge status: Home / Self Care | Payer: PPO | Source: Ambulatory Visit | Attending: Family Medicine | Admitting: Family Medicine

## 2017-03-12 VITALS — BP 110/72 | Ht 61.0 in | Wt 132.0 lb

## 2017-03-12 DIAGNOSIS — M19042 Primary osteoarthritis, left hand: Secondary | ICD-10-CM | POA: Diagnosis not present

## 2017-03-12 DIAGNOSIS — M1812 Unilateral primary osteoarthritis of first carpometacarpal joint, left hand: Secondary | ICD-10-CM

## 2017-03-12 DIAGNOSIS — M85842 Other specified disorders of bone density and structure, left hand: Secondary | ICD-10-CM | POA: Diagnosis not present

## 2017-03-12 NOTE — Progress Notes (Signed)
   Subjective:    Patient ID: Jordan Grant, female    DOB: 1941-01-26, 77 y.o.   MRN: [id number]  HPIleft thumb pain since November. Has not tried any treatments.  Significant left thumb pain she relates pain in the proximal rectum of the thumb and the PIP joint.  In addition to this she also relates some discomfort in the other joints of the thumb and also it clicks when she bends number finger sometimes it sticks she denies any injury.  Denies having taken any medicine for it.  PMH benign   Review of Systems Thumb pain discomfort denies wrist pain knee pain elbow pain denies chest tightness pressure pain shortness breath fever chills    Objective:   Physical Exam Lungs clear heart regular there is some arthritic changes noted in the thumb and the PIP joint rest of the fingers have a normal appearance to them pulse is normal color normal no cellulitis       Assessment & Plan:  Significant arthritis of the thumb x-ray indicated Await the results of this x-ray May use OTC Aleve twice daily for the course of the next 5-7 days I do not want to use this long-term because of risk of GI bleeding.  May need referral to subspecialist  Follow-up regular checkups  Trigger finger discussed as well

## 2017-04-03 ENCOUNTER — Other Ambulatory Visit: Payer: Self-pay | Admitting: Family Medicine

## 2017-04-25 DIAGNOSIS — L570 Actinic keratosis: Secondary | ICD-10-CM | POA: Diagnosis not present

## 2017-05-02 ENCOUNTER — Telehealth: Payer: Self-pay | Admitting: Family Medicine

## 2017-05-02 DIAGNOSIS — E785 Hyperlipidemia, unspecified: Secondary | ICD-10-CM

## 2017-05-02 DIAGNOSIS — E038 Other specified hypothyroidism: Secondary | ICD-10-CM

## 2017-05-02 DIAGNOSIS — R7303 Prediabetes: Secondary | ICD-10-CM

## 2017-05-02 DIAGNOSIS — Z79899 Other long term (current) drug therapy: Secondary | ICD-10-CM

## 2017-05-02 DIAGNOSIS — R5383 Other fatigue: Secondary | ICD-10-CM

## 2017-05-02 NOTE — Telephone Encounter (Signed)
Pt is requesting lab orders to be sent over for an upcoming appt. Last labs per Epic were tsh,bmp,hepatic,and lipid on 12/08/2016.

## 2017-05-03 NOTE — Telephone Encounter (Signed)
Blood work ordered in Epic. Patient notified. 

## 2017-05-03 NOTE — Telephone Encounter (Signed)
Metabolic 7, liver, lipid, TSH, A1c, CBC- hyperlipidemia hypothyroidism prediabetes fatigue

## 2017-05-07 ENCOUNTER — Telehealth: Payer: Self-pay | Admitting: Family Medicine

## 2017-05-07 ENCOUNTER — Other Ambulatory Visit: Payer: Self-pay | Admitting: *Deleted

## 2017-05-07 MED ORDER — OMEPRAZOLE 40 MG PO CPDR: 40.0000 mg | DELAYED_RELEASE_CAPSULE | Freq: Every day | ORAL | 0 refills | Status: DC

## 2017-05-07 NOTE — Telephone Encounter (Signed)
Refills sent per dr scott. Pt notified.  

## 2017-05-07 NOTE — Telephone Encounter (Signed)
Requesting refill on omeprazole 40 mg caps.  [facility] on [address]

## 2017-05-17 ENCOUNTER — Telehealth: Payer: Self-pay | Admitting: Family Medicine

## 2017-05-17 MED ORDER — TRAMADOL HCL 50 MG PO TABS: ORAL_TABLET | ORAL | 2 refills | Status: DC

## 2017-05-17 NOTE — Telephone Encounter (Signed)
Script printed, awaiting signature. 

## 2017-05-17 NOTE — Telephone Encounter (Signed)
Requesting refill on Tramadol 50 mg tabs.  She said she isn't completely out yet.  [facility] on [address]

## 2017-05-17 NOTE — Telephone Encounter (Signed)
May have 2 refills of tramadol

## 2017-05-18 NOTE — Telephone Encounter (Signed)
Script faxed to pharmacy

## 2017-05-30 DIAGNOSIS — L570 Actinic keratosis: Secondary | ICD-10-CM | POA: Diagnosis not present

## 2017-06-01 DIAGNOSIS — R5383 Other fatigue: Secondary | ICD-10-CM | POA: Diagnosis not present

## 2017-06-01 DIAGNOSIS — R7303 Prediabetes: Secondary | ICD-10-CM | POA: Diagnosis not present

## 2017-06-01 DIAGNOSIS — E038 Other specified hypothyroidism: Secondary | ICD-10-CM | POA: Diagnosis not present

## 2017-06-01 DIAGNOSIS — E785 Hyperlipidemia, unspecified: Secondary | ICD-10-CM | POA: Diagnosis not present

## 2017-06-01 DIAGNOSIS — Z79899 Other long term (current) drug therapy: Secondary | ICD-10-CM | POA: Diagnosis not present

## 2017-06-02 LAB — CBC WITH DIFFERENTIAL/PLATELET
Basophils Absolute: 0 10*3/uL (ref 0.0–0.2)
Basos: 1 %
EOS (ABSOLUTE): 0.1 10*3/uL (ref 0.0–0.4)
EOS: 3 %
HEMATOCRIT: 42.3 % (ref 34.0–46.6)
HEMOGLOBIN: 13.7 g/dL (ref 11.1–15.9)
Immature Grans (Abs): 0 10*3/uL (ref 0.0–0.1)
Immature Granulocytes: 0 %
LYMPHS ABS: 2.3 10*3/uL (ref 0.7–3.1)
Lymphs: 41 %
MCH: 30.3 pg (ref 26.6–33.0)
MCHC: 32.4 g/dL (ref 31.5–35.7)
MCV: 94 fL (ref 79–97)
MONOCYTES: 7 %
Monocytes Absolute: 0.4 10*3/uL (ref 0.1–0.9)
NEUTROS ABS: 2.7 10*3/uL (ref 1.4–7.0)
Neutrophils: 48 %
Platelets: 292 10*3/uL (ref 150–379)
RBC: 4.52 x10E6/uL (ref 3.77–5.28)
RDW: 13.4 % (ref 12.3–15.4)
WBC: 5.5 10*3/uL (ref 3.4–10.8)

## 2017-06-02 LAB — HEPATIC FUNCTION PANEL
ALT: 14 IU/L (ref 0–32)
AST: 19 IU/L (ref 0–40)
Albumin: 4.3 g/dL (ref 3.5–4.8)
Alkaline Phosphatase: 88 IU/L (ref 39–117)
BILIRUBIN TOTAL: 0.4 mg/dL (ref 0.0–1.2)
BILIRUBIN, DIRECT: 0.12 mg/dL (ref 0.00–0.40)
Total Protein: 7 g/dL (ref 6.0–8.5)

## 2017-06-02 LAB — LIPID PANEL
CHOLESTEROL TOTAL: 175 mg/dL (ref 100–199)
Chol/HDL Ratio: 3.2 ratio (ref 0.0–4.4)
HDL: 55 mg/dL (ref >39)
LDL CALC: 97 mg/dL (ref 0–99)
Triglycerides: 117 mg/dL (ref 0–149)
VLDL Cholesterol Cal: 23 mg/dL (ref 5–40)

## 2017-06-02 LAB — BASIC METABOLIC PANEL
BUN / CREAT RATIO: 14 (ref 12–28)
BUN: 9 mg/dL (ref 8–27)
CHLORIDE: 103 mmol/L (ref 96–106)
CO2: 26 mmol/L (ref 20–29)
Calcium: 9.5 mg/dL (ref 8.7–10.3)
Creatinine, Ser: 0.66 mg/dL (ref 0.57–1.00)
GFR calc non Af Amer: 85 mL/min/{1.73_m2} (ref >59)
GFR, EST AFRICAN AMERICAN: 98 mL/min/{1.73_m2} (ref >59)
Glucose: 108 mg/dL — ABNORMAL HIGH (ref 65–99)
POTASSIUM: 4.7 mmol/L (ref 3.5–5.2)
SODIUM: 142 mmol/L (ref 134–144)

## 2017-06-02 LAB — TSH: TSH: 1.28 u[IU]/mL (ref 0.450–4.500)

## 2017-06-02 LAB — HEMOGLOBIN A1C
Est. average glucose Bld gHb Est-mCnc: 137 mg/dL
Hgb A1c MFr Bld: 6.4 % — ABNORMAL HIGH (ref 4.8–5.6)

## 2017-06-12 ENCOUNTER — Encounter: Payer: Self-pay | Admitting: Family Medicine

## 2017-06-12 ENCOUNTER — Ambulatory Visit (INDEPENDENT_AMBULATORY_CARE_PROVIDER_SITE_OTHER): Payer: PPO | Admitting: Family Medicine

## 2017-06-12 VITALS — BP 114/68 | Ht 61.0 in | Wt 133.0 lb

## 2017-06-12 DIAGNOSIS — E038 Other specified hypothyroidism: Secondary | ICD-10-CM | POA: Diagnosis not present

## 2017-06-12 DIAGNOSIS — E785 Hyperlipidemia, unspecified: Secondary | ICD-10-CM | POA: Diagnosis not present

## 2017-06-12 DIAGNOSIS — R7303 Prediabetes: Secondary | ICD-10-CM

## 2017-06-12 MED ORDER — LEVOTHYROXINE SODIUM 75 MCG PO TABS: ORAL_TABLET | ORAL | 1 refills | Status: DC

## 2017-06-12 MED ORDER — ROSUVASTATIN CALCIUM 5 MG PO TABS: 5.0000 mg | ORAL_TABLET | Freq: Every day | ORAL | 3 refills | Status: DC

## 2017-06-12 MED ORDER — TRAMADOL HCL 50 MG PO TABS: ORAL_TABLET | ORAL | 5 refills | Status: DC

## 2017-06-12 NOTE — Progress Notes (Signed)
   Subjective:    Patient ID: Jordan Grant, female    DOB: [birth year], 77 y.o.   MRN: [id number]  HPI AWV- Annual Wellness Visit  The patient was seen for their annual wellness visit. The patient's past medical history, surgical history, and family history were reviewed. Pertinent vaccines were reviewed ( tetanus, pneumonia, shingles, flu) The patient's medication list was reviewed and updated.  The height and weight were entered.  BMI recorded in electronic record elsewhere  Cognitive screening was completed. Outcome of Mini - Cog: Passed   Falls /depression screening electronically recorded within record elsewhere  Current tobacco usage:Former smoker when she was 77 yrs old. (All patients who use tobacco were given written and verbal information on quitting)  Recent listing of emergency department/hospitalizations over the past year were reviewed.  current specialist the patient sees on a regular basis: No   Medicare annual wellness visit patient questionnaire was reviewed.  A written screening schedule for the patient for the next 5-10 years was given. Appropriate discussion of followup regarding next visit was discussed.   Patient states she is not ready for a physical today, She states she just had it done 12/2016 and it was in the note that she did have at that time.  Review of Systems  Constitutional: Negative for activity change, fatigue and fever.  HENT: Negative for congestion.   Respiratory: Negative for cough, chest tightness and shortness of breath.   Cardiovascular: Negative for chest pain and leg swelling.  Gastrointestinal: Negative for abdominal pain.  Skin: Negative for color change.  Neurological: Negative for headaches.  Psychiatric/Behavioral: Negative for behavioral problems.       Objective:   Physical Exam  Constitutional: She appears well-nourished. No distress.  Cardiovascular: Normal rate, regular rhythm and normal heart sounds.  No  murmur heard. Pulmonary/Chest: Effort normal and breath sounds normal. No respiratory distress.  Musculoskeletal: She exhibits no edema.  Lymphadenopathy:    She has no cervical adenopathy.  Neurological: She is alert. She exhibits normal muscle tone.  Psychiatric: Her behavior is normal.  Vitals reviewed.         Assessment & Plan:  Hypothyroidism taking medication doing well with this continue current dose  Hyperlipidemia could be better I have encourage patient to take her cholesterol medicine every single day watch diet closely we will recheck this again in several months  Prediabetes A1c is worse she needs to do a better job of eliminating sweets and increasing her overall activity level.  Overall patient is doing fairly well  Tramadol is used twice daily as needed for joint discomfort but this is the safest alternative because Tylenol level was not doing a good job

## 2017-07-13 ENCOUNTER — Telehealth: Payer: Self-pay | Admitting: Family Medicine

## 2017-07-13 MED ORDER — TRAMADOL HCL 50 MG PO TABS: ORAL_TABLET | ORAL | 2 refills | Status: DC

## 2017-07-13 NOTE — Telephone Encounter (Signed)
Prescription faxed to pharmacy. Patient notified. 

## 2017-07-13 NOTE — Telephone Encounter (Signed)
May have this prescription +3 additional refills, 50 mg tablet, 60 tablets, 1 twice daily.

## 2017-07-13 NOTE — Telephone Encounter (Signed)
Patient is requesting a refill on Tramadol.  She said she will be out next week.  [facility] on [address]

## 2017-08-04 ENCOUNTER — Other Ambulatory Visit: Payer: Self-pay | Admitting: Family Medicine

## 2017-10-22 ENCOUNTER — Telehealth: Payer: Self-pay | Admitting: Family Medicine

## 2017-10-22 NOTE — Telephone Encounter (Signed)
Please refill traMADol (ULTRAM) 50 MG tablet and send to [id number] - [place], [place]

## 2017-10-22 NOTE — Telephone Encounter (Signed)
Please advise 

## 2017-10-23 ENCOUNTER — Other Ambulatory Visit: Payer: Self-pay

## 2017-10-23 MED ORDER — TRAMADOL HCL 50 MG PO TABS: ORAL_TABLET | ORAL | 4 refills | Status: DC

## 2017-10-23 NOTE — Telephone Encounter (Signed)
This and 4 additional refills

## 2017-10-23 NOTE — Telephone Encounter (Signed)
Script printed awaiting signature. Notified patient that we would fax this medication over once provider signs the script. Pt verbalized understanding.

## 2017-10-25 DIAGNOSIS — Z961 Presence of intraocular lens: Secondary | ICD-10-CM | POA: Diagnosis not present

## 2017-10-25 DIAGNOSIS — H52203 Unspecified astigmatism, bilateral: Secondary | ICD-10-CM | POA: Diagnosis not present

## 2017-10-25 DIAGNOSIS — H5213 Myopia, bilateral: Secondary | ICD-10-CM | POA: Diagnosis not present

## 2017-10-25 DIAGNOSIS — H524 Presbyopia: Secondary | ICD-10-CM | POA: Diagnosis not present

## 2017-11-06 ENCOUNTER — Other Ambulatory Visit: Payer: Self-pay | Admitting: Family Medicine

## 2017-11-06 DIAGNOSIS — Z1231 Encounter for screening mammogram for malignant neoplasm of breast: Secondary | ICD-10-CM

## 2017-11-08 LAB — HM DIABETES EYE EXAM

## 2017-12-10 DIAGNOSIS — L570 Actinic keratosis: Secondary | ICD-10-CM | POA: Diagnosis not present

## 2017-12-12 ENCOUNTER — Telehealth: Payer: Self-pay | Admitting: Family Medicine

## 2017-12-12 ENCOUNTER — Other Ambulatory Visit: Payer: Self-pay | Admitting: Family Medicine

## 2017-12-12 DIAGNOSIS — E785 Hyperlipidemia, unspecified: Secondary | ICD-10-CM

## 2017-12-12 DIAGNOSIS — E038 Other specified hypothyroidism: Secondary | ICD-10-CM

## 2017-12-12 DIAGNOSIS — R7303 Prediabetes: Secondary | ICD-10-CM

## 2017-12-12 NOTE — Telephone Encounter (Signed)
Lab orders placed and patient is aware.  °

## 2017-12-12 NOTE — Telephone Encounter (Signed)
Last labs 4/19: CBC with diff, HgbA1c, TSH, Met 7, Liver, Lipid

## 2017-12-12 NOTE — Telephone Encounter (Signed)
A1c, lipid, TSH no other labs necessary at this time

## 2017-12-12 NOTE — Telephone Encounter (Signed)
Pt would like to have labs drawn before her appt on 12/19/17.

## 2017-12-13 DIAGNOSIS — R7303 Prediabetes: Secondary | ICD-10-CM | POA: Diagnosis not present

## 2017-12-13 DIAGNOSIS — E785 Hyperlipidemia, unspecified: Secondary | ICD-10-CM | POA: Diagnosis not present

## 2017-12-13 DIAGNOSIS — E038 Other specified hypothyroidism: Secondary | ICD-10-CM | POA: Diagnosis not present

## 2017-12-14 LAB — HEMOGLOBIN A1C
Est. average glucose Bld gHb Est-mCnc: 134 mg/dL
Hgb A1c MFr Bld: 6.3 % — ABNORMAL HIGH (ref 4.8–5.6)

## 2017-12-14 LAB — LIPID PANEL
CHOL/HDL RATIO: 2.7 ratio (ref 0.0–4.4)
Cholesterol, Total: 171 mg/dL (ref 100–199)
HDL: 63 mg/dL (ref >39)
LDL Calculated: 87 mg/dL (ref 0–99)
TRIGLYCERIDES: 103 mg/dL (ref 0–149)
VLDL CHOLESTEROL CAL: 21 mg/dL (ref 5–40)

## 2017-12-14 LAB — TSH: TSH: 0.946 u[IU]/mL (ref 0.450–4.500)

## 2017-12-19 ENCOUNTER — Encounter: Payer: PPO | Admitting: Family Medicine

## 2017-12-20 ENCOUNTER — Ambulatory Visit (HOSPITAL_COMMUNITY)
Admission: RE | Admit: 2017-12-20 | Discharge: 2017-12-20 | Disposition: A | Discharge status: Home / Self Care | Payer: PPO | Source: Ambulatory Visit | Attending: Family Medicine | Admitting: Family Medicine

## 2017-12-20 DIAGNOSIS — Z1231 Encounter for screening mammogram for malignant neoplasm of breast: Secondary | ICD-10-CM | POA: Diagnosis not present

## 2018-01-07 ENCOUNTER — Other Ambulatory Visit: Payer: Self-pay | Admitting: Family Medicine

## 2018-01-07 NOTE — Telephone Encounter (Signed)
Pt requesting refill for traMADol (ULTRAM) 50 MG tablet. Pt states she has only refilled medication once since 10/23/2017, patient states script expired at the end of October, 2019.  Systems expiration date states 04/21/18. Advise.

## 2018-01-07 NOTE — Telephone Encounter (Signed)
scotts 

## 2018-01-08 ENCOUNTER — Ambulatory Visit (INDEPENDENT_AMBULATORY_CARE_PROVIDER_SITE_OTHER): Payer: PPO | Admitting: Family Medicine

## 2018-01-08 ENCOUNTER — Encounter: Payer: Self-pay | Admitting: Family Medicine

## 2018-01-08 VITALS — BP 122/70 | Ht 61.0 in | Wt 132.0 lb

## 2018-01-08 DIAGNOSIS — E038 Other specified hypothyroidism: Secondary | ICD-10-CM | POA: Diagnosis not present

## 2018-01-08 DIAGNOSIS — R7303 Prediabetes: Secondary | ICD-10-CM

## 2018-01-08 DIAGNOSIS — E785 Hyperlipidemia, unspecified: Secondary | ICD-10-CM | POA: Diagnosis not present

## 2018-01-08 DIAGNOSIS — Z0000 Encounter for general adult medical examination without abnormal findings: Secondary | ICD-10-CM

## 2018-01-08 DIAGNOSIS — M545 Low back pain, unspecified: Secondary | ICD-10-CM

## 2018-01-08 MED ORDER — TRAMADOL HCL 50 MG PO TABS: ORAL_TABLET | ORAL | 4 refills | Status: DC

## 2018-01-08 MED ORDER — ZOSTER VAC RECOMB ADJUVANTED 50 MCG/0.5ML IM SUSR: 0.5000 mL | Freq: Once | INTRAMUSCULAR | 1 refills | Status: AC

## 2018-01-08 NOTE — Progress Notes (Addendum)
Subjective:    Patient ID: Jordan Grant, female    DOB: [birth year], 77 y.o.   MRN: [id number]  HPI AWV- Annual Wellness Visit  The patient was seen for their annual wellness visit. The patient's past medical history, surgical history, and family history were reviewed. Pertinent vaccines were reviewed ( tetanus, pneumonia, shingles, flu) The patient's medication list was reviewed and updated.  The height and weight were entered.  BMI recorded in electronic record elsewhere  Cognitive screening was completed. Outcome of Mini - Cog: pass   Falls /depression screening electronically recorded within record elsewhere  Current tobacco usage:no (All patients who use tobacco were given written and verbal information on quitting)  Recent listing of emergency department/hospitalizations over the past year were reviewed.  current specialist the patient sees on a regular basis: no   Medicare annual wellness visit patient questionnaire was reviewed.  A written screening schedule for the patient for the next 5-10 years was given. Appropriate discussion of followup regarding next visit was discussed.  Results for orders placed or performed in visit on 01/08/18  HM DIABETES EYE EXAM  Result Value Ref Range   HM Diabetic Eye Exam No Retinopathy No Retinopathy   The patient recently had lab work completed including lipid TSH and A1c this was reviewed with the patient she is trying to do a good job watching the sugars in her diet she does walk on a regular basis.  Patient has thyroid condition.  Takes thyroid medication on a regular basis.  States that the proper way.  Relates compliance.  States no negative side effects.  States condition seems to be under good control.  Patient here for follow-up regarding cholesterol.  The patient does have hyperlipidemia.  Patient does try to maintain a reasonable diet.  Patient does take the medication on a regular basis.  Denies missing a dose.  The  patient denies any obvious side effects.  Prior blood work results reviewed with the patient.  The patient is aware of his cholesterol goals and the need to keep it under good control to lessen the risk of disease.   Left lower back back this is intermittent.  Whenever she sits for too long she feels discomfort in her left lower back does not radiate down the leg does not wake her up at night does not create any numbness or weakness.  This is been going on for a while.  At least for a few months.   Review of Systems  Constitutional: Negative for activity change, appetite change and fatigue.  HENT: Negative for congestion and rhinorrhea.   Respiratory: Negative for cough and shortness of breath.   Cardiovascular: Negative for chest pain and leg swelling.  Gastrointestinal: Negative for abdominal pain and diarrhea.  Endocrine: Negative for polydipsia and polyphagia.  Skin: Negative for color change.  Neurological: Negative for dizziness and weakness.  Psychiatric/Behavioral: Negative for behavioral problems and confusion.       Objective:   Physical Exam  Constitutional: She appears well-nourished. No distress.  HENT:  Head: Normocephalic and atraumatic.  Eyes: Right eye exhibits no discharge. Left eye exhibits no discharge.  Neck: No tracheal deviation present.  Cardiovascular: Normal rate, regular rhythm and normal heart sounds.  No murmur heard. Pulmonary/Chest: Effort normal and breath sounds normal. No respiratory distress.  Musculoskeletal: She exhibits no edema.  Lymphadenopathy:    She has no cervical adenopathy.  Neurological: She is alert. Coordination normal.  Skin: Skin is warm and dry.  Psychiatric: She has a normal mood and affect. Her behavior is normal.  Vitals reviewed.  Breast exam normal bilateral Pelvic exam normal Pap smear not indicated       Assessment & Plan:  Adult wellness-complete.wellness physical was conducted today. Importance of diet and  exercise were discussed in detail.  In addition to this a discussion regarding safety was also covered. We also reviewed over immunizations and gave recommendations regarding current immunization needed for age.  In addition to this additional areas were also touched on including: Preventative health exams needed:  Colonoscopy up-to-date  The patient was seen today as part of an evaluation regarding hyperlipidemia.  Recent lab work has been reviewed with the patient as well as the goals for good cholesterol care.  In addition to this medications have been discussed the importance of compliance with diet and medications discussed as well.  Finally the patient is aware that poor control of cholesterol, noncompliance can dramatically increase the risk of complications. The patient will keep regular office visits and the patient does agreed to periodic lab work.  Patient was seen today regarding hypothyroidism.  Importance of healthy diet, regular physical activity was discussed.  Importance of compliance with medication and regular checks regarding this was discussed.   Patient will get flu vaccine at the pharmacy the senior dose  Subjective low back pain and discomfort doubtful that this is a herniated disc it is reasonable to do an x-ray of the pelvis as well as lumbar spine stretching exercises recommended Tylenol as needed may use tramadol when pain is severe  Prediabetes good control currently watch diet check A1c again in 6 months follow-up again in 6 months  Shin Grix vaccine recommended.  Patient was advised yearly wellness exam

## 2018-01-08 NOTE — Patient Instructions (Signed)

## 2018-01-14 ENCOUNTER — Ambulatory Visit (HOSPITAL_COMMUNITY)
Admission: RE | Admit: 2018-01-14 | Discharge: 2018-01-14 | Disposition: A | Discharge status: Home / Self Care | Payer: PPO | Source: Ambulatory Visit | Attending: Family Medicine | Admitting: Family Medicine

## 2018-01-14 DIAGNOSIS — M47816 Spondylosis without myelopathy or radiculopathy, lumbar region: Secondary | ICD-10-CM | POA: Diagnosis not present

## 2018-01-14 DIAGNOSIS — M25552 Pain in left hip: Secondary | ICD-10-CM | POA: Diagnosis not present

## 2018-01-14 DIAGNOSIS — M545 Low back pain: Secondary | ICD-10-CM | POA: Insufficient documentation

## 2018-01-15 NOTE — Addendum Note (Signed)
Addended by: Karle Barr on: 01/15/2018 11:36 AM   Modules accepted: Orders

## 2018-01-16 ENCOUNTER — Encounter: Payer: Self-pay | Admitting: Family Medicine

## 2018-01-23 ENCOUNTER — Ambulatory Visit (HOSPITAL_COMMUNITY): Payer: PPO | Admitting: Physical Therapy

## 2018-01-25 ENCOUNTER — Other Ambulatory Visit: Payer: Self-pay | Admitting: Family Medicine

## 2018-04-09 ENCOUNTER — Other Ambulatory Visit: Payer: Self-pay | Admitting: Family Medicine

## 2018-05-30 ENCOUNTER — Telehealth: Payer: Self-pay | Admitting: Family Medicine

## 2018-05-30 NOTE — Telephone Encounter (Signed)
Error needing an appointment for refills on medications.

## 2018-06-03 ENCOUNTER — Ambulatory Visit (INDEPENDENT_AMBULATORY_CARE_PROVIDER_SITE_OTHER): Payer: PPO | Admitting: Family Medicine

## 2018-06-03 ENCOUNTER — Other Ambulatory Visit: Payer: Self-pay

## 2018-06-03 DIAGNOSIS — R7303 Prediabetes: Secondary | ICD-10-CM | POA: Diagnosis not present

## 2018-06-03 DIAGNOSIS — E785 Hyperlipidemia, unspecified: Secondary | ICD-10-CM

## 2018-06-03 DIAGNOSIS — Z79899 Other long term (current) drug therapy: Secondary | ICD-10-CM

## 2018-06-03 DIAGNOSIS — E038 Other specified hypothyroidism: Secondary | ICD-10-CM | POA: Diagnosis not present

## 2018-06-03 MED ORDER — OMEPRAZOLE 40 MG PO CPDR: DELAYED_RELEASE_CAPSULE | ORAL | 1 refills | Status: DC

## 2018-06-03 MED ORDER — ROSUVASTATIN CALCIUM 5 MG PO TABS: 5.0000 mg | ORAL_TABLET | Freq: Every day | ORAL | 3 refills | Status: DC

## 2018-06-03 MED ORDER — TRAMADOL HCL 50 MG PO TABS: ORAL_TABLET | ORAL | 4 refills | Status: DC

## 2018-06-03 NOTE — Addendum Note (Signed)
Addended by: Carmelina Noun on: 06/03/2018 01:06 PM   Modules accepted: Orders

## 2018-06-03 NOTE — Progress Notes (Signed)
   Subjective:    Patient ID: Jordan Grant, female    DOB: Jul 12, 1940, 78 y.o.   MRN: [id number] Phone visit only video not available Hyperlipidemia  This is a chronic problem. The current episode started more than 1 year ago. There are no compliance problems (takes meds every day, eats healthy and exercises).    Needs refill on tramadol for hip and leg pain.  Patient denies abusing the medicine states she takes it on a regular basis She does take her cholesterol medicine regular basis previous labs reviewed new labs ordered Takes her thyroid medicine regular basis feels energy level doing okay  Doing her best to minimize risk of coronavirus  Pt states no concerns today.   Virtual Visit via Telephone Note  I connected with Jordan Grant on 06/03/18 at 10:30 AM EDT by telephone and verified that I am speaking with the correct person using two identifiers.   I discussed the limitations, risks, security and privacy concerns of performing an evaluation and management service by telephone and the availability of in person appointments. I also discussed with the patient that there may be a patient responsible charge related to this service. The patient expressed understanding and agreed to proceed.   History of Present Illness:    Observations/Objective:   Assessment and Plan:   Follow Up Instructions:    I discussed the assessment and treatment plan with the patient. The patient was provided an opportunity to ask questions and all were answered. The patient agreed with the plan and demonstrated an understanding of the instructions.   The patient was advised to call back or seek an in-person evaluation if the symptoms worsen or if the condition fails to improve as anticipated.  I provided 16 minutes of non-face-to-face time during this encounter.     Review of Systems     Objective:   Physical Exam        Assessment & Plan:  Hyperlipidemia check labs in the  summertime follow-up later in the summer Tramadol refill sent in Continue thyroid medicine Check lab work in the summer

## 2018-07-02 ENCOUNTER — Ambulatory Visit: Payer: PPO | Admitting: Family Medicine

## 2018-07-08 ENCOUNTER — Other Ambulatory Visit: Payer: Self-pay | Admitting: Family Medicine

## 2018-07-09 ENCOUNTER — Ambulatory Visit: Payer: PPO | Admitting: Family Medicine

## 2018-09-09 DIAGNOSIS — E785 Hyperlipidemia, unspecified: Secondary | ICD-10-CM | POA: Diagnosis not present

## 2018-09-09 DIAGNOSIS — Z79899 Other long term (current) drug therapy: Secondary | ICD-10-CM | POA: Diagnosis not present

## 2018-09-09 DIAGNOSIS — E038 Other specified hypothyroidism: Secondary | ICD-10-CM | POA: Diagnosis not present

## 2018-09-09 DIAGNOSIS — R7303 Prediabetes: Secondary | ICD-10-CM | POA: Diagnosis not present

## 2018-09-10 LAB — HEPATIC FUNCTION PANEL
ALT: 17 IU/L (ref 0–32)
AST: 24 IU/L (ref 0–40)
Albumin: 4.5 g/dL (ref 3.7–4.7)
Alkaline Phosphatase: 81 IU/L (ref 39–117)
Bilirubin Total: 0.4 mg/dL (ref 0.0–1.2)
Bilirubin, Direct: 0.13 mg/dL (ref 0.00–0.40)
Total Protein: 7.2 g/dL (ref 6.0–8.5)

## 2018-09-10 LAB — LIPID PANEL
Chol/HDL Ratio: 2.8 ratio (ref 0.0–4.4)
Cholesterol, Total: 158 mg/dL (ref 100–199)
HDL: 57 mg/dL
LDL Calculated: 82 mg/dL (ref 0–99)
Triglycerides: 93 mg/dL (ref 0–149)
VLDL Cholesterol Cal: 19 mg/dL (ref 5–40)

## 2018-09-10 LAB — BASIC METABOLIC PANEL
BUN/Creatinine Ratio: 15 (ref 12–28)
BUN: 10 mg/dL (ref 8–27)
CO2: 24 mmol/L (ref 20–29)
Calcium: 9.6 mg/dL (ref 8.7–10.3)
Chloride: 102 mmol/L (ref 96–106)
Creatinine, Ser: 0.67 mg/dL (ref 0.57–1.00)
GFR calc Af Amer: 97 mL/min/{1.73_m2} (ref >59)
GFR calc non Af Amer: 84 mL/min/{1.73_m2} (ref >59)
Glucose: 98 mg/dL (ref 65–99)
Potassium: 4.6 mmol/L (ref 3.5–5.2)
Sodium: 141 mmol/L (ref 134–144)

## 2018-09-10 LAB — TSH: TSH: 0.818 u[IU]/mL (ref 0.450–4.500)

## 2018-09-10 LAB — HEMOGLOBIN A1C
Est. average glucose Bld gHb Est-mCnc: 128 mg/dL
Hgb A1c MFr Bld: 6.1 % — ABNORMAL HIGH (ref 4.8–5.6)

## 2018-09-14 ENCOUNTER — Encounter: Payer: Self-pay | Admitting: Family Medicine

## 2018-10-06 ENCOUNTER — Other Ambulatory Visit: Payer: Self-pay | Admitting: Family Medicine

## 2018-10-08 NOTE — Telephone Encounter (Signed)
May have 90-day needs to schedule follow-up visit virtual or in person for this fall

## 2018-10-29 ENCOUNTER — Other Ambulatory Visit: Payer: Self-pay | Admitting: Family Medicine

## 2018-10-29 DIAGNOSIS — Z961 Presence of intraocular lens: Secondary | ICD-10-CM | POA: Diagnosis not present

## 2018-11-01 ENCOUNTER — Telehealth: Payer: Self-pay | Admitting: Family Medicine

## 2018-11-01 NOTE — Telephone Encounter (Signed)
Pt states she wants tramadol. I looked in chart and it was sent on 10/29/18 and confirmed by pharm that they received it. Pt states she did not check with pharm but will go by there to get it.

## 2018-11-01 NOTE — Telephone Encounter (Signed)
Patient calling to check on the refill request that was sent over a few days ago from [facility] on [address].  She said she only has one pill left.

## 2018-11-19 ENCOUNTER — Other Ambulatory Visit (HOSPITAL_COMMUNITY): Payer: Self-pay | Admitting: Family Medicine

## 2018-11-19 DIAGNOSIS — Z1231 Encounter for screening mammogram for malignant neoplasm of breast: Secondary | ICD-10-CM

## 2018-12-09 ENCOUNTER — Telehealth: Payer: Self-pay | Admitting: Family Medicine

## 2018-12-09 MED ORDER — TRAMADOL HCL 50 MG PO TABS: ORAL_TABLET | ORAL | 1 refills | Status: DC

## 2018-12-09 NOTE — Telephone Encounter (Signed)
Pt called to request refill on her traMADol (ULTRAM) 50 MG tablet    Please advise & call pt when done     [facility] Dr/[place]

## 2018-12-09 NOTE — Telephone Encounter (Signed)
Medication was signed for

## 2018-12-10 NOTE — Telephone Encounter (Signed)
Patient notified

## 2018-12-23 ENCOUNTER — Other Ambulatory Visit: Payer: Self-pay

## 2018-12-23 ENCOUNTER — Ambulatory Visit (HOSPITAL_COMMUNITY)
Admission: RE | Admit: 2018-12-23 | Discharge: 2018-12-23 | Disposition: A | Discharge status: Home / Self Care | Payer: PPO | Source: Ambulatory Visit | Attending: Family Medicine | Admitting: Family Medicine

## 2018-12-23 DIAGNOSIS — Z1231 Encounter for screening mammogram for malignant neoplasm of breast: Secondary | ICD-10-CM

## 2019-01-04 ENCOUNTER — Other Ambulatory Visit: Payer: Self-pay | Admitting: Family Medicine

## 2019-01-06 NOTE — Telephone Encounter (Signed)
Please schedule and then route back to nurses to send in refill. thanks

## 2019-01-06 NOTE — Telephone Encounter (Signed)
Do 30-day Recommend a virtual visit within the next 30 days

## 2019-01-07 ENCOUNTER — Other Ambulatory Visit: Payer: Self-pay

## 2019-01-07 ENCOUNTER — Other Ambulatory Visit: Payer: Self-pay | Admitting: *Deleted

## 2019-01-07 MED ORDER — OMEPRAZOLE 40 MG PO CPDR: DELAYED_RELEASE_CAPSULE | ORAL | 0 refills | Status: DC

## 2019-01-07 NOTE — Telephone Encounter (Signed)
Pt is scheduled for virtual.   She also needs refill on omeprazole (PRILOSEC) 40 MG capsule

## 2019-01-28 ENCOUNTER — Other Ambulatory Visit: Payer: Self-pay

## 2019-01-28 ENCOUNTER — Ambulatory Visit (INDEPENDENT_AMBULATORY_CARE_PROVIDER_SITE_OTHER): Payer: PPO | Admitting: Family Medicine

## 2019-01-28 DIAGNOSIS — R7303 Prediabetes: Secondary | ICD-10-CM

## 2019-01-28 DIAGNOSIS — E785 Hyperlipidemia, unspecified: Secondary | ICD-10-CM | POA: Diagnosis not present

## 2019-01-28 DIAGNOSIS — E038 Other specified hypothyroidism: Secondary | ICD-10-CM | POA: Diagnosis not present

## 2019-01-28 MED ORDER — TRAMADOL HCL 50 MG PO TABS: ORAL_TABLET | ORAL | 5 refills | Status: DC

## 2019-01-28 MED ORDER — LEVOTHYROXINE SODIUM 75 MCG PO TABS: ORAL_TABLET | ORAL | 1 refills | Status: DC

## 2019-01-28 MED ORDER — OMEPRAZOLE 40 MG PO CPDR: DELAYED_RELEASE_CAPSULE | ORAL | 1 refills | Status: DC

## 2019-01-28 NOTE — Progress Notes (Signed)
   Subjective:    Patient ID: Jordan Grant, female    DOB: 08/05/1940, 78 y.o.   MRN: [id number]  HPI  Patient calls for a follow up on thyroid and cholesterol. Patient states she is doing well with no problems. Patient states she wonders if she needs her blood work repeated. Patient does a good job taking her thyroid medicine on a regular basis takes it effectively denies any problems with it Also does a good job taking her cholesterol medicine minimizing fats in the diet stays very physically active.  Tries to minimize her risk of Covid Virtual Visit via Video Note  I connected with Jordan Grant on 01/28/19 at  9:00 AM EST by a video enabled telemedicine application and verified that I am speaking with the correct person using two identifiers.  Location: Patient: home Provider: office   I discussed the limitations of evaluation and management by telemedicine and the availability of in person appointments. The patient expressed understanding and agreed to proceed.  History of Present Illness:    Observations/Objective:   Assessment and Plan:   Follow Up Instructions:    I discussed the assessment and treatment plan with the patient. The patient was provided an opportunity to ask questions and all were answered. The patient agreed with the plan and demonstrated an understanding of the instructions.   The patient was advised to call back or seek an in-person evaluation if the symptoms worsen or if the condition fails to improve as anticipated.  I provided 15 minutes of non-face-to-face time during this encounter.      Review of Systems  Constitutional: Negative for activity change, appetite change and fatigue.  HENT: Negative for congestion and rhinorrhea.   Respiratory: Negative for cough and shortness of breath.   Cardiovascular: Negative for chest pain and leg swelling.  Gastrointestinal: Negative for abdominal pain and diarrhea.  Endocrine: Negative for  polydipsia and polyphagia.  Skin: Negative for color change.  Neurological: Negative for dizziness and weakness.  Psychiatric/Behavioral: Negative for behavioral problems and confusion.       Objective:   Physical Exam  Today's visit was via telephone Physical exam was not possible for this visit  Fall Risk  01/08/2018 06/12/2017 09/28/2015 01/21/2014  Falls in the past year? 0 No No No  Number falls in past yr: 0 - - -        Assessment & Plan:  Hypothyroidism takes her medication regular basis try to stay physically active  Hyperlipidemia takes her medicine regular basis tries to stay physically active.  Mild osteoarthritis uses tramadol as needed does not abuse it  Reflux good control with omeprazole continues current measures  No need to do lab work currently.  Covid protection discussed.  Follow-up 6 months

## 2019-06-24 ENCOUNTER — Telehealth: Payer: Self-pay | Admitting: Family Medicine

## 2019-06-24 DIAGNOSIS — E038 Other specified hypothyroidism: Secondary | ICD-10-CM

## 2019-06-24 DIAGNOSIS — Z79899 Other long term (current) drug therapy: Secondary | ICD-10-CM

## 2019-06-24 DIAGNOSIS — R7303 Prediabetes: Secondary | ICD-10-CM

## 2019-06-24 DIAGNOSIS — E785 Hyperlipidemia, unspecified: Secondary | ICD-10-CM

## 2019-06-24 NOTE — Telephone Encounter (Signed)
Lipid, liver, metabolic 7, TSH, CBC  Hyperlipidemia hypothyroidism annual wellness

## 2019-06-24 NOTE — Telephone Encounter (Signed)
Pt would like to have lab work done. CPE 6/7

## 2019-06-24 NOTE — Telephone Encounter (Signed)
Last labs 08/2018: Lipid, Liver, Met 7, HgbA1c TSH

## 2019-06-24 NOTE — Telephone Encounter (Signed)
Blood work ordered in Epic. Left message to return call to notify patient. 

## 2019-06-24 NOTE — Telephone Encounter (Signed)
Patient notified

## 2019-07-04 DIAGNOSIS — R7303 Prediabetes: Secondary | ICD-10-CM | POA: Diagnosis not present

## 2019-07-04 DIAGNOSIS — E038 Other specified hypothyroidism: Secondary | ICD-10-CM | POA: Diagnosis not present

## 2019-07-04 DIAGNOSIS — E785 Hyperlipidemia, unspecified: Secondary | ICD-10-CM | POA: Diagnosis not present

## 2019-07-04 DIAGNOSIS — Z79899 Other long term (current) drug therapy: Secondary | ICD-10-CM | POA: Diagnosis not present

## 2019-07-05 LAB — HEPATIC FUNCTION PANEL
ALT: 20 IU/L (ref 0–32)
AST: 26 IU/L (ref 0–40)
Albumin: 4.7 g/dL (ref 3.7–4.7)
Alkaline Phosphatase: 99 IU/L (ref 48–121)
Bilirubin Total: 0.5 mg/dL (ref 0.0–1.2)
Bilirubin, Direct: 0.15 mg/dL (ref 0.00–0.40)
Total Protein: 7.7 g/dL (ref 6.0–8.5)

## 2019-07-05 LAB — CBC WITH DIFFERENTIAL/PLATELET
Basophils Absolute: 0 10*3/uL (ref 0.0–0.2)
Basos: 1 %
EOS (ABSOLUTE): 0.1 10*3/uL (ref 0.0–0.4)
Eos: 2 %
Hematocrit: 41.3 % (ref 34.0–46.6)
Hemoglobin: 13.9 g/dL (ref 11.1–15.9)
Immature Grans (Abs): 0 10*3/uL (ref 0.0–0.1)
Immature Granulocytes: 0 %
Lymphocytes Absolute: 2.3 10*3/uL (ref 0.7–3.1)
Lymphs: 40 %
MCH: 30.8 pg (ref 26.6–33.0)
MCHC: 33.7 g/dL (ref 31.5–35.7)
MCV: 92 fL (ref 79–97)
Monocytes Absolute: 0.5 10*3/uL (ref 0.1–0.9)
Monocytes: 10 %
Neutrophils Absolute: 2.8 10*3/uL (ref 1.4–7.0)
Neutrophils: 47 %
Platelets: 287 10*3/uL (ref 150–450)
RBC: 4.51 x10E6/uL (ref 3.77–5.28)
RDW: 12.4 % (ref 11.7–15.4)
WBC: 5.7 10*3/uL (ref 3.4–10.8)

## 2019-07-05 LAB — BASIC METABOLIC PANEL
BUN/Creatinine Ratio: 18 (ref 12–28)
BUN: 14 mg/dL (ref 8–27)
CO2: 24 mmol/L (ref 20–29)
Calcium: 9.5 mg/dL (ref 8.7–10.3)
Chloride: 101 mmol/L (ref 96–106)
Creatinine, Ser: 0.78 mg/dL (ref 0.57–1.00)
GFR calc Af Amer: 83 mL/min/{1.73_m2} (ref >59)
GFR calc non Af Amer: 72 mL/min/{1.73_m2} (ref >59)
Glucose: 123 mg/dL — ABNORMAL HIGH (ref 65–99)
Potassium: 4.3 mmol/L (ref 3.5–5.2)
Sodium: 139 mmol/L (ref 134–144)

## 2019-07-05 LAB — LIPID PANEL
Chol/HDL Ratio: 2.9 ratio (ref 0.0–4.4)
Cholesterol, Total: 183 mg/dL (ref 100–199)
HDL: 64 mg/dL (ref >39)
LDL Chol Calc (NIH): 100 mg/dL — ABNORMAL HIGH (ref 0–99)
Triglycerides: 104 mg/dL (ref 0–149)
VLDL Cholesterol Cal: 19 mg/dL (ref 5–40)

## 2019-07-05 LAB — TSH: TSH: 1.76 u[IU]/mL (ref 0.450–4.500)

## 2019-07-10 ENCOUNTER — Telehealth: Payer: Self-pay | Admitting: Family Medicine

## 2019-07-10 MED ORDER — LEVOTHYROXINE SODIUM 75 MCG PO TABS: ORAL_TABLET | ORAL | 0 refills | Status: DC

## 2019-07-10 MED ORDER — OMEPRAZOLE 40 MG PO CPDR: DELAYED_RELEASE_CAPSULE | ORAL | 0 refills | Status: DC

## 2019-07-10 NOTE — Telephone Encounter (Signed)
Pt is needing a refill on levothyroxine (SYNTHROID) 75 MCG tablet and omeprazole (PRILOSEC) 40 MG capsule  [facility] [id number] - [place], [place] - [address] AT [place]  Pt has cpe 6/7

## 2019-07-21 ENCOUNTER — Ambulatory Visit (INDEPENDENT_AMBULATORY_CARE_PROVIDER_SITE_OTHER): Payer: PPO | Admitting: Family Medicine

## 2019-07-21 ENCOUNTER — Other Ambulatory Visit: Payer: Self-pay

## 2019-07-21 ENCOUNTER — Encounter: Payer: Self-pay | Admitting: Family Medicine

## 2019-07-21 VITALS — BP 132/80 | Temp 98.1°F | Wt 135.6 lb

## 2019-07-21 DIAGNOSIS — R7303 Prediabetes: Secondary | ICD-10-CM

## 2019-07-21 DIAGNOSIS — Z0000 Encounter for general adult medical examination without abnormal findings: Secondary | ICD-10-CM | POA: Diagnosis not present

## 2019-07-21 DIAGNOSIS — E785 Hyperlipidemia, unspecified: Secondary | ICD-10-CM

## 2019-07-21 DIAGNOSIS — E038 Other specified hypothyroidism: Secondary | ICD-10-CM | POA: Diagnosis not present

## 2019-07-21 DIAGNOSIS — M5432 Sciatica, left side: Secondary | ICD-10-CM | POA: Diagnosis not present

## 2019-07-21 DIAGNOSIS — Z13820 Encounter for screening for osteoporosis: Secondary | ICD-10-CM

## 2019-07-21 LAB — POCT GLYCOSYLATED HEMOGLOBIN (HGB A1C): Hemoglobin A1C: 5.4 % (ref 4.0–5.6)

## 2019-07-21 MED ORDER — OMEPRAZOLE 40 MG PO CPDR: DELAYED_RELEASE_CAPSULE | ORAL | 1 refills | Status: DC

## 2019-07-21 MED ORDER — TRAMADOL HCL 50 MG PO TABS: ORAL_TABLET | ORAL | 5 refills | Status: DC

## 2019-07-21 MED ORDER — LEVOTHYROXINE SODIUM 75 MCG PO TABS: ORAL_TABLET | ORAL | 1 refills | Status: DC

## 2019-07-21 MED ORDER — ROSUVASTATIN CALCIUM 5 MG PO TABS: 5.0000 mg | ORAL_TABLET | Freq: Every day | ORAL | 1 refills | Status: DC

## 2019-07-21 NOTE — Progress Notes (Signed)
Subjective:    Patient ID: Jordan Grant, female    DOB: January 12, 1941, 79 y.o.   MRN: [id number]  HPI  AWV- Annual Wellness Visit  The patient was seen for their annual wellness visit. The patient's past medical history, surgical history, and family history were reviewed. Pertinent vaccines were reviewed ( tetanus, pneumonia, shingles, flu) The patient's medication list was reviewed and updated.  The height and weight were entered.  BMI recorded in electronic record elsewhere  Cognitive screening was completed. Outcome of Mini - Cog: Pass   Falls /depression screening electronically recorded within record elsewhere  Current tobacco usage: None (All patients who use tobacco were given written and verbal information on quitting)  Recent listing of emergency department/hospitalizations over the past year were reviewed.  current specialist the patient sees on a regular basis: none Fall Risk  07/21/2019 01/28/2019 01/07/2019 01/08/2018 06/12/2017  Falls in the past year? 0 0 0 0 No  Comment - - Emmi Telephone Survey: data to providers prior to load - -  Number falls in past yr: - - - 0 -  Follow up Falls evaluation completed Falls evaluation completed - - -     Medicare annual wellness visit patient questionnaire was reviewed.  A written screening schedule for the patient for the next 5-10 years was given. Appropriate discussion of followup regarding next visit was discussed. Patient does have chronic pain with her legs. Defers exam. Review of Systems  Constitutional: Negative for activity change, appetite change and fatigue.  HENT: Negative for congestion and rhinorrhea.   Respiratory: Negative for cough and shortness of breath.   Cardiovascular: Negative for chest pain and leg swelling.  Gastrointestinal: Negative for abdominal pain and diarrhea.  Endocrine: Negative for polydipsia and polyphagia.  Skin: Negative for color change.  Neurological: Negative for dizziness and  weakness.  Psychiatric/Behavioral: Negative for behavioral problems and confusion.       Objective:   Physical Exam Vitals reviewed.  Constitutional:      General: She is not in acute distress. HENT:     Head: Normocephalic and atraumatic.  Eyes:     General:        Right eye: No discharge.        Left eye: No discharge.  Neck:     Trachea: No tracheal deviation.  Cardiovascular:     Rate and Rhythm: Normal rate and regular rhythm.     Heart sounds: Normal heart sounds. No murmur.  Pulmonary:     Effort: Pulmonary effort is normal. No respiratory distress.     Breath sounds: Normal breath sounds.  Lymphadenopathy:     Cervical: No cervical adenopathy.  Skin:    General: Skin is warm and dry.  Neurological:     Mental Status: She is alert.     Coordination: Coordination normal.  Psychiatric:        Behavior: Behavior normal.           Assessment & Plan:  1. Medicare annual wellness visit, subsequent Adult wellness-complete.wellness physical was conducted today. Importance of diet and exercise were discussed in detail.  In addition to this a discussion regarding safety was also covered. We also reviewed over immunizations and gave recommendations regarding current immunization needed for age.  In addition to this additional areas were also touched on including: Preventative health exams needed:  Colonoscopy not indicated  Patient was advised yearly wellness exam   2. Other specified hypothyroidism Taking her medication reliably.  Medication renewed labs  look good  3. Hyperlipidemia, unspecified hyperlipidemia type Watch fats in diet stay active continue rosuvastatin  4. Prediabetes Fasting glucose close to the diabetes threshold but A1c is 5.4 be careful with starches in diet - POCT glycosylated hemoglobin (Hb A1C)  5. Screening for osteoporosis Bone density recommended - DG Bone Density  6. Sciatica of left side Chronic sciatica left side intermittent  takes tramadol does not abuse it no more than 2/day

## 2019-07-31 ENCOUNTER — Ambulatory Visit (HOSPITAL_COMMUNITY)
Admission: RE | Admit: 2019-07-31 | Discharge: 2019-07-31 | Disposition: A | Discharge status: Home / Self Care | Payer: PPO | Source: Ambulatory Visit | Attending: Family Medicine | Admitting: Family Medicine

## 2019-07-31 ENCOUNTER — Other Ambulatory Visit: Payer: Self-pay

## 2019-07-31 DIAGNOSIS — M8588 Other specified disorders of bone density and structure, other site: Secondary | ICD-10-CM | POA: Diagnosis not present

## 2019-07-31 DIAGNOSIS — Z13820 Encounter for screening for osteoporosis: Secondary | ICD-10-CM | POA: Insufficient documentation

## 2019-07-31 DIAGNOSIS — M8589 Other specified disorders of bone density and structure, multiple sites: Secondary | ICD-10-CM | POA: Diagnosis not present

## 2019-08-07 ENCOUNTER — Emergency Department (HOSPITAL_COMMUNITY): Payer: PPO

## 2019-08-07 ENCOUNTER — Telehealth (INDEPENDENT_AMBULATORY_CARE_PROVIDER_SITE_OTHER): Payer: PPO | Admitting: Family Medicine

## 2019-08-07 ENCOUNTER — Other Ambulatory Visit: Payer: Self-pay

## 2019-08-07 ENCOUNTER — Encounter (HOSPITAL_COMMUNITY): Payer: Self-pay | Admitting: *Deleted

## 2019-08-07 ENCOUNTER — Observation Stay (HOSPITAL_COMMUNITY)
Admission: EM | Admit: 2019-08-07 | Discharge: 2019-08-08 | Disposition: A | Discharge status: Home / Self Care | Payer: PPO | Attending: Family Medicine | Admitting: Family Medicine

## 2019-08-07 DIAGNOSIS — R935 Abnormal findings on diagnostic imaging of other abdominal regions, including retroperitoneum: Secondary | ICD-10-CM | POA: Diagnosis not present

## 2019-08-07 DIAGNOSIS — N390 Urinary tract infection, site not specified: Secondary | ICD-10-CM | POA: Insufficient documentation

## 2019-08-07 DIAGNOSIS — J432 Centrilobular emphysema: Secondary | ICD-10-CM | POA: Diagnosis not present

## 2019-08-07 DIAGNOSIS — J984 Other disorders of lung: Secondary | ICD-10-CM | POA: Diagnosis not present

## 2019-08-07 DIAGNOSIS — R0989 Other specified symptoms and signs involving the circulatory and respiratory systems: Secondary | ICD-10-CM | POA: Diagnosis not present

## 2019-08-07 DIAGNOSIS — J189 Pneumonia, unspecified organism: Secondary | ICD-10-CM | POA: Diagnosis present

## 2019-08-07 DIAGNOSIS — R9389 Abnormal findings on diagnostic imaging of other specified body structures: Secondary | ICD-10-CM

## 2019-08-07 DIAGNOSIS — Z79899 Other long term (current) drug therapy: Secondary | ICD-10-CM | POA: Insufficient documentation

## 2019-08-07 DIAGNOSIS — I700 Atherosclerosis of aorta: Secondary | ICD-10-CM | POA: Diagnosis not present

## 2019-08-07 DIAGNOSIS — J9811 Atelectasis: Secondary | ICD-10-CM | POA: Diagnosis not present

## 2019-08-07 DIAGNOSIS — Z20822 Contact with and (suspected) exposure to covid-19: Secondary | ICD-10-CM | POA: Diagnosis not present

## 2019-08-07 DIAGNOSIS — Z87891 Personal history of nicotine dependence: Secondary | ICD-10-CM | POA: Insufficient documentation

## 2019-08-07 DIAGNOSIS — R059 Cough, unspecified: Secondary | ICD-10-CM

## 2019-08-07 DIAGNOSIS — E039 Hypothyroidism, unspecified: Secondary | ICD-10-CM | POA: Diagnosis not present

## 2019-08-07 DIAGNOSIS — A419 Sepsis, unspecified organism: Secondary | ICD-10-CM | POA: Diagnosis not present

## 2019-08-07 DIAGNOSIS — R05 Cough: Secondary | ICD-10-CM | POA: Diagnosis not present

## 2019-08-07 DIAGNOSIS — R911 Solitary pulmonary nodule: Secondary | ICD-10-CM | POA: Diagnosis present

## 2019-08-07 DIAGNOSIS — J209 Acute bronchitis, unspecified: Secondary | ICD-10-CM | POA: Diagnosis present

## 2019-08-07 DIAGNOSIS — R509 Fever, unspecified: Secondary | ICD-10-CM | POA: Diagnosis present

## 2019-08-07 DIAGNOSIS — R000 Tachycardia, unspecified: Secondary | ICD-10-CM | POA: Diagnosis not present

## 2019-08-07 LAB — URINALYSIS, ROUTINE W REFLEX MICROSCOPIC
Bilirubin Urine: NEGATIVE
Glucose, UA: NEGATIVE mg/dL
Ketones, ur: NEGATIVE mg/dL
Nitrite: NEGATIVE
Protein, ur: NEGATIVE mg/dL
Specific Gravity, Urine: >1.046 — ABNORMAL HIGH (ref 1.005–1.030)
pH: 7 (ref 5.0–8.0)

## 2019-08-07 LAB — COMPREHENSIVE METABOLIC PANEL
ALT: 27 U/L (ref 0–44)
AST: 37 U/L (ref 15–41)
Albumin: 4 g/dL (ref 3.5–5.0)
Alkaline Phosphatase: 80 U/L (ref 38–126)
Anion gap: 11 (ref 5–15)
BUN: 10 mg/dL (ref 8–23)
CO2: 24 mmol/L (ref 22–32)
Calcium: 8.4 mg/dL — ABNORMAL LOW (ref 8.9–10.3)
Chloride: 99 mmol/L (ref 98–111)
Creatinine, Ser: 0.61 mg/dL (ref 0.44–1.00)
GFR calc Af Amer: >60 mL/min (ref >60)
GFR calc non Af Amer: >60 mL/min (ref >60)
Glucose, Bld: 102 mg/dL — ABNORMAL HIGH (ref 70–99)
Potassium: 3.7 mmol/L (ref 3.5–5.1)
Sodium: 134 mmol/L — ABNORMAL LOW (ref 135–145)
Total Bilirubin: 0.4 mg/dL (ref 0.3–1.2)
Total Protein: 7.7 g/dL (ref 6.5–8.1)

## 2019-08-07 LAB — CBC WITH DIFFERENTIAL/PLATELET
Abs Immature Granulocytes: 0.01 10*3/uL (ref 0.00–0.07)
Basophils Absolute: 0 10*3/uL (ref 0.0–0.1)
Basophils Relative: 1 %
Eosinophils Absolute: 0 10*3/uL (ref 0.0–0.5)
Eosinophils Relative: 0 %
HCT: 38.6 % (ref 36.0–46.0)
Hemoglobin: 12.4 g/dL (ref 12.0–15.0)
Immature Granulocytes: 0 %
Lymphocytes Relative: 21 %
Lymphs Abs: 1.6 10*3/uL (ref 0.7–4.0)
MCH: 30.5 pg (ref 26.0–34.0)
MCHC: 32.1 g/dL (ref 30.0–36.0)
MCV: 94.8 fL (ref 80.0–100.0)
Monocytes Absolute: 0.9 10*3/uL (ref 0.1–1.0)
Monocytes Relative: 12 %
Neutro Abs: 4.9 10*3/uL (ref 1.7–7.7)
Neutrophils Relative %: 66 %
Platelets: 206 10*3/uL (ref 150–400)
RBC: 4.07 MIL/uL (ref 3.87–5.11)
RDW: 12.7 % (ref 11.5–15.5)
WBC: 7.4 10*3/uL (ref 4.0–10.5)
nRBC: 0 % (ref 0.0–0.2)

## 2019-08-07 LAB — LACTIC ACID, PLASMA: Lactic Acid, Venous: 1.4 mmol/L (ref 0.5–1.9)

## 2019-08-07 LAB — PROTIME-INR
INR: 1.1 (ref 0.8–1.2)
Prothrombin Time: 13.5 seconds (ref 11.4–15.2)

## 2019-08-07 LAB — SARS CORONAVIRUS 2 BY RT PCR (HOSPITAL ORDER, PERFORMED IN ~~LOC~~ HOSPITAL LAB): SARS Coronavirus 2: NEGATIVE

## 2019-08-07 MED ORDER — SODIUM CHLORIDE 0.9 % IV SOLN
2.0000 g | INTRAVENOUS | Status: DC
  Administered 2019-08-07: 2 g via INTRAVENOUS
  Filled 2019-08-07: qty 20

## 2019-08-07 MED ORDER — ACETAMINOPHEN 325 MG PO TABS
650.0000 mg | ORAL_TABLET | Freq: Once | ORAL | Status: AC
  Administered 2019-08-07: 650 mg via ORAL
  Filled 2019-08-07: qty 2

## 2019-08-07 MED ORDER — SODIUM CHLORIDE 0.9 % IV SOLN
500.0000 mg | INTRAVENOUS | Status: DC
  Administered 2019-08-07: 500 mg via INTRAVENOUS
  Filled 2019-08-07: qty 500

## 2019-08-07 MED ORDER — IOHEXOL 300 MG/ML  SOLN
100.0000 mL | Freq: Once | INTRAMUSCULAR | Status: AC | PRN
  Administered 2019-08-07: 100 mL via INTRAVENOUS

## 2019-08-07 NOTE — ED Provider Notes (Signed)
[facility] Provider Note   CSN: [id number] Arrival date & time: 08/07/19  1721     History Chief Complaint  Patient presents with  . Fever  . Cough    Jordan Grant is a 79 y.o. female.  HPI   This patient is a pleasant 79 year old female coming from the office of her primary care physician where she was seen today because of a cough and some chest discomfort.  According to his report, he called me on the phone personally the patient has had about a week of increasing coughing with some chest discomfort and shortness of breath and was noted to have a low-grade temperature of 100.3 with an oxygen of 87% in the office.  She was redirected to the emergency department due to these unstable vital signs for further evaluation and stabilizing care.  The patient last had her second Covid vaccination approximately 4 months ago in the month of February 2021.  She reports that her grandson was around her a week ago when he was sick, she has felt approximately 5 or 6 days of the same that started with a sore throat and then progressed into more of a coughing illness.  She is coughing all night last night and was unable to sleep.  She does not feel particularly short of breath when she walks but has been severely fatigued today.  The symptoms are persistent and gradually worsening, nothing seems to make it better, no medications given prehospital  Past Medical History:  Diagnosis Date  . Anxiety   . Arthritis    hip  . Cataract    hx of  . Diverticulosis 2007   History of diverticula  . GERD (gastroesophageal reflux disease)   . Hyperlipidemia   . Hypothyroidism   . Prediabetes     Patient Active Problem List   Diagnosis Date Noted  . Sciatica of left side 07/21/2019  . Precordial pain 05/09/2013  . Hyperlipidemia 04/21/2013  . Prediabetes 04/21/2013  . GERD (gastroesophageal reflux disease) 04/21/2013  . Hypothyroidism 04/21/2013  . Bowel habit changes  08/30/2011  . Gas 08/30/2011  . Right lower quadrant abdominal pain 08/30/2011    Past Surgical History:  Procedure Laterality Date  . APPENDECTOMY    . CATARACT EXTRACTION W/ INTRAOCULAR LENS  IMPLANT, BILATERAL  12/09   Dr Corrin Parker bilateral  . COLONOSCOPY    . DOPPLER ECHOCARDIOGRAPHY  06/2008   Leg  . HEMORROIDECTOMY    . ML bone density  07/11  . TUBAL LIGATION    . YAG LASER APPLICATION Right 2/42/6834   Procedure: YAG LASER APPLICATION;  Surgeon: Elta Guadeloupe T. Gershon Crane, MD;  Location: [facility];  Service: Ophthalmology;  Laterality: Right;  . YAG LASER APPLICATION Left 1/96/2229   Procedure: YAG LASER APPLICATION;  Surgeon: Elta Guadeloupe T. Gershon Crane, MD;  Location: [facility];  Service: Ophthalmology;  Laterality: Left;     OB History   No obstetric history on file.     Family History  Problem Relation Age of Onset  . Esophageal cancer Mother 33       died at 51  . Stomach cancer Mother 41       removed 1/2 of stomach, distal esophageal ca  . Breast cancer Maternal Aunt   . Colon cancer Neg Hx   . Rectal cancer Neg Hx     Social History   Tobacco Use  . Smoking status: Former Smoker    Types: Cigarettes    Quit date: 04/24/1983  Years since quitting: 36.3  . Smokeless tobacco: Never Used  Substance Use Topics  . Alcohol use: Yes    Alcohol/week: 11.0 standard drinks    Types: 4 Glasses of wine, 7 Standard drinks or equivalent per week  . Drug use: No    Home Medications Prior to Admission medications   Medication Sig Start Date End Date Taking? Authorizing Provider  Lactase (DAIRY DIGESTIVE PO) Take by mouth.    [provider]  levothyroxine (SYNTHROID) 75 MCG tablet 1 qd 07/21/19   Kathyrn Drown, MD  omeprazole (PRILOSEC) 40 MG capsule Take one capsule po qd 07/21/19   Kathyrn Drown, MD  Polyethyl Glycol-Propyl Glycol (SYSTANE OP) Apply to eye.    [provider]  rosuvastatin (CRESTOR) 5 MG tablet Take 1 tablet (5 mg total) by mouth at bedtime. 07/21/19  10/15/23  Kathyrn Drown, MD  traMADol (ULTRAM) 50 MG tablet TAKE 1 TABLET BY MOUTH TWICE DAILY AS NEEDED 07/21/19   Kathyrn Drown, MD    Allergies    Codeine, Darvocet [propoxyphene n-acetaminophen], and Pravachol [pravastatin sodium]  Review of Systems   Review of Systems  All other systems reviewed and are negative.   Physical Exam Updated Vital Signs BP 126/72 (BP Location: Right Arm)   Temp (!) 100.7 F (38.2 C) (Oral)   Resp 18   SpO2 100%   Physical Exam Vitals and nursing note reviewed.  Constitutional:      General: She is not in acute distress.    Appearance: She is well-developed.  HENT:     Head: Normocephalic and atraumatic.     Mouth/Throat:     Pharynx: No oropharyngeal exudate.  Eyes:     General: No scleral icterus.       Right eye: No discharge.        Left eye: No discharge.     Conjunctiva/sclera: Conjunctivae normal.     Pupils: Pupils are equal, round, and reactive to light.  Neck:     Thyroid: No thyromegaly.     Vascular: No JVD.  Cardiovascular:     Rate and Rhythm: Normal rate and regular rhythm.     Heart sounds: Normal heart sounds. No murmur heard.  No friction rub. No gallop.   Pulmonary:     Effort: Pulmonary effort is normal. No respiratory distress.     Breath sounds: Rales present. No wheezing.     Comments: Subtle rales at the bilateral bases, no increased work of breathing, no respiratory distress, speaks in full sentences Abdominal:     General: Bowel sounds are normal. There is no distension.     Palpations: Abdomen is soft. There is no mass.     Tenderness: There is no abdominal tenderness.  Musculoskeletal:        General: No tenderness. Normal range of motion.     Cervical back: Normal range of motion and neck supple.  Lymphadenopathy:     Cervical: No cervical adenopathy.  Skin:    General: Skin is warm and dry.     Findings: No erythema or rash.  Neurological:     Mental Status: She is alert.     Coordination:  Coordination normal.  Psychiatric:        Behavior: Behavior normal.     ED Results / Procedures / Treatments   Labs (all labs ordered are listed, but only abnormal results are displayed) Labs Reviewed  COMPREHENSIVE METABOLIC PANEL - Abnormal; Notable for the following components:  Result Value   Sodium 134 (*)    Glucose, Bld 102 (*)    Calcium 8.4 (*)    All other components within normal limits  URINALYSIS, ROUTINE W REFLEX MICROSCOPIC - Abnormal; Notable for the following components:   Specific Gravity, Urine >1.046 (*)    Hgb urine dipstick MODERATE (*)    Leukocytes,Ua LARGE (*)    Bacteria, UA RARE (*)    Non Squamous Epithelial 0-5 (*)    All other components within normal limits  CULTURE, BLOOD (ROUTINE X 2)  CULTURE, BLOOD (ROUTINE X 2)  SARS CORONAVIRUS 2 BY RT PCR (HOSPITAL ORDER, [facility])  URINE CULTURE  CBC WITH DIFFERENTIAL/PLATELET  LACTIC ACID, PLASMA  PROTIME-INR    EKG None  Radiology CT Angio Chest PE W and/or Wo Contrast  Result Date: 08/07/2019 CLINICAL DATA:  Cough, concern for pneumonia versus PE EXAM: CT ANGIOGRAPHY CHEST WITH CONTRAST TECHNIQUE: Multidetector CT imaging of the chest was performed using the standard protocol during bolus administration of intravenous contrast. Multiplanar CT image reconstructions and MIPs were obtained to evaluate the vascular anatomy. CONTRAST:  184mL OMNIPAQUE IOHEXOL 300 MG/ML  SOLN COMPARISON:  Chest radiograph 08/07/2019, CT 01/17/2007 FINDINGS: Cardiovascular: Satisfactory opacification the pulmonary arteries to the segmental level. No pulmonary artery filling defects are identified. Central pulmonary arteries are normal caliber. Normal heart size. No pericardial effusion. Coronary artery calcifications are present. Atherosclerotic plaque within the normal caliber aorta. No acute luminal abnormality or periaortic stranding. Normal 3 vessel branching of the aortic arch minimal  plaque at the left subclavian artery origin. Left dominant vertebral arteries. Major venous structures are unremarkable. Mediastinum/Nodes: No mediastinal fluid or gas. Normal thyroid gland and thoracic inlet. No acute abnormality of the trachea or esophagus. No worrisome mediastinal, hilar or axillary adenopathy. Lungs/Pleura: Apical predominant centrilobular and paraseptal emphysematous changes. Diffuse airways thickening and scattered secretions are noted as well. Some mild biapical pleuroparenchymal scarring is noted. Part solid nodule seen in the superior segment left lower lobe with few adjacent areas of some branching tree-in-bud nodularity nodule measures approximately 9 mm in size (6/64) additional bandlike areas of opacity are present in the lung bases favoring subsegmental atelectasis or scarring. More dependent atelectatic changes posteriorly. Some mild mosaic attenuation in the lungs could reflect atelectatic changes related to imaging during exhalation or some mild small airways disease. Upper Abdomen: No acute abnormalities present in the visualized portions of the upper abdomen. Musculoskeletal: Chronic superior endplate deformity G18 with approximately 10% maximal height loss unchanged from prior. Multilevel degenerative changes are present in the imaged portions of the spine. No acute or suspicious osseous lesions. No worrisome chest wall lesions. Multilevel degenerative changes are present in the imaged portions of the spine. Additional degenerative changes in the spine. Review of the MIP images confirms the above findings. IMPRESSION: 1. No evidence of acute pulmonary artery filling defects to suggest pulmonary embolism. 2. Part solid nodule in the superior segment left lower lobe with few adjacent areas of some branching tree-in-bud nodularity, favored to reflect an infectious/inflammatory process. Follow-up non-contrast CT recommended at 3-6 months to confirm persistence. If unchanged, and  solid component remains <6 mm, annual CT is recommended until 5 years of stability has been established. If persistent these nodules should be considered highly suspicious if the solid component of the nodule is 6 mm or greater in size and enlarging. This recommendation follows the consensus statement: Guidelines for Management of Incidental Pulmonary Nodules Detected on CT Images: From  the Fleischner Society 2017; Radiology 2017; [phone]. 3. Airways thickening and scattered secretions could reflect acute and/or chronic bronchitic changes. 4. Basilar in subsegmental atelectatic features without other acute intrathoracic process. 5. Emphysema (ICD10-J43.9) 6. Chronic superior endplate deformity Q94 with approximately 10% maximal height loss. 7. Aortic Atherosclerosis (ICD10-I70.0) Electronically Signed   By: Lovena Le M.D.   On: 08/07/2019 22:15   DG Chest Port 1 View  Result Date: 08/07/2019 CLINICAL DATA:  Cough. EXAM: PORTABLE CHEST 1 VIEW COMPARISON:  October 01, 2014 FINDINGS: Mild, diffuse chronic appearing increased lung markings are seen. Very mild atelectasis is noted within the left lung base. There is no evidence of a pleural effusion or pneumothorax. The heart size and mediastinal contours are within normal limits. The visualized skeletal structures are unremarkable. IMPRESSION: Chronic appearing increased lung markings with very mild left basilar atelectasis. Electronically Signed   By: Virgina Norfolk M.D.   On: 08/07/2019 18:27    Procedures .Critical Care Performed by: Noemi Chapel, MD Authorized by: Noemi Chapel, MD   Critical care provider statement:    Critical care time (minutes):  35   Critical care time was exclusive of:  Separately billable procedures and treating other patients and teaching time   Critical care was necessary to treat or prevent imminent or life-threatening deterioration of the following conditions:  Sepsis   Critical care was time spent personally by  me on the following activities:  Blood draw for specimens, development of treatment plan with patient or surrogate, discussions with consultants, evaluation of patient's response to treatment, examination of patient, obtaining history from patient or surrogate, ordering and performing treatments and interventions, ordering and review of laboratory studies, ordering and review of radiographic studies, pulse oximetry, re-evaluation of patient's condition and review of old charts   (including critical care time)  Medications Ordered in ED Medications  cefTRIAXone (ROCEPHIN) 2 g in sodium chloride 0.9 % 100 mL IVPB (0 g Intravenous Stopped 08/07/19 1955)  azithromycin (ZITHROMAX) 500 mg in sodium chloride 0.9 % 250 mL IVPB (0 mg Intravenous Stopped 08/07/19 2129)  iohexol (OMNIPAQUE) 300 MG/ML solution 100 mL (100 mLs Intravenous Contrast Given 08/07/19 2141)  acetaminophen (TYLENOL) tablet 650 mg (650 mg Oral Given 08/07/19 2325)    ED Course  I have reviewed the triage vital signs and the nursing notes.  Pertinent labs & imaging results that were available during my care of the patient were reviewed by me and considered in my medical decision making (see chart for details).    MDM Rules/Calculators/A&P                          Clinically this patient appears well, she does have a fever of 100.7, she has a borderline tachycardia and she is normotensive with an oxygen of 95% on room air.  She has no increased work of breathing but due to her abnormal labs prehospital vital signs and her exam with rales she will need an x-ray and work-up.  It is less likely that she has Covid given her vaccination status thus we will treat for clinical pneumonia as well.  The patient is agreeable to the plan  Covid test is negative, metabolic panel is unremarkable, CBC shows no leukocytosis, lactic acid is 1.4, urinalysis shows some dehydration, large leukocytes, negative nitrites, white blood cells 21-50 with some  bacteria, culture sent  The CT scan does show that there is some possible part solid nodule in the superior  segment of the left lower lobe with some branch tree-in-bud nodularity which "favored to reflect an infectious or inflammatory process".  This patient has ongoing fever to 100.8, ongoing tachycardia ongoing tachypnea and oxygen saturations that are currently 91%.  With the patient meeting multiple SIRS criteria I have favored to give her antibiotics and bring her into the hospital.  I see no signs of endorgan dysfunction other than her oxygen saturations however we know from the CT scan that there is only a mild possible pneumonia and nothing else there.  There is certainly no pulmonary embolism.  She will be admitted to the hospital  Hospitalist paged at 11:25 PM  Jordan Grant was evaluated in Emergency Department on 08/07/2019 for the symptoms described in the history of present illness. She was evaluated in the context of the global COVID-19 pandemic, which necessitated consideration that the patient might be at risk for infection with the SARS-CoV-2 virus that causes COVID-19. Institutional protocols and algorithms that pertain to the evaluation of patients at risk for COVID-19 are in a state of rapid change based on information released by regulatory bodies including the CDC and federal and state organizations. These policies and algorithms were followed during the patient's care in the ED.   Final Clinical Impression(s) / ED Diagnoses Final diagnoses:  Sepsis, due to unspecified organism, unspecified whether acute organ dysfunction present [facility])  Abnormal CT scan, chest  Urinary tract infection without hematuria, site unspecified      Noemi Chapel, MD 08/07/19 2326

## 2019-08-07 NOTE — Progress Notes (Signed)
   Subjective:    Patient ID: Jordan Grant, female    DOB: Jan 11, 1941, 79 y.o.   MRN: [id number]  Cough This is a new problem. Episode onset: 5 days. Associated symptoms comments: congestion. Treatments tried: delsym, robitussin.   Pt kept grandkids [place] and Cher Nakai) Thursday of last week who was sick with a virus.  The patient has had her Covid vaccine She does relate a lot of coughing congestion not feeling good She denies vomiting.  She does relate some body aches and feeling bad.  She relates low energy.  Review of Systems  Respiratory: Positive for cough.   Runny nose cough congestion low energy     Objective:   Physical Exam Oral temperature 100.3 Respiratory rate normal heart rate 106 O2 saturation 87% Crackles left base otherwise lung sounds good Extremities no edema skin warm dry    Assessment & Plan:  Viral syndrome Send off Covid testing-was collected today With crackles in the left base along with low-grade fever and low O2 saturation referral to the ER for further evaluation.  ER doctor spoke with

## 2019-08-07 NOTE — ED Triage Notes (Signed)
Sent from PCP for evaluation of cough and possible pneumonia

## 2019-08-08 ENCOUNTER — Encounter (HOSPITAL_COMMUNITY): Payer: Self-pay | Admitting: Family Medicine

## 2019-08-08 ENCOUNTER — Telehealth: Payer: Self-pay | Admitting: Family Medicine

## 2019-08-08 DIAGNOSIS — J209 Acute bronchitis, unspecified: Secondary | ICD-10-CM | POA: Diagnosis not present

## 2019-08-08 DIAGNOSIS — R911 Solitary pulmonary nodule: Secondary | ICD-10-CM | POA: Diagnosis not present

## 2019-08-08 DIAGNOSIS — J189 Pneumonia, unspecified organism: Secondary | ICD-10-CM | POA: Diagnosis present

## 2019-08-08 DIAGNOSIS — R9389 Abnormal findings on diagnostic imaging of other specified body structures: Secondary | ICD-10-CM | POA: Diagnosis not present

## 2019-08-08 LAB — NOVEL CORONAVIRUS, NAA: SARS-CoV-2, NAA: NOT DETECTED

## 2019-08-08 LAB — SARS-COV-2, NAA 2 DAY TAT

## 2019-08-08 MED ORDER — ENOXAPARIN SODIUM 40 MG/0.4ML ~~LOC~~ SOLN
40.0000 mg | SUBCUTANEOUS | Status: DC
  Filled 2019-08-08: qty 0.4

## 2019-08-08 MED ORDER — PREDNISONE 20 MG PO TABS
40.0000 mg | ORAL_TABLET | Freq: Every day | ORAL | Status: DC
  Administered 2019-08-08: 40 mg via ORAL
  Filled 2019-08-08: qty 2

## 2019-08-08 MED ORDER — PANTOPRAZOLE SODIUM 40 MG PO TBEC
40.0000 mg | DELAYED_RELEASE_TABLET | Freq: Every day | ORAL | Status: DC
  Administered 2019-08-08: 40 mg via ORAL
  Filled 2019-08-08: qty 1

## 2019-08-08 MED ORDER — SODIUM CHLORIDE 0.9 % IV SOLN: 1.0000 g | INTRAVENOUS | Status: DC

## 2019-08-08 MED ORDER — PREDNISONE 20 MG PO TABS: 40.0000 mg | ORAL_TABLET | Freq: Every day | ORAL | Status: DC

## 2019-08-08 MED ORDER — IPRATROPIUM-ALBUTEROL 0.5-2.5 (3) MG/3ML IN SOLN
3.0000 mL | Freq: Four times a day (QID) | RESPIRATORY_TRACT | Status: DC
  Administered 2019-08-08: 3 mL via RESPIRATORY_TRACT
  Filled 2019-08-08: qty 3

## 2019-08-08 MED ORDER — IPRATROPIUM-ALBUTEROL 0.5-2.5 (3) MG/3ML IN SOLN: 3.0000 mL | Freq: Four times a day (QID) | RESPIRATORY_TRACT | Status: DC | PRN

## 2019-08-08 MED ORDER — ALBUTEROL SULFATE HFA 108 (90 BASE) MCG/ACT IN AERS
2.0000 | INHALATION_SPRAY | RESPIRATORY_TRACT | 1 refills | Status: DC | PRN
Start: 2019-08-08 — End: 2020-04-08

## 2019-08-08 MED ORDER — GUAIFENESIN ER 600 MG PO TB12
600.0000 mg | ORAL_TABLET | Freq: Two times a day (BID) | ORAL | Status: DC
  Administered 2019-08-08: 600 mg via ORAL
  Filled 2019-08-08: qty 1

## 2019-08-08 MED ORDER — ACETAMINOPHEN 325 MG PO TABS: 650.0000 mg | ORAL_TABLET | Freq: Four times a day (QID) | ORAL | Status: DC | PRN

## 2019-08-08 MED ORDER — ACETAMINOPHEN 650 MG RE SUPP: 650.0000 mg | Freq: Four times a day (QID) | RECTAL | Status: DC | PRN

## 2019-08-08 MED ORDER — ONDANSETRON HCL 4 MG/2ML IJ SOLN: 4.0000 mg | Freq: Four times a day (QID) | INTRAMUSCULAR | Status: DC | PRN

## 2019-08-08 MED ORDER — ONDANSETRON HCL 4 MG PO TABS: 4.0000 mg | ORAL_TABLET | Freq: Four times a day (QID) | ORAL | Status: DC | PRN

## 2019-08-08 MED ORDER — DEXTROMETHORPHAN HBR 15 MG/5ML PO SYRP
5.0000 mL | ORAL_SOLUTION | Freq: Four times a day (QID) | ORAL | 0 refills | Status: DC | PRN
Start: 2019-08-08 — End: 2020-04-08

## 2019-08-08 MED ORDER — DOXYCYCLINE HYCLATE 100 MG PO CAPS
100.0000 mg | ORAL_CAPSULE | Freq: Two times a day (BID) | ORAL | 0 refills | Status: AC
Start: 2019-08-08 — End: 2019-08-11

## 2019-08-08 MED ORDER — IPRATROPIUM-ALBUTEROL 0.5-2.5 (3) MG/3ML IN SOLN: 3.0000 mL | Freq: Two times a day (BID) | RESPIRATORY_TRACT | Status: DC

## 2019-08-08 MED ORDER — LEVOTHYROXINE SODIUM 75 MCG PO TABS
75.0000 ug | ORAL_TABLET | Freq: Every day | ORAL | Status: DC
  Administered 2019-08-08: 75 ug via ORAL
  Filled 2019-08-08: qty 1

## 2019-08-08 MED ORDER — GUAIFENESIN ER 600 MG PO TB12: 600.0000 mg | ORAL_TABLET | Freq: Two times a day (BID) | ORAL | 0 refills | Status: AC

## 2019-08-08 MED ORDER — SODIUM CHLORIDE 0.9 % IV SOLN: INTRAVENOUS | Status: DC

## 2019-08-08 MED ORDER — ROSUVASTATIN CALCIUM 10 MG PO TABS: 5.0000 mg | ORAL_TABLET | Freq: Every day | ORAL | Status: DC

## 2019-08-08 NOTE — Telephone Encounter (Signed)
Pt added to reminder file

## 2019-08-08 NOTE — Evaluation (Signed)
Physical Therapy Evaluation Patient Details Name: Jordan Grant MRN: [id number] DOB: 1940-08-16 Today's Date: 08/08/2019   History of Present Illness  Jordan Grant  is a 79 y.o. female, with medical history of hypothyroidism, hyperlipidemia, diverticulosis was sent from the PCP office.  Patient was seen there for cough and chest discomfort.  As per patient she has been having cough for past 3 days, and she was also noted to have temperature 100.3 with O2 sats 87% with PCP office.  She was sent to the ED for further evaluation.  Patient had a second Covid vaccination approximately 4 months ago in the month of February 2021.  Patient said that her grandson was around her a week ago and he was sick.    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 Patient ambulates with decreased cadence today and is given min guard assist for safety and balance. She has 1 loss of balance where she scuffed her foot but was able to recover. She is independent for bed mobility and transfers today. Patient's husband present for session. Patient shows good sitting tolerance and sitting balance EOB today. Patient plans on returning to walking routine and possibly going back to the gym soon upon discharge. She is educated on attending outpatient PT if weakness persists in future. Patient discharged to care of nursing for ambulation daily as tolerated for length of stay.     Follow Up Recommendations No PT follow up    Equipment Recommendations  None recommended by PT    Recommendations for Other Services       Precautions / Restrictions Precautions Precautions: Fall Restrictions Weight Bearing Restrictions: No      Mobility  Bed Mobility Overal bed mobility: Independent             General bed mobility comments: HoB elevated  Transfers Overall transfer level: Independent Equipment used: None             General transfer comment: without  assist  Ambulation/Gait Ambulation/Gait assistance: Min guard Gait Distance (Feet): 300 Feet Assistive device: None Gait Pattern/deviations: Decreased step length - right;Decreased step length - left;Decreased stride length Gait velocity: decreased   General Gait Details: slightly slow, labored, minimally SOB at end, No AD, scuffed foot one time but was able to recover, guard for balance/safety  Stairs            Wheelchair Mobility    Modified Rankin (Stroke Patients Only)       Balance Overall balance assessment: Needs assistance Sitting-balance support: No upper extremity supported;Feet supported Sitting balance-Leahy Scale: Normal Sitting balance - Comments: seated EOB   Standing balance support: No upper extremity supported Standing balance-Leahy Scale: Good Standing balance comment: without AD                             Pertinent Vitals/Pain Pain Assessment: No/denies pain    Home Living Family/patient expects to be discharged to:: Private residence Living Arrangements: Spouse/significant other Available Help at Discharge: Family;Available 24 hours/day Type of Home: House Home Access: Stairs to enter Entrance Stairs-Rails: Left Entrance Stairs-Number of Steps: 2 Home Layout: One level Home Equipment: None      Prior Function Level of Independence: Independent         Comments: Independent with ADL, community ambulator     Hand Dominance        Extremity/Trunk Assessment   Upper Extremity Assessment Upper  Extremity Assessment: Overall WFL for tasks assessed    Lower Extremity Assessment Lower Extremity Assessment: Overall WFL for tasks assessed       Communication   Communication: No difficulties  Cognition Arousal/Alertness: Awake/alert Behavior During Therapy: WFL for tasks assessed/performed Overall Cognitive Status: Within Functional Limits for tasks assessed                                         General Comments      Exercises     Assessment/Plan    PT Assessment Patent does not need any further PT services  PT Problem List         PT Treatment Interventions      PT Goals (Current goals can be found in the Care Plan section)  Acute Rehab PT Goals Patient Stated Goal: return home PT Goal Formulation: With patient/family Time For Goal Achievement: 08/08/19 Potential to Achieve Goals: Good    Frequency     Barriers to discharge        Co-evaluation               AM-PAC PT "6 Clicks" Mobility  Outcome Measure Help needed turning from your back to your side while in a flat bed without using bedrails?: None Help needed moving from lying on your back to sitting on the side of a flat bed without using bedrails?: None Help needed moving to and from a bed to a chair (including a wheelchair)?: None Help needed standing up from a chair using your arms (e.g., wheelchair or bedside chair)?: None Help needed to walk in hospital room?: None Help needed climbing 3-5 steps with a railing? : A Little 6 Click Score: 23    End of Session   Activity Tolerance: Patient tolerated treatment well Patient left: in bed;with call bell/phone within reach;with family/visitor present Nurse Communication: Mobility status PT Visit Diagnosis: Unsteadiness on feet (R26.81);Other abnormalities of gait and mobility (R26.89);Muscle weakness (generalized) (M62.81)    Time: 4259-5638 PT Time Calculation (min) (ACUTE ONLY): 13 min   Charges:   PT Evaluation $PT Eval Low Complexity: 1 Low          10:45 AM, 08/08/19 Jordan Grant PT, DPT Physical Therapist at [facility]

## 2019-08-08 NOTE — ED Notes (Signed)
Pt ambulated to bathroom on RA. Sats when pt back in bed and WDL at 93% on RA. Will continue to monitor.

## 2019-08-08 NOTE — ED Notes (Signed)
Pt o2 sats noted to be 88-89% on RA while sleeping. Pt is mouth breather while asleep. Pt placed on 2L [place]. Sats rose to 93%.

## 2019-08-08 NOTE — Discharge Instructions (Signed)
Please follow up with repeat testing CT lungs of the pulmonary nodule in 3 months.     IMPORTANT INFORMATION: PAY CLOSE ATTENTION   PHYSICIAN DISCHARGE INSTRUCTIONS  Follow with Primary care provider  Kathyrn Drown, MD  and other consultants as instructed by your Hospitalist Physician  [place] IF SYMPTOMS COME BACK, WORSEN OR NEW PROBLEM DEVELOPS   Please note: You were cared for by a hospitalist during your hospital stay. Every effort will be made to forward records to your primary care provider.  You can request that your primary care provider send for your hospital records if they have not received them.  Once you are discharged, your primary care physician will handle any further medical issues. Please note that NO REFILLS for any discharge medications will be authorized once you are discharged, as it is imperative that you return to your primary care physician (or establish a relationship with a primary care physician if you do not have one) for your post hospital discharge needs so that they can reassess your need for medications and monitor your lab values.  Please get a complete blood count and chemistry panel checked by your Primary MD at your next visit, and again as instructed by your Primary MD.  Get Medicines reviewed and adjusted: Please take all your medications with you for your next visit with your Primary MD  Laboratory/radiological data: Please request your Primary MD to go over all hospital tests and procedure/radiological results at the follow up, please ask your primary care provider to get all Hospital records sent to his/her office.  In some cases, they will be blood work, cultures and biopsy results pending at the time of your discharge. Please request that your primary care provider follow up on these results.  If you are diabetic, please bring your blood sugar readings with you to your follow up appointment with primary care.     Please call and make your follow up appointments as soon as possible.    Also Note the following: If you experience worsening of your admission symptoms, develop shortness of breath, life threatening emergency, suicidal or homicidal thoughts you must seek medical attention immediately by calling 911 or calling your MD immediately  if symptoms less severe.  You must read complete instructions/literature along with all the possible adverse reactions/side effects for all the Medicines you take and that have been prescribed to you. Take any new Medicines after you have completely understood and accpet all the possible adverse reactions/side effects.   Do not drive when taking Pain medications or sleeping medications (Benzodiazepines)  Do not take more than prescribed Pain, Sleep and Anxiety Medications. It is not advisable to combine anxiety,sleep and pain medications without talking with your primary care practitioner  Special Instructions: If you have smoked or chewed Tobacco  in the last 2 yrs please stop smoking, stop any regular Alcohol  and or any Recreational drug use.  Wear Seat belts while driving.  Do not drive if taking any narcotic, mind altering or controlled substances or recreational drugs or alcohol.

## 2019-08-08 NOTE — ED Notes (Signed)
Pt O2 sats drop to 85-86% while sleeping on RA. Placed pt on 1L [place] while asleep. Sats currently 94%. Will continue to monitor.

## 2019-08-08 NOTE — Discharge Summary (Signed)
Physician Discharge Summary  ALAINA DONATI OVZ:[id number] DOB: 1940/10/21 DOA: 08/07/2019  PCP: Kathyrn Drown, MD  Admit date: 08/07/2019 Discharge date: 08/08/2019  Admitted From:  Home  Disposition: Home   Recommendations for Outpatient Follow-up:  1. Follow up with PCP in 1-2 weeks 2. Please obtain repeat CT lungs in 3 months to follow up pulmonary nodule  Discharge Condition: STABLE   CODE STATUS: FULL    Brief Hospitalization Summary: Please see all hospital notes, images, labs for full details of the hospitalization. Mellie Buccellato  is a 79 y.o. female, with medical history of hypothyroidism, hyperlipidemia, diverticulosis was sent from the PCP office.  Patient was seen there for cough and chest discomfort.  As per patient she has been having cough for past 3 days, and she was also noted to have temperature 100.3 with O2 sats 87% with PCP office.  She was sent to the ED for further evaluation.  Patient had a second Covid vaccination approximately 4 months ago in the month of February 2021.  Patient said that her grandson was around her a week ago and he was sick. She denies chest pain at this time. Denies nausea vomiting or diarrhea. Denies abdominal pain In the ED CTA chest was done which was negative for pulmonary embolism however showed part solid nodule in the superior segment left lower lobe with few adjacent areas of some branching tree-in-bud nodularity favoring infectious/inflammatory process.  Noncontrast CT chest recommended 3 to 6 months to confirm persistence. Patient was started on ceftriaxone and Zithromax for possible pneumonia  She responded well to these treatments and she wants to go home. She is breathing much better and not wheezing.  I spoke with her about the pulmonary nodule and the need for follow up CT lungs in 3 months and she notified her PCP and they made a note to have her CT done in 3 months or so for ongoing surveillance.  She feels well. DC home  with close outpatient follow up.    Discharge Diagnoses:  Principal Problem:   Acute bronchitis Active Problems:   CAP (community acquired pneumonia)   Lung nodule   Discharge Instructions:  Allergies as of 08/08/2019      Reactions   Codeine    Nausea, dizziness   Darvocet [propoxyphene N-acetaminophen]    nausea   Pravachol [pravastatin Sodium]    Joint pain      Medication List    TAKE these medications   albuterol 108 (90 Base) MCG/ACT inhaler Commonly known as: VENTOLIN HFA Inhale 2 puffs into the lungs every 4 (four) hours as needed for wheezing or shortness of breath (cough, shortness of breath or wheezing.).   DAIRY DIGESTIVE PO Take by mouth.   dextromethorphan 15 MG/5ML syrup Take 5 mLs (15 mg total) by mouth 4 (four) times daily as needed for cough.   doxycycline 100 MG capsule Commonly known as: VIBRAMYCIN Take 1 capsule (100 mg total) by mouth 2 (two) times daily for 3 days.   guaiFENesin 600 MG 12 hr tablet Commonly known as: MUCINEX Take 1 tablet (600 mg total) by mouth 2 (two) times daily for 3 days.   levothyroxine 75 MCG tablet Commonly known as: SYNTHROID 1 qd What changed:   how much to take  how to take this  when to take this  additional instructions   omeprazole 40 MG capsule Commonly known as: PRILOSEC Take one capsule po qd What changed:   how much to take  how to  take this  when to take this  additional instructions   rosuvastatin 5 MG tablet Commonly known as: Crestor Take 1 tablet (5 mg total) by mouth at bedtime.   SYSTANE OP Apply 1 drop to eye daily.   traMADol 50 MG tablet Commonly known as: ULTRAM TAKE 1 TABLET BY MOUTH TWICE DAILY AS NEEDED       Follow-up Information    Kathyrn Drown, MD. Schedule an appointment as soon as possible for a visit in 2 week(s).   Specialty: Family Medicine Contact information: [place] [address] [phone]               Allergies  Allergen Reactions  . Codeine     Nausea, dizziness  . Darvocet [Propoxyphene N-Acetaminophen]     nausea  . Pravachol [Pravastatin Sodium]     Joint pain   Allergies as of 08/08/2019      Reactions   Codeine    Nausea, dizziness   Darvocet [propoxyphene N-acetaminophen]    nausea   Pravachol [pravastatin Sodium]    Joint pain      Medication List    TAKE these medications   albuterol 108 (90 Base) MCG/ACT inhaler Commonly known as: VENTOLIN HFA Inhale 2 puffs into the lungs every 4 (four) hours as needed for wheezing or shortness of breath (cough, shortness of breath or wheezing.).   DAIRY DIGESTIVE PO Take by mouth.   dextromethorphan 15 MG/5ML syrup Take 5 mLs (15 mg total) by mouth 4 (four) times daily as needed for cough.   doxycycline 100 MG capsule Commonly known as: VIBRAMYCIN Take 1 capsule (100 mg total) by mouth 2 (two) times daily for 3 days.   guaiFENesin 600 MG 12 hr tablet Commonly known as: MUCINEX Take 1 tablet (600 mg total) by mouth 2 (two) times daily for 3 days.   levothyroxine 75 MCG tablet Commonly known as: SYNTHROID 1 qd What changed:   how much to take  how to take this  when to take this  additional instructions   omeprazole 40 MG capsule Commonly known as: PRILOSEC Take one capsule po qd What changed:   how much to take  how to take this  when to take this  additional instructions   rosuvastatin 5 MG tablet Commonly known as: Crestor Take 1 tablet (5 mg total) by mouth at bedtime.   SYSTANE OP Apply 1 drop to eye daily.   traMADol 50 MG tablet Commonly known as: ULTRAM TAKE 1 TABLET BY MOUTH TWICE DAILY AS NEEDED       Procedures/Studies: CT Angio Chest PE W and/or Wo Contrast  Result Date: 08/07/2019 CLINICAL DATA:  Cough, concern for pneumonia versus PE EXAM: CT ANGIOGRAPHY CHEST WITH CONTRAST TECHNIQUE: Multidetector CT imaging of the chest was performed using the standard protocol during  bolus administration of intravenous contrast. Multiplanar CT image reconstructions and MIPs were obtained to evaluate the vascular anatomy. CONTRAST:  17mL OMNIPAQUE IOHEXOL 300 MG/ML  SOLN COMPARISON:  Chest radiograph 08/07/2019, CT 01/17/2007 FINDINGS: Cardiovascular: Satisfactory opacification the pulmonary arteries to the segmental level. No pulmonary artery filling defects are identified. Central pulmonary arteries are normal caliber. Normal heart size. No pericardial effusion. Coronary artery calcifications are present. Atherosclerotic plaque within the normal caliber aorta. No acute luminal abnormality or periaortic stranding. Normal 3 vessel branching of the aortic arch minimal plaque at the left subclavian artery origin. Left dominant vertebral arteries. Major venous structures are unremarkable. Mediastinum/Nodes: No mediastinal  fluid or gas. Normal thyroid gland and thoracic inlet. No acute abnormality of the trachea or esophagus. No worrisome mediastinal, hilar or axillary adenopathy. Lungs/Pleura: Apical predominant centrilobular and paraseptal emphysematous changes. Diffuse airways thickening and scattered secretions are noted as well. Some mild biapical pleuroparenchymal scarring is noted. Part solid nodule seen in the superior segment left lower lobe with few adjacent areas of some branching tree-in-bud nodularity nodule measures approximately 9 mm in size (6/64) additional bandlike areas of opacity are present in the lung bases favoring subsegmental atelectasis or scarring. More dependent atelectatic changes posteriorly. Some mild mosaic attenuation in the lungs could reflect atelectatic changes related to imaging during exhalation or some mild small airways disease. Upper Abdomen: No acute abnormalities present in the visualized portions of the upper abdomen. Musculoskeletal: Chronic superior endplate deformity Z30 with approximately 10% maximal height loss unchanged from prior. Multilevel  degenerative changes are present in the imaged portions of the spine. No acute or suspicious osseous lesions. No worrisome chest wall lesions. Multilevel degenerative changes are present in the imaged portions of the spine. Additional degenerative changes in the spine. Review of the MIP images confirms the above findings. IMPRESSION: 1. No evidence of acute pulmonary artery filling defects to suggest pulmonary embolism. 2. Part solid nodule in the superior segment left lower lobe with few adjacent areas of some branching tree-in-bud nodularity, favored to reflect an infectious/inflammatory process. Follow-up non-contrast CT recommended at 3-6 months to confirm persistence. If unchanged, and solid component remains <6 mm, annual CT is recommended until 5 years of stability has been established. If persistent these nodules should be considered highly suspicious if the solid component of the nodule is 6 mm or greater in size and enlarging. This recommendation follows the consensus statement: Guidelines for Management of Incidental Pulmonary Nodules Detected on CT Images: From the Fleischner Society 2017; Radiology 2017; 284:228-243. 3. Airways thickening and scattered secretions could reflect acute and/or chronic bronchitic changes. 4. Basilar in subsegmental atelectatic features without other acute intrathoracic process. 5. Emphysema (ICD10-J43.9) 6. 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 7. Aortic Atherosclerosis (ICD10-I70.0) Electronically Signed   By: Lovena Le M.D.   On: 08/07/2019 22:15   DG Bone Density  Result Date: 07/31/2019 EXAM: DUAL X-RAY ABSORPTIOMETRY (DXA) FOR BONE MINERAL DENSITY IMPRESSION: Your patient Weslyn Holsonback completed a BMD test on 07/31/2019 using the [place] (software version: 14.10) manufactured by [employer]. The following summarizes the results of our evaluation. Technologist::TNB PATIENT BIOGRAPHICAL:  Name: Cena, Bruhn Patient ID: [id number] Birth Date: 08-14-1940 Height: 61.0 in. Gender: Female Exam Date: 07/31/2019 Weight: 135.6 lbs. Indications: Advanced Age, Caucasian, Follow up Osteopenia, Height Loss, Low Calcium Intake, Post Menopausal Fractures: Treatments: Synthroid DENSITOMETRY RESULTS: Site      Region     Measured Date Measured Age WHO Classification Young Adult T-score BMD         %Change vs. Previous Significant Change (*) AP Spine L1-L2 07/31/2019 80.4 Osteopenia -1.6 0.969 g/cm2 -0.6% - AP Spine L1-L2 01/31/2016 76.9 Osteopenia -1.6 0.975 g/cm2 -6.6% Yes AP Spine L1-L2 01/26/2014 74.9 Normal -1.0 1.044 g/cm2 -0.9% - AP Spine L1-L2 08/13/2009 70.4 Normal -0.9 1.054 g/cm2 -3.4% - AP Spine L1-L2 03/13/2005 66.0 Normal -0.6 1.091 g/cm2 -2.2% - AP Spine L1-L2 01/29/2002 62.9 Normal -0.4 1.115 g/cm2 - - DualFemur Neck Left 07/31/2019 80.4 Osteopenia -1.4 0.845 g/cm2 -5.3% Yes DualFemur Neck Left 01/31/2016 76.9 Osteopenia -1.1 0.892 g/cm2 -4.1% Yes DualFemur Neck Left 01/26/2014  74.9 Normal -0.8 0.930 g/cm2 -0.3% - DualFemur Neck Left 08/13/2009 70.4 Normal -0.8 0.933 g/cm2 0.1% - DualFemur Neck Left 03/13/2005 66.0 Normal -0.8 0.932 g/cm2 -6.1% Yes DualFemur Neck Left 01/29/2002 62.9 Normal -0.3 0.993 g/cm2 - - DualFemur Total Mean 07/31/2019 80.4 Normal -0.8 0.901 g/cm2 -3.7% Yes DualFemur Total Mean 01/31/2016 76.9 Normal -0.6 0.936 g/cm2 -2.6% Yes DualFemur Total Mean 01/26/2014 74.9 Normal -0.4 0.961 g/cm2 -0.5% - DualFemur Total Mean 08/13/2009 70.4 Normal -0.3 0.966 g/cm2 -2.2% Yes DualFemur Total Mean 03/13/2005 66.0 Normal -0.2 0.988 g/cm2 -3.3% Yes DualFemur Total Mean 01/29/2002 62.9 Normal 0.1 1.022 g/cm2 - - ASSESSMENT: BMD as determined from AP Spine L1-L2 is 0.969 g/cm2 with a T-Score of -1.6. This patient is considered osteopenic according to [facility]) criteria. The scan quality is good. Compared with the prior study on 01/31/2016, the BMD of the lumbar spine  shows no statistically significant change. Compared with the prior study on 01/31/2016, the BMD of the left femoral neck and total mean shows a statistically significant decrease. L3-4 was excluded due to advanced degenerative changes. World Pharmacologist [facility]) criteria for post-menopausal, Caucasian Women: Normal:       T-score at or above -1 SD Osteopenia:   T-score between -1 and -2.5 SD Osteoporosis: T-score at or below -2.5 SD RECOMMENDATIONS: 1. All patients should optimize calcium and vitamin D intake. 2. Consider FDA-approved medical therapies in postmenopausal women and med aged 31 years and older, based on the following: a. A hip or vertebral (clinical or morphometric) fracture b. T-score < -2.5 at the femoral neck or spine after appropriate evaluation to exclude secondary causes c. Low bone mass (T-score between -1.0 and -2.5 at the femoral neck or spine) and a 10-year probability of a hip fracture > 3% or a 10-year probability of a major osteoporosis-related fracture > 20% based on the [place]-adapted WHO algorithm d. Clinician judgment and/or patient preferences may indicate treatment for people with 10-year fracture probabilities above or below these levels FOLLOW-UP: Patients with diagnosis of osteoporosis or at high risk fracture should have regular bone mineral density tests. For patients eligible for Medicare, routine testing is allowed once every 2 years. Testing frequency can be increased to on year for patients who have rapidly progressing disease, those who are receiving medical therapy to restore bone mass, or have additional risk factors. I have reviewed this report, and agree with the above findings. [facility] Your patient CORDIE BEAZLEY completed a FRAX assessment on 07/31/2019 using the [place] (analysis version: 14.10) manufactured by [employer]. The following summarizes the results of our evaluation. PATIENT BIOGRAPHICAL: Name: Davita, Sublett  Patient ID: [id number] Birth Date: 1940-12-28 Height:    61.0 in. Gender:     Female    Age:        79.4       Weight:    135.6 lbs. Ethnicity:  White                            Exam Date: 07/31/2019 FRAX* RESULTS:  (version: 3.5) 10-year Probability of Fracture1 Major Osteoporotic Fracture2 Hip Fracture 13.3% 3.1% Population: [place] (Caucasian) Risk Factors: None Based on Femur (Left) Neck BMD 1 -The 10-year probability of fracture may be lower than reported if the patient has received treatment. 2 -Major Osteoporotic Fracture: Clinical Spine, Forearm, Hip or Shoulder *FRAX is a Materials engineer of the [employer]  for Metabolic Bone Disease, a World Pharmacologist (WHO) [place]. ASSESSMENT: The probability of a major osteoporotic fracture is 13.3% within the next ten years. The probability of a hip fracture is 3.1% within the next ten years. Electronically Signed   By: Marin Olp M.D.   On: 07/31/2019 14:19   DG Chest Port 1 View  Result Date: 08/07/2019 CLINICAL DATA:  Cough. EXAM: PORTABLE CHEST 1 VIEW COMPARISON:  October 01, 2014 FINDINGS: Mild, diffuse chronic appearing increased lung markings are seen. Very mild atelectasis is noted within the left lung base. There is no evidence of a pleural effusion or pneumothorax. The heart size and mediastinal contours are within normal limits. The visualized skeletal structures are unremarkable. IMPRESSION: Chronic appearing increased lung markings with very mild left basilar atelectasis. Electronically Signed   By: Virgina Norfolk M.D.   On: 08/07/2019 18:27      Subjective: Pt says she is feeling much better, She wants to go home.  She denies CP and SOB.    Discharge Exam: Vitals:   08/08/19 0500 08/08/19 0543  BP: 112/66 (!) 101/59  Pulse: 88 83  Resp: 20 18  Temp:  97.9 F (36.6 C)  SpO2: 90% 98%   Vitals:   08/08/19 0430 08/08/19 0500 08/08/19 0540 08/08/19 0543  BP: 112/67 112/66  (!) 101/59   Pulse: 88 88  83  Resp: 20 20  18   Temp:    97.9 F (36.6 C)  TempSrc:    Oral  SpO2: (!) 87% 90%  98%  Weight:   60.6 kg    General: Pt is alert, awake, not in acute distress Cardiovascular: RRR, S1/S2 +, no rubs, no gallops Respiratory: CTA bilaterally, no wheezing, no rhonchi Abdominal: Soft, NT, ND, bowel sounds + Extremities: no edema, no cyanosis   The results of significant diagnostics from this hospitalization (including imaging, microbiology, ancillary and laboratory) are listed below for reference.     Microbiology: Recent Results (from the past 240 hour(s))  Novel Coronavirus, NAA ([facility])     Status: None   Collection Time: 08/07/19 12:00 AM   Specimen: Nasopharyngeal(NP) swabs in vial transport medium   Nasopharynge  Is this  Result Value Ref Range Status   SARS-CoV-2, NAA Not Detected Not Detected Final    Comment: This nucleic acid amplification test was developed and its performance characteristics determined by [employer] and [employer]. Nucleic acid amplification tests include RT-PCR and TMA. This test has not been FDA cleared or approved. This test has been authorized by FDA under an Emergency Use Authorization (EUA). This test is only authorized for the duration of time the declaration that circumstances exist justifying the authorization of the emergency use of in vitro diagnostic tests for detection of SARS-CoV-2 virus and/or diagnosis of COVID-19 infection under section 564(b)(1) of the Act, 21 U.S.C. 865HQI-6(N) (1), unless the authorization is terminated or revoked sooner. When diagnostic testing is negative, the possibility of a false negative result should be considered in the context of a patient's recent exposures and the presence of clinical signs and symptoms consistent with COVID-19. An individual without symptoms of COVID-19 and who is not shedding SARS-CoV-2 virus wo uld expect to have a negative (not detected) result in this assay.    SARS-COV-2, NAA 2 DAY TAT     Status: None   Collection Time: 08/07/19 12:00 AM   Nasopharynge  Is this  Result Value Ref Range Status   SARS-CoV-2, NAA 2 DAY TAT Performed  Final  Blood  Culture (routine x 2)     Status: None (Preliminary result)   Collection Time: 08/07/19  6:00 PM   Specimen: BLOOD RIGHT FOREARM  Result Value Ref Range Status   Specimen Description BLOOD RIGHT FOREARM  Final   Special Requests   Final    BOTTLES DRAWN AEROBIC AND ANAEROBIC Blood Culture adequate volume   Culture   Final    NO GROWTH < 12 HOURS Performed at [facility], [address] [place], [place] [address]    Report Status PENDING  Incomplete  SARS Coronavirus 2 by RT PCR (hospital order, performed in [place] hospital lab) Nasopharyngeal Nasopharyngeal Swab     Status: None   Collection Time: 08/07/19  6:02 PM   Specimen: Nasopharyngeal Swab  Result Value Ref Range Status   SARS Coronavirus 2 NEGATIVE NEGATIVE Final    Comment: (NOTE) SARS-CoV-2 target nucleic acids are NOT DETECTED.  The SARS-CoV-2 RNA is generally detectable in upper and lower respiratory specimens during the acute phase of infection. The lowest concentration of SARS-CoV-2 viral copies this assay can detect is 250 copies / mL. A negative result does not preclude SARS-CoV-2 infection and should not be used as the sole basis for treatment or other patient management decisions.  A negative result may occur with improper specimen collection / handling, submission of specimen other than nasopharyngeal swab, presence of viral mutation(s) within the areas targeted by this assay, and inadequate number of viral copies (<250 copies / mL). A negative result must be combined with clinical observations, patient history, and epidemiological information.  Fact Sheet for Patients:   StrictlyIdeas.no  Fact Sheet for Healthcare Providers: BankingDealers.co.za  This test is not yet  approved or  cleared by the [place] FDA and has been authorized for detection and/or diagnosis of SARS-CoV-2 by FDA under an Emergency Use Authorization (EUA).  This EUA will remain in effect (meaning this test can be used) for the duration of the COVID-19 declaration under Section 564(b)(1) of the Act, 21 U.S.C. section 360bbb-3(b)(1), unless the authorization is terminated or revoked sooner.  Performed at [facility], [address], [place], [place] [address]   Blood Culture (routine x 2)     Status: None (Preliminary result)   Collection Time: 08/07/19  6:58 PM   Specimen: BLOOD LEFT FOREARM  Result Value Ref Range Status   Specimen Description BLOOD LEFT FOREARM  Final   Special Requests   Final    BOTTLES DRAWN AEROBIC AND ANAEROBIC Blood Culture adequate volume   Culture   Final    NO GROWTH < 12 HOURS Performed at [facility], [address], [place], [place] [address]    Report Status PENDING  Incomplete     Labs: BNP (last 3 results) No results for input(s): BNP in the last 8760 hours. Basic Metabolic Panel: Recent Labs  Lab 08/07/19 1805  NA 134*  K 3.7  CL 99  CO2 24  GLUCOSE 102*  BUN 10  CREATININE 0.61  CALCIUM 8.4*   Liver Function Tests: Recent Labs  Lab 08/07/19 1805  AST 37  ALT 27  ALKPHOS 80  BILITOT 0.4  PROT 7.7  ALBUMIN 4.0   No results for input(s): LIPASE, AMYLASE in the last 168 hours. No results for input(s): AMMONIA in the last 168 hours. CBC: Recent Labs  Lab 08/07/19 1805  WBC 7.4  NEUTROABS 4.9  HGB 12.4  HCT 38.6  MCV 94.8  PLT 206   Cardiac Enzymes: No results for  input(s): CKTOTAL, CKMB, CKMBINDEX, TROPONINI in the last 168 hours. BNP: Invalid input(s): POCBNP CBG: No results for input(s): GLUCAP in the last 168 hours. D-Dimer No results for input(s): DDIMER in the last 72 hours. Hgb A1c No results for input(s): HGBA1C in the last 72 hours. Lipid Profile No results for input(s): CHOL, HDL, LDLCALC,  TRIG, CHOLHDL, LDLDIRECT in the last 72 hours. Thyroid function studies No results for input(s): TSH, T4TOTAL, T3FREE, THYROIDAB in the last 72 hours.  Invalid input(s): FREET3 Anemia work up No results for input(s): VITAMINB12, FOLATE, FERRITIN, TIBC, IRON, RETICCTPCT in the last 72 hours. Urinalysis    Component Value Date/Time   COLORURINE YELLOW 08/07/2019 1800   APPEARANCEUR CLEAR 08/07/2019 1800   LABSPEC >1.046 (H) 08/07/2019 1800   PHURINE 7.0 08/07/2019 1800   GLUCOSEU NEGATIVE 08/07/2019 1800   HGBUR MODERATE (A) 08/07/2019 1800   BILIRUBINUR NEGATIVE 08/07/2019 1800   KETONESUR NEGATIVE 08/07/2019 1800   PROTEINUR NEGATIVE 08/07/2019 1800   UROBILINOGEN 0.2 05/17/2013 2030   NITRITE NEGATIVE 08/07/2019 1800   LEUKOCYTESUR LARGE (A) 08/07/2019 1800   Sepsis Labs Invalid input(s): PROCALCITONIN,  WBC,  LACTICIDVEN Microbiology Recent Results (from the past 240 hour(s))  Novel Coronavirus, NAA ([facility])     Status: None   Collection Time: 08/07/19 12:00 AM   Specimen: Nasopharyngeal(NP) swabs in vial transport medium   Nasopharynge  Is this  Result Value Ref Range Status   SARS-CoV-2, NAA Not Detected Not Detected Final    Comment: This nucleic acid amplification test was developed and its performance characteristics determined by [employer], [employer] and Company. Nucleic acid amplification tests include RT-PCR and TMA. This test has not been FDA cleared or approved. This test has been authorized by FDA under an Emergency Use Authorization (EUA). This test is only authorized for the duration of time the declaration that circumstances exist justifying the authorization of the emergency use of in vitro diagnostic tests for detection of SARS-CoV-2 virus and/or diagnosis of COVID-19 infection under section 564(b)(1) of the Act, 21 U.S.C. 371IRC-7(E) (1), unless the authorization is terminated or revoked sooner. When diagnostic testing is negative, the possibility of a  false negative result should be considered in the context of a patient's recent exposures and the presence of clinical signs and symptoms consistent with COVID-19. An individual without symptoms of COVID-19 and who is not shedding SARS-CoV-2 virus wo uld expect to have a negative (not detected) result in this assay.   SARS-COV-2, NAA 2 DAY TAT     Status: None   Collection Time: 08/07/19 12:00 AM   Nasopharynge  Is this  Result Value Ref Range Status   SARS-CoV-2, NAA 2 DAY TAT Performed  Final  Blood Culture (routine x 2)     Status: None (Preliminary result)   Collection Time: 08/07/19  6:00 PM   Specimen: BLOOD RIGHT FOREARM  Result Value Ref Range Status   Specimen Description BLOOD RIGHT FOREARM  Final   Special Requests   Final    BOTTLES DRAWN AEROBIC AND ANAEROBIC Blood Culture adequate volume   Culture   Final    NO GROWTH < 12 HOURS Performed at [facility], [address] [place], [place] [address]    Report Status PENDING  Incomplete  SARS Coronavirus 2 by RT PCR (hospital order, performed in [place] hospital lab) Nasopharyngeal Nasopharyngeal Swab     Status: None   Collection Time: 08/07/19  6:02 PM   Specimen: Nasopharyngeal Swab  Result Value Ref Range Status  SARS Coronavirus 2 NEGATIVE NEGATIVE Final    Comment: (NOTE) SARS-CoV-2 target nucleic acids are NOT DETECTED.  The SARS-CoV-2 RNA is generally detectable in upper and lower respiratory specimens during the acute phase of infection. The lowest concentration of SARS-CoV-2 viral copies this assay can detect is 250 copies / mL. A negative result does not preclude SARS-CoV-2 infection and should not be used as the sole basis for treatment or other patient management decisions.  A negative result may occur with improper specimen collection / handling, submission of specimen other than nasopharyngeal swab, presence of viral mutation(s) within the areas targeted by this assay, and inadequate number of  viral copies (<250 copies / mL). A negative result must be combined with clinical observations, patient history, and epidemiological information.  Fact Sheet for Patients:   StrictlyIdeas.no  Fact Sheet for Healthcare Providers: BankingDealers.co.za  This test is not yet approved or  cleared by the [place] FDA and has been authorized for detection and/or diagnosis of SARS-CoV-2 by FDA under an Emergency Use Authorization (EUA).  This EUA will remain in effect (meaning this test can be used) for the duration of the COVID-19 declaration under Section 564(b)(1) of the Act, 21 U.S.C. section 360bbb-3(b)(1), unless the authorization is terminated or revoked sooner.  Performed at [facility], [address] [place], [place] [address]   Blood Culture (routine x 2)     Status: None (Preliminary result)   Collection Time: 08/07/19  6:58 PM   Specimen: BLOOD LEFT FOREARM  Result Value Ref Range Status   Specimen Description BLOOD LEFT FOREARM  Final   Special Requests   Final    BOTTLES DRAWN AEROBIC AND ANAEROBIC Blood Culture adequate volume   Culture   Final    NO GROWTH < 12 HOURS Performed at [facility], [address], [place], [place] [address]    Report Status PENDING  Incomplete   Time coordinating discharge:   SIGNED:  Irwin Brakeman, MD  [facility] 08/08/2019, 12:20 PM How to contact the [facility] Attending or Consulting provider [place] or covering provider during after hours [place], for this patient?  1. Check the care team in [facility] and look for a) attending/consulting [facility] provider listed and b) the [facility] team listed 2. Log into [email or web address] and use [place]'s universal password to access. If you do not have the password, please contact the hospital operator. 3. Locate the [facility] provider you are looking for under [facility] and page to a number that you can be directly reached. 4. If you still have  difficulty reaching the provider, please page the [facility] (Director on Call) for the Hospitalists listed on amion for assistance.

## 2019-08-08 NOTE — ED Notes (Signed)
Per Dr. Darrick Meigs, attempt to wean O2 back to RA. Oxygen lowered to 1L.

## 2019-08-08 NOTE — Plan of Care (Signed)

## 2019-08-08 NOTE — H&P (Addendum)
TRH H&P    Patient Demographics:    Jordan Grant, is a 79 y.o. female  MRN: [id number]  DOB - 12-10-1940  Admit Date - 08/07/2019  Referring MD/NP/PA: Noemi Chapel  Outpatient Primary MD for the patient is Kathyrn Drown, MD  Patient coming from: PCP office  Chief complaint-fever, cough   HPI:    Jordan Grant  is a 79 y.o. female, with medical history of hypothyroidism, hyperlipidemia, diverticulosis was sent from the PCP office.  Patient was seen there for cough and chest discomfort.  As per patient she has been having cough for past 3 days, and she was also noted to have temperature 100.3 with O2 sats 87% with PCP office.  She was sent to the ED for further evaluation.  Patient had a second Covid vaccination approximately 4 months ago in the month of February 2021.  Patient said that her grandson was around her a week ago and he was sick. She denies chest pain at this time. Denies nausea vomiting or diarrhea. Denies abdominal pain In the ED CTA chest was done which was negative for pulmonary embolism however showed part solid nodule in the superior segment left lower lobe with few adjacent areas of some branching tree-in-bud nodularity favoring infectious/inflammatory process.  Noncontrast CT chest recommended 3 to 6 months to confirm persistence. Patient was started on ceftriaxone and Zithromax for possible pneumonia    Review of systems:    In addition to the HPI above,    All other systems reviewed and are negative.    Past History of the following :    Past Medical History:  Diagnosis Date   Anxiety    Arthritis    hip   Cataract    hx of   Diverticulosis 2007   History of diverticula   GERD (gastroesophageal reflux disease)    Hyperlipidemia    Hypothyroidism    Prediabetes       Past Surgical History:  Procedure Laterality Date   APPENDECTOMY     CATARACT  EXTRACTION W/ INTRAOCULAR LENS  IMPLANT, BILATERAL  12/09   Dr Corrin Parker bilateral   COLONOSCOPY     DOPPLER ECHOCARDIOGRAPHY  06/2008   Leg   HEMORROIDECTOMY     ML bone density  07/11   TUBAL LIGATION     YAG LASER APPLICATION Right 6/56/8127   Procedure: YAG LASER APPLICATION;  Surgeon: Elta Guadeloupe T. Gershon Crane, MD;  Location: [facility];  Service: Ophthalmology;  Laterality: Right;   YAG LASER APPLICATION Left 06/30/15   Procedure: YAG LASER APPLICATION;  Surgeon: Elta Guadeloupe T. Gershon Crane, MD;  Location: [facility];  Service: Ophthalmology;  Laterality: Left;      Social History:      Social History   Tobacco Use   Smoking status: Former Smoker    Types: Cigarettes    Quit date: 04/24/1983    Years since quitting: 36.3   Smokeless tobacco: Never Used  Substance Use Topics   Alcohol use: Yes    Alcohol/week: 11.0 standard drinks    Types: 4 Glasses  of wine, 7 Standard drinks or equivalent per week       Family History :     Family History  Problem Relation Age of Onset   Esophageal cancer Mother 31       died at 59   Stomach cancer Mother 77       removed 1/2 of stomach, distal esophageal ca   Breast cancer Maternal Aunt    Colon cancer Neg Hx    Rectal cancer Neg Hx       Home Medications:   Prior to Admission medications   Medication Sig Start Date End Date Taking? Authorizing Provider  Lactase (DAIRY DIGESTIVE PO) Take by mouth.    [provider]  levothyroxine (SYNTHROID) 75 MCG tablet 1 qd 07/21/19   Kathyrn Drown, MD  omeprazole (PRILOSEC) 40 MG capsule Take one capsule po qd 07/21/19   Kathyrn Drown, MD  Polyethyl Glycol-Propyl Glycol (SYSTANE OP) Apply to eye.    [provider]  rosuvastatin (CRESTOR) 5 MG tablet Take 1 tablet (5 mg total) by mouth at bedtime. 07/21/19 10/15/23  Kathyrn Drown, MD  traMADol (ULTRAM) 50 MG tablet TAKE 1 TABLET BY MOUTH TWICE DAILY AS NEEDED 07/21/19   Kathyrn Drown, MD     Allergies:     Allergies    Allergen Reactions   Codeine     Nausea, dizziness   Darvocet [Propoxyphene N-Acetaminophen]     nausea   Pravachol [Pravastatin Sodium]     Joint pain     Physical Exam:   Vitals  Blood pressure 106/61, pulse 74, temperature 98 F (36.7 C), temperature source Oral, resp. rate 15, SpO2 97 %.  1.  General: Appears in no acute distress  2. Psychiatric: Alert, oriented x3, intact insight and judgment  3. Neurologic: Cranial nerves II through XII grossly intact, no focal deficit noted  4. HEENMT:  Atraumatic normocephalic, extraocular muscles are intact  5. Respiratory : Scattered rhonchi auscultated bilaterally  6. Cardiovascular : S1-S2, regular, no murmur auscultated, no edema in the lower extremities  7. Gastrointestinal:  Abdomen is soft, nontender, no organomegaly  8. Skin:  No rashes noted      Data Review:    CBC Recent Labs  Lab 08/07/19 1805  WBC 7.4  HGB 12.4  HCT 38.6  PLT 206  MCV 94.8  MCH 30.5  MCHC 32.1  RDW 12.7  LYMPHSABS 1.6  MONOABS 0.9  EOSABS 0.0  BASOSABS 0.0   ------------------------------------------------------------------------------------------------------------------  Results for orders placed or performed during the hospital encounter of 08/07/19 (from the past 48 hour(s))  Blood Culture (routine x 2)     Status: None (Preliminary result)   Collection Time: 08/07/19  6:00 PM   Specimen: BLOOD RIGHT FOREARM  Result Value Ref Range   Specimen Description BLOOD RIGHT FOREARM    Special Requests      BOTTLES DRAWN AEROBIC AND ANAEROBIC Blood Culture adequate volume Performed at [facility], [address] [place], [place] [address]    Culture PENDING    Report Status PENDING   Urinalysis, Routine w reflex microscopic     Status: Abnormal   Collection Time: 08/07/19  6:00 PM  Result Value Ref Range   Color, Urine YELLOW YELLOW   APPearance CLEAR CLEAR   Specific Gravity, Urine >1.046 (H) 1.005 - 1.030   pH  7.0 5.0 - 8.0   Glucose, UA NEGATIVE NEGATIVE mg/dL   Hgb urine dipstick MODERATE (A) NEGATIVE  Bilirubin Urine NEGATIVE NEGATIVE   Ketones, ur NEGATIVE NEGATIVE mg/dL   Protein, ur NEGATIVE NEGATIVE mg/dL   Nitrite NEGATIVE NEGATIVE   Leukocytes,Ua LARGE (A) NEGATIVE   RBC / HPF 11-20 0 - 5 RBC/hpf   WBC, UA 21-50 0 - 5 WBC/hpf   Bacteria, UA RARE (A) NONE SEEN   Squamous Epithelial / LPF 0-5 0 - 5   Non Squamous Epithelial 0-5 (A) NONE SEEN    Comment: Performed at [facility], [address], [place], [place] [address]  Lactic acid, plasma     Status: None   Collection Time: 08/07/19  6:01 PM  Result Value Ref Range   Lactic Acid, Venous 1.4 0.5 - 1.9 mmol/L    Comment: Performed at [facility], [address], [place], [place] [address]  SARS Coronavirus 2 by RT PCR (hospital order, performed in [place] hospital lab) Nasopharyngeal Nasopharyngeal Swab     Status: None   Collection Time: 08/07/19  6:02 PM   Specimen: Nasopharyngeal Swab  Result Value Ref Range   SARS Coronavirus 2 NEGATIVE NEGATIVE    Comment: (NOTE) SARS-CoV-2 target nucleic acids are NOT DETECTED.  The SARS-CoV-2 RNA is generally detectable in upper and lower respiratory specimens during the acute phase of infection. The lowest concentration of SARS-CoV-2 viral copies this assay can detect is 250 copies / mL. A negative result does not preclude SARS-CoV-2 infection and should not be used as the sole basis for treatment or other patient management decisions.  A negative result may occur with improper specimen collection / handling, submission of specimen other than nasopharyngeal swab, presence of viral mutation(s) within the areas targeted by this assay, and inadequate number of viral copies (<250 copies / mL). A negative result must be combined with clinical observations, patient history, and epidemiological information.  Fact Sheet for Patients:   StrictlyIdeas.no  Fact  Sheet for Healthcare Providers: BankingDealers.co.za  This test is not yet approved or  cleared by the [place] FDA and has been authorized for detection and/or diagnosis of SARS-CoV-2 by FDA under an Emergency Use Authorization (EUA).  This EUA will remain in effect (meaning this test can be used) for the duration of the COVID-19 declaration under Section 564(b)(1) of the Act, 21 U.S.C. section 360bbb-3(b)(1), unless the authorization is terminated or revoked sooner.  Performed at [facility], [address] [place], [place] [address]   Comprehensive metabolic panel     Status: Abnormal   Collection Time: 08/07/19  6:05 PM  Result Value Ref Range   Sodium 134 (L) 135 - 145 mmol/L   Potassium 3.7 3.5 - 5.1 mmol/L   Chloride 99 98 - 111 mmol/L   CO2 24 22 - 32 mmol/L   Glucose, Bld 102 (H) 70 - 99 mg/dL    Comment: Glucose reference range applies only to samples taken after fasting for at least 8 hours.   BUN 10 8 - 23 mg/dL   Creatinine, Ser 0.61 0.44 - 1.00 mg/dL   Calcium 8.4 (L) 8.9 - 10.3 mg/dL   Total Protein 7.7 6.5 - 8.1 g/dL   Albumin 4.0 3.5 - 5.0 g/dL   AST 37 15 - 41 U/L   ALT 27 0 - 44 U/L   Alkaline Phosphatase 80 38 - 126 U/L   Total Bilirubin 0.4 0.3 - 1.2 mg/dL   GFR calc non Af Amer >60 >60 mL/min   GFR calc Af Amer >60 >60 mL/min   Anion gap 11 5 -  15    Comment: Performed at [facility], [address] [place], [place] [address]  CBC WITH DIFFERENTIAL     Status: None   Collection Time: 08/07/19  6:05 PM  Result Value Ref Range   WBC 7.4 4.0 - 10.5 K/uL   RBC 4.07 3.87 - 5.11 MIL/uL   Hemoglobin 12.4 12.0 - 15.0 g/dL   HCT 38.6 36 - 46 %   MCV 94.8 80.0 - 100.0 fL   MCH 30.5 26.0 - 34.0 pg   MCHC 32.1 30.0 - 36.0 g/dL   RDW 12.7 11.5 - 15.5 %   Platelets 206 150 - 400 K/uL   nRBC 0.0 0.0 - 0.2 %   Neutrophils Relative % 66 %   Neutro Abs 4.9 1.7 - 7.7 K/uL   Lymphocytes Relative 21 %   Lymphs Abs 1.6 0.7 - 4.0 K/uL    Monocytes Relative 12 %   Monocytes Absolute 0.9 0 - 1 K/uL   Eosinophils Relative 0 %   Eosinophils Absolute 0.0 0 - 0 K/uL   Basophils Relative 1 %   Basophils Absolute 0.0 0 - 0 K/uL   Immature Granulocytes 0 %   Abs Immature Granulocytes 0.01 0.00 - 0.07 K/uL    Comment: Performed at [facility], [address], [place], [place] [address]  Protime-INR     Status: None   Collection Time: 08/07/19  6:05 PM  Result Value Ref Range   Prothrombin Time 13.5 11.4 - 15.2 seconds   INR 1.1 0.8 - 1.2    Comment: (NOTE) INR goal varies based on device and disease states. Performed at [facility], [address] [place], [place] [address]   Blood Culture (routine x 2)     Status: None (Preliminary result)   Collection Time: 08/07/19  6:58 PM   Specimen: BLOOD LEFT FOREARM  Result Value Ref Range   Specimen Description BLOOD LEFT FOREARM    Special Requests      BOTTLES DRAWN AEROBIC AND ANAEROBIC Blood Culture adequate volume Performed at [facility], [address], [place], [place] [address]    Culture PENDING    Report Status PENDING     Chemistries  Recent Labs  Lab 08/07/19 1805  NA 134*  K 3.7  CL 99  CO2 24  GLUCOSE 102*  BUN 10  CREATININE 0.61  CALCIUM 8.4*  AST 37  ALT 27  ALKPHOS 80  BILITOT 0.4   ------------------------------------------------------------------------------------------------------------------  ------------------------------------------------------------------------------------------------------------------ GFR: CrCl cannot be calculated (Unknown ideal weight.). Liver Function Tests: Recent Labs  Lab 08/07/19 1805  AST 37  ALT 27  ALKPHOS 80  BILITOT 0.4  PROT 7.7  ALBUMIN 4.0   No results for input(s): LIPASE, AMYLASE in the last 168 hours. No results for input(s): AMMONIA in the last 168 hours. Coagulation Profile: Recent Labs  Lab 08/07/19 1805  INR 1.1     --------------------------------------------------------------------------------------------------------------- Urine analysis:    Component Value Date/Time   COLORURINE YELLOW 08/07/2019 1800   APPEARANCEUR CLEAR 08/07/2019 1800   LABSPEC >1.046 (H) 08/07/2019 1800   PHURINE 7.0 08/07/2019 1800   GLUCOSEU NEGATIVE 08/07/2019 1800   HGBUR MODERATE (A) 08/07/2019 1800   BILIRUBINUR NEGATIVE 08/07/2019 1800   KETONESUR NEGATIVE 08/07/2019 1800   PROTEINUR NEGATIVE 08/07/2019 1800   UROBILINOGEN 0.2 05/17/2013 2030   NITRITE NEGATIVE 08/07/2019 1800   LEUKOCYTESUR LARGE (A) 08/07/2019 1800      Imaging Results:    CT Angio Chest PE W and/or Wo Contrast  Result Date: 08/07/2019 CLINICAL DATA:  Cough, concern for pneumonia versus PE EXAM: CT ANGIOGRAPHY CHEST WITH CONTRAST TECHNIQUE: Multidetector CT imaging of the chest was performed using the standard protocol during bolus administration of intravenous contrast. Multiplanar CT image reconstructions and MIPs were obtained to evaluate the vascular anatomy. CONTRAST:  112mL OMNIPAQUE IOHEXOL 300 MG/ML  SOLN COMPARISON:  Chest radiograph 08/07/2019, CT 01/17/2007 FINDINGS: Cardiovascular: Satisfactory opacification the pulmonary arteries to the segmental level. No pulmonary artery filling defects are identified. Central pulmonary arteries are normal caliber. Normal heart size. No pericardial effusion. Coronary artery calcifications are present. Atherosclerotic plaque within the normal caliber aorta. No acute luminal abnormality or periaortic stranding. Normal 3 vessel branching of the aortic arch minimal plaque at the left subclavian artery origin. Left dominant vertebral arteries. Major venous structures are unremarkable. Mediastinum/Nodes: No mediastinal fluid or gas. Normal thyroid gland and thoracic inlet. No acute abnormality of the trachea or esophagus. No worrisome mediastinal, hilar or axillary adenopathy. Lungs/Pleura: Apical  predominant centrilobular and paraseptal emphysematous changes. Diffuse airways thickening and scattered secretions are noted as well. Some mild biapical pleuroparenchymal scarring is noted. Part solid nodule seen in the superior segment left lower lobe with few adjacent areas of some branching tree-in-bud nodularity nodule measures approximately 9 mm in size (6/64) additional bandlike areas of opacity are present in the lung bases favoring subsegmental atelectasis or scarring. More dependent atelectatic changes posteriorly. Some mild mosaic attenuation in the lungs could reflect atelectatic changes related to imaging during exhalation or some mild small airways disease. Upper Abdomen: No acute abnormalities present in the visualized portions of the upper abdomen. Musculoskeletal: Chronic superior endplate deformity I78 with approximately 10% maximal height loss unchanged from prior. Multilevel degenerative changes are present in the imaged portions of the spine. No acute or suspicious osseous lesions. No worrisome chest wall lesions. Multilevel degenerative changes are present in the imaged portions of the spine. Additional degenerative changes in the spine. Review of the MIP images confirms the above findings. IMPRESSION: 1. No evidence of acute pulmonary artery filling defects to suggest pulmonary embolism. 2. Part solid nodule in the superior segment left lower lobe with few adjacent areas of some branching tree-in-bud nodularity, favored to reflect an infectious/inflammatory process. Follow-up non-contrast CT recommended at 3-6 months to confirm persistence. If unchanged, and solid component remains <6 mm, annual CT is recommended until 5 years of stability has been established. If persistent these nodules should be considered highly suspicious if the solid component of the nodule is 6 mm or greater in size and enlarging. This recommendation follows the consensus statement: Guidelines for Management of  Incidental Pulmonary Nodules Detected on CT Images: From the Fleischner Society 2017; Radiology 2017; 284:228-243. 3. Airways thickening and scattered secretions could reflect acute and/or chronic bronchitic changes. 4. Basilar in subsegmental atelectatic features without other acute intrathoracic process. 5. Emphysema (ICD10-J43.9) 6. Chronic superior endplate deformity M76 with approximately 10% maximal height loss. 7. Aortic Atherosclerosis (ICD10-I70.0) Electronically Signed   By: Lovena Le M.D.   On: 08/07/2019 22:15   DG Chest Port 1 View  Result Date: 08/07/2019 CLINICAL DATA:  Cough. EXAM: PORTABLE CHEST 1 VIEW COMPARISON:  October 01, 2014 FINDINGS: Mild, diffuse chronic appearing increased lung markings are seen. Very mild atelectasis is noted within the left lung base. There is no evidence of a pleural effusion or pneumothorax. The heart size and mediastinal contours are within normal limits. The visualized skeletal structures are unremarkable. IMPRESSION: Chronic appearing increased lung markings with very  mild left basilar atelectasis. Electronically Signed   By: Virgina Norfolk M.D.   On: 08/07/2019 18:27    My personal review of EKG: Rhythm NSR, no ST-T changes   Assessment & Plan:    Active Problems:   CAP (community acquired pneumonia)   1. Acute bronchitis versus CAP-patient presenting with symptoms of cough, fever, CT chest shows bronchitic changes.  Patient empirically started on ceftriaxone and Zithromax for community-acquired pneumonia.  We will start prednisone 40 mg daily, DuoNeb every 6 hours, Mucinex 1 tablet p.o. twice daily.  Patient is currently on 2 L/min of oxygen, will wean off as tolerated. 2. Abnormal UA-patient has abnormal UA, no symptoms of dysuria.  Urine culture has been obtained in the ED.  Follow urine culture results.  Patient already has been started on ceftriaxone as above. 3. Lung nodule-sore noted noted on the CT chest in the superior segment of left  lower lobe.  Recommended repeat CT chest in 3 to 6 months to follow-up on the nodule.  If size remains less than 6 mm then CT can be repeated in 5 years if size more than 6 mm then high probability of malignancy.  Patient to follow-up with PCP. 4. Hypothyroidism-continue Synthroid 5. Hyperlipidemia-continue Crestor 6. GERD-continue Protonix 40 mg daily   DVT Prophylaxis-   Lovenox   AM Labs Ordered, also please review Full Orders  Family Communication: Admission, patients condition and plan of care including tests being ordered have been discussed with the patient  who indicate understanding and agree with the plan and Code Status.  Code Status: Full code  Admission status: Observation  Time spent in minutes : 60 minutes   Ferman Basilio S Lilybeth Vien M.D

## 2019-08-08 NOTE — Telephone Encounter (Signed)
Nurses Patient is in the hospital currently CT scan shows nodule that needs a follow-up in 3 months. Please put this as a follow-up CT scan of the chest in 3 months into the reminder file due to pulmonary nodule Very important

## 2019-08-09 NOTE — Progress Notes (Signed)
Pt asymptomatic

## 2019-08-10 ENCOUNTER — Telehealth: Payer: Self-pay | Admitting: Family Medicine

## 2019-08-10 NOTE — Telephone Encounter (Signed)
Nurses Nurse's-patient recently discharged from the hospital. Please call patient, let them know that we are aware that they were discharged from the hospital. Please schedule them to follow-up with [place] within the next 7 days. Advised the patient to bring all of their medications with him to the visit. Please inquire if they are having any acute issues currently and documented accordingly.  Patient was in the hospital for pneumonia Please set her up to follow-up at the end of this week.  Sooner if she needs a sooner This may be a in office visit

## 2019-08-11 NOTE — Telephone Encounter (Signed)
Spoke to pt and let her know Dr. Bary Leriche recommendation and she scheduled appointment for Friday.

## 2019-08-12 LAB — CULTURE, BLOOD (ROUTINE X 2)
Culture: NO GROWTH
Culture: NO GROWTH
Special Requests: ADEQUATE
Special Requests: ADEQUATE

## 2019-08-13 ENCOUNTER — Other Ambulatory Visit: Payer: Self-pay | Admitting: *Deleted

## 2019-08-13 MED ORDER — AMOXICILLIN 500 MG PO CAPS
500.0000 mg | ORAL_CAPSULE | Freq: Three times a day (TID) | ORAL | 0 refills | Status: DC
Start: 2019-08-13 — End: 2019-08-15

## 2019-08-14 LAB — URINE CULTURE: Culture: 1000 — AB

## 2019-08-15 ENCOUNTER — Encounter: Payer: Self-pay | Admitting: Family Medicine

## 2019-08-15 ENCOUNTER — Other Ambulatory Visit: Payer: Self-pay

## 2019-08-15 ENCOUNTER — Ambulatory Visit (INDEPENDENT_AMBULATORY_CARE_PROVIDER_SITE_OTHER): Payer: PPO | Admitting: Family Medicine

## 2019-08-15 VITALS — BP 118/74 | HR 69 | Temp 98.4°F | Ht 61.0 in | Wt 132.8 lb

## 2019-08-15 DIAGNOSIS — R531 Weakness: Secondary | ICD-10-CM | POA: Diagnosis not present

## 2019-08-15 DIAGNOSIS — R911 Solitary pulmonary nodule: Secondary | ICD-10-CM | POA: Diagnosis not present

## 2019-08-15 DIAGNOSIS — N3000 Acute cystitis without hematuria: Secondary | ICD-10-CM | POA: Diagnosis not present

## 2019-08-15 NOTE — Progress Notes (Signed)
   Subjective:    Patient ID: Jordan Grant, female    DOB: 1940/06/08, 79 y.o.   MRN: [id number]  HPIfollow up hospitalization. Feels better but still tired. Not sleeping well due to cough and no appetite.   Patient here for follow-up.  Still having significant fatigue tiredness feeling rundown.  She denies any high fever chills nausea vomiting or diarrhea.  She was in the hospital for observation and she went home the following day We reviewed over her chest x-ray and her lab work as well as her teeth CT scan with pulmonary nodule We have her on the schedule currently to have abdominal or lung rescanned at the end of September.  Patient currently being treated for UTI but she should get better with that  Review of Systems    Please see above Objective:   Physical Exam  Lungs clear respiratory rate normal heart regular Extremities no edema skin warm dry blood pressure good      Assessment & Plan:  Very important for the patient to stay physically active exercise only as she is up to it  She is still recovering from a recent respiratory illness as well as UTI.  It may take her a couple weeks to get her energy back.  Very important for the patient do CT scan late September early October Patient to follow-up later in the fall If she has any setbacks she is to let us know.

## 2019-08-25 ENCOUNTER — Inpatient Hospital Stay: Payer: PPO | Admitting: Family Medicine

## 2019-08-29 ENCOUNTER — Telehealth: Payer: PPO | Admitting: Family Medicine

## 2019-10-10 ENCOUNTER — Telehealth: Payer: Self-pay | Admitting: Family Medicine

## 2019-10-10 MED ORDER — OMEPRAZOLE 40 MG PO CPDR: DELAYED_RELEASE_CAPSULE | ORAL | 1 refills | Status: DC

## 2019-10-10 MED ORDER — LEVOTHYROXINE SODIUM 75 MCG PO TABS: ORAL_TABLET | ORAL | 1 refills | Status: DC

## 2019-10-10 NOTE — Telephone Encounter (Signed)
Patent is needing refills on levothyroxine 75 mg and omeprazole 40 mg called into [facility]. She states completely out

## 2019-10-29 DIAGNOSIS — Z961 Presence of intraocular lens: Secondary | ICD-10-CM | POA: Diagnosis not present

## 2019-10-29 DIAGNOSIS — H524 Presbyopia: Secondary | ICD-10-CM | POA: Diagnosis not present

## 2019-10-29 DIAGNOSIS — H52203 Unspecified astigmatism, bilateral: Secondary | ICD-10-CM | POA: Diagnosis not present

## 2019-11-06 ENCOUNTER — Other Ambulatory Visit: Payer: Self-pay | Admitting: *Deleted

## 2019-11-06 DIAGNOSIS — R911 Solitary pulmonary nodule: Secondary | ICD-10-CM

## 2019-11-11 ENCOUNTER — Encounter: Payer: Self-pay | Admitting: Family Medicine

## 2019-11-13 ENCOUNTER — Other Ambulatory Visit: Payer: PPO

## 2019-11-13 DIAGNOSIS — Z20822 Contact with and (suspected) exposure to covid-19: Secondary | ICD-10-CM | POA: Diagnosis not present

## 2019-11-14 LAB — NOVEL CORONAVIRUS, NAA: SARS-CoV-2, NAA: NOT DETECTED

## 2019-11-14 LAB — SARS-COV-2, NAA 2 DAY TAT

## 2019-11-29 ENCOUNTER — Encounter: Payer: Self-pay | Admitting: Emergency Medicine

## 2019-11-29 ENCOUNTER — Other Ambulatory Visit: Payer: Self-pay

## 2019-11-29 ENCOUNTER — Ambulatory Visit
Admission: EM | Admit: 2019-11-29 | Discharge: 2019-11-29 | Disposition: A | Discharge status: Home / Self Care | Payer: PPO | Attending: Emergency Medicine | Admitting: Emergency Medicine

## 2019-11-29 DIAGNOSIS — R509 Fever, unspecified: Secondary | ICD-10-CM | POA: Diagnosis not present

## 2019-11-29 DIAGNOSIS — Z20822 Contact with and (suspected) exposure to covid-19: Secondary | ICD-10-CM

## 2019-11-29 NOTE — Discharge Instructions (Signed)
COVID testing ordered.  It will take between 5-7 days for test results.  Someone will contact you regarding abnormal results.    In the meantime: You should remain isolated in your home for 10 days from symptom onset AND greater than 72 hours after symptoms resolution (absence of fever without the use of fever-reducing medication and improvement in respiratory symptoms), whichever is longer Get plenty of rest and push fluids Use OTC medications like ibuprofen or tylenol as needed fever or pain Call or go to the ED if you have any new or worsening symptoms such as fever, cough, shortness of breath, chest tightness, chest pain, turning blue, changes in mental status, etc..Marland Kitchen

## 2019-11-29 NOTE — ED Provider Notes (Signed)
[facility]   [id number] 11/29/19 Arrival Time: 2951   CC: Fever  SUBJECTIVE: History from: patient.  Jordan Grant is a 79 y.o. female who presents with fever of 101 today.  Denies recent sick exposure to COVID, flu or strep.  Denies alleviating or aggravating factors.  Denies previous COVID infection in the past.   Received the covid vaccine.  Denies chills, fatigue, sinus pain, rhinorrhea, sore throat, SOB, wheezing, chest pain, nausea, changes in bowel or bladder habits.   ROS: As per HPI.  All other pertinent ROS negative.     Past Medical History:  Diagnosis Date   Anxiety    Arthritis    hip   Cataract    hx of   Diverticulosis 2007   History of diverticula   GERD (gastroesophageal reflux disease)    Hyperlipidemia    Hypothyroidism    Prediabetes    Past Surgical History:  Procedure Laterality Date   APPENDECTOMY     CATARACT EXTRACTION W/ INTRAOCULAR LENS  IMPLANT, BILATERAL  12/09   Dr Corrin Parker bilateral   COLONOSCOPY     DOPPLER ECHOCARDIOGRAPHY  06/2008   Leg   HEMORROIDECTOMY     ML bone density  07/11   TUBAL LIGATION     YAG LASER APPLICATION Right 8/84/1660   Procedure: YAG LASER APPLICATION;  Surgeon: Elta Guadeloupe T. Gershon Crane, MD;  Location: [facility];  Service: Ophthalmology;  Laterality: Right;   YAG LASER APPLICATION Left 08/13/1599   Procedure: YAG LASER APPLICATION;  Surgeon: Elta Guadeloupe T. Gershon Crane, MD;  Location: [facility];  Service: Ophthalmology;  Laterality: Left;   Allergies  Allergen Reactions   Codeine     Nausea, dizziness   Darvocet [Propoxyphene N-Acetaminophen]     nausea   Pravachol [Pravastatin Sodium]     Joint pain   No current facility-administered medications on file prior to encounter.   Current Outpatient Medications on File Prior to Encounter  Medication Sig Dispense Refill   albuterol (VENTOLIN HFA) 108 (90 Base) MCG/ACT inhaler Inhale 2 puffs into the lungs every 4 (four) hours as needed for wheezing or  shortness of breath (cough, shortness of breath or wheezing.). (Patient not taking: Reported on 08/15/2019) 8 g 1   amoxicillin (AMOXIL) 500 MG capsule Take 500 mg by mouth 3 (three) times daily.     dextromethorphan 15 MG/5ML syrup Take 5 mLs (15 mg total) by mouth 4 (four) times daily as needed for cough. 120 mL 0   Lactase (DAIRY DIGESTIVE PO) Take by mouth.     levothyroxine (SYNTHROID) 75 MCG tablet 1 qd 90 tablet 1   omeprazole (PRILOSEC) 40 MG capsule Take one capsule po qd 90 capsule 1   Polyethyl Glycol-Propyl Glycol (SYSTANE OP) Apply 1 drop to eye daily.      rosuvastatin (CRESTOR) 5 MG tablet Take 1 tablet (5 mg total) by mouth at bedtime. 90 tablet 1   traMADol (ULTRAM) 50 MG tablet TAKE 1 TABLET BY MOUTH TWICE DAILY AS NEEDED 60 tablet 5   Social History   Socioeconomic History   Marital status: Married    Spouse name: Not on file   Number of children: Not on file   Years of education: Not on file   Highest education level: Not on file  Occupational History   Not on file  Tobacco Use   Smoking status: Former Smoker    Types: Cigarettes    Quit date: 04/24/1983    Years since quitting: 36.6   Smokeless  tobacco: Never Used  Substance and Sexual Activity   Alcohol use: Yes    Alcohol/week: 11.0 standard drinks    Types: 4 Glasses of wine, 7 Standard drinks or equivalent per week   Drug use: No   Sexual activity: Not Currently    Comment: married, same sexual partner, but not active  Other Topics Concern   Not on file  Social History Narrative   Not on file   Social Determinants of Health   Financial Resource Strain:    Difficulty of Paying Living Expenses: Not on file  Food Insecurity:    Worried About Charity fundraiser in the Last Year: Not on file   [employer] of Food in the Last Year: Not on file  Transportation Needs:    Lack of Transportation (Medical): Not on file   Lack of Transportation (Non-Medical): Not on file  Physical  Activity:    Days of Exercise per Week: Not on file   Minutes of Exercise per Session: Not on file  Stress:    Feeling of Stress : Not on file  Social Connections:    Frequency of Communication with Friends and Family: Not on file   Frequency of Social Gatherings with Friends and Family: Not on file   Attends Religious Services: Not on file   Active Member of Clubs or Organizations: Not on file   Attends Archivist Meetings: Not on file   Marital Status: Not on file  Intimate Partner Violence:    Fear of Current or Ex-Partner: Not on file   Emotionally Abused: Not on file   Physically Abused: Not on file   Sexually Abused: Not on file   Family History  Problem Relation Age of Onset   Esophageal cancer Mother 24       died at 54   Stomach cancer Mother 42       removed 1/2 of stomach, distal esophageal ca   Breast cancer Maternal Aunt    Colon cancer Neg Hx    Rectal cancer Neg Hx     OBJECTIVE:  Vitals:   11/29/19 1459  BP: (!) 144/88  Pulse: 81  Resp: 16  Temp: 98.3 F (36.8 C)  TempSrc: Oral  SpO2: 94%    General appearance: alert; well-appearing, nontoxic; speaking in full sentences and tolerating own secretions HEENT: NCAT; Ears: EACs clear, TMs pearly gray; Eyes: PERRL.  EOM grossly intact.Nose: nares patent without rhinorrhea, Throat: oropharynx clear, tonsils non erythematous or enlarged, uvula midline  Neck: supple without LAD Lungs: unlabored respirations, symmetrical air entry; cough: absent; no respiratory distress; CTAB Heart: regular rate and rhythm.  Skin: warm and dry Psychological: alert and cooperative; normal mood and affect   ASSESSMENT & PLAN:  1. Fever, unspecified   2. Encounter for laboratory testing for COVID-19 virus    COVID testing ordered.  It will take between 5-7 days for test results.  Someone will contact you regarding abnormal results.    In the meantime: You should remain isolated in your home for  10 days from symptom onset AND greater than 72 hours after symptoms resolution (absence of fever without the use of fever-reducing medication and improvement in respiratory symptoms), whichever is longer Get plenty of rest and push fluids Use OTC medications like ibuprofen or tylenol as needed fever or pain Call or go to the ED if you have any new or worsening symptoms such as fever, cough, shortness of breath, chest tightness, chest pain, turning blue, changes  in mental status, etc...   Reviewed expectations re: course of current medical issues. Questions answered. Outlined signs and symptoms indicating need for more acute intervention. Patient verbalized understanding. After Visit Summary given.         Lestine Box, PA-C 11/29/19 1503

## 2019-11-29 NOTE — ED Triage Notes (Signed)
Needs covid test. Had temp scanned at a business and it was 10. Denies any s/s

## 2019-11-30 LAB — NOVEL CORONAVIRUS, NAA: SARS-CoV-2, NAA: NOT DETECTED

## 2019-11-30 LAB — SARS-COV-2, NAA 2 DAY TAT

## 2019-12-05 ENCOUNTER — Ambulatory Visit (HOSPITAL_COMMUNITY): Admission: RE | Admit: 2019-12-05 | Payer: PPO | Source: Ambulatory Visit

## 2019-12-11 ENCOUNTER — Ambulatory Visit: Payer: PPO | Attending: Internal Medicine

## 2019-12-11 DIAGNOSIS — Z23 Encounter for immunization: Secondary | ICD-10-CM

## 2019-12-11 NOTE — Progress Notes (Signed)
   Covid-19 Vaccination Clinic  Name:  Jordan Grant    MRN: [id number] DOB: [birth year]  12/11/2019  Jordan Grant was observed post Covid-19 immunization for 15 minutes without incident. She was provided with Vaccine Information Sheet and instruction to access the V-Safe system.   Jordan Grant was instructed to call 911 with any severe reactions post vaccine: Marland Kitchen Difficulty breathing  . Swelling of face and throat  . A fast heartbeat  . A bad rash all over body  . Dizziness and weakness

## 2019-12-22 ENCOUNTER — Other Ambulatory Visit: Payer: Self-pay

## 2019-12-22 ENCOUNTER — Ambulatory Visit (HOSPITAL_COMMUNITY)
Admission: RE | Admit: 2019-12-22 | Discharge: 2019-12-22 | Disposition: A | Discharge status: Home / Self Care | Payer: PPO | Source: Ambulatory Visit | Attending: Family Medicine | Admitting: Family Medicine

## 2019-12-22 DIAGNOSIS — I2510 Atherosclerotic heart disease of native coronary artery without angina pectoris: Secondary | ICD-10-CM | POA: Diagnosis not present

## 2019-12-22 DIAGNOSIS — J9811 Atelectasis: Secondary | ICD-10-CM | POA: Diagnosis not present

## 2019-12-22 DIAGNOSIS — M47814 Spondylosis without myelopathy or radiculopathy, thoracic region: Secondary | ICD-10-CM | POA: Diagnosis not present

## 2019-12-22 DIAGNOSIS — J432 Centrilobular emphysema: Secondary | ICD-10-CM | POA: Diagnosis not present

## 2019-12-22 DIAGNOSIS — R911 Solitary pulmonary nodule: Secondary | ICD-10-CM | POA: Diagnosis not present

## 2019-12-24 ENCOUNTER — Other Ambulatory Visit: Payer: Self-pay

## 2019-12-24 ENCOUNTER — Telehealth: Payer: Self-pay | Admitting: Family Medicine

## 2019-12-24 ENCOUNTER — Telehealth (INDEPENDENT_AMBULATORY_CARE_PROVIDER_SITE_OTHER): Payer: PPO | Admitting: Family Medicine

## 2019-12-24 ENCOUNTER — Encounter: Payer: Self-pay | Admitting: Family Medicine

## 2019-12-24 DIAGNOSIS — J432 Centrilobular emphysema: Secondary | ICD-10-CM | POA: Diagnosis not present

## 2019-12-24 DIAGNOSIS — E785 Hyperlipidemia, unspecified: Secondary | ICD-10-CM

## 2019-12-24 DIAGNOSIS — I700 Atherosclerosis of aorta: Secondary | ICD-10-CM | POA: Diagnosis not present

## 2019-12-24 DIAGNOSIS — J439 Emphysema, unspecified: Secondary | ICD-10-CM

## 2019-12-24 HISTORY — DX: Emphysema, unspecified: J43.9

## 2019-12-24 NOTE — Progress Notes (Signed)
   Subjective:    Patient ID: Jordan Grant, female    DOB: 13-May-1940, 79 y.o.   MRN: [id number]  HPI Patient calling to discuss results of recent chest ct. 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 Virtual Visit via Video Note  I connected with Jordan Grant on 12/24/19 at  1:40 PM EST by a video enabled telemedicine application and verified that I am speaking with the correct person using two identifiers.  Location: Patient: At her son's house Provider: Office   I discussed the limitations of evaluation and management by telemedicine and the availability of in person appointments. The patient expressed understanding and agreed to proceed.  History of Present Illness:    Observations/Objective:   Assessment and Plan:   Follow Up Instructions:    I discussed the assessment and treatment plan with the patient. The patient was provided an opportunity to ask questions and all were answered. The patient agreed with the plan and demonstrated an understanding of the instructions.   The patient was advised to call back or seek an in-person evaluation if the symptoms worsen or if the condition fails to improve as anticipated.  I provided 20 minutes spent chart review, documentation, time spent with patient minutes of non-face-to-face time during this encounter.   Sallee Lange, MD    Review of Systems Patient denies any chest tightness pressure pain shortness breath dizziness    Objective:   Physical Exam  Today's visit was via telephone Physical exam was not possible for this visit       Assessment & Plan:  1.  Hyperlipidemia, unspecified hyperlipidemia type Very important to get lipid under better control we will check lipid profile within the next 6 to 8 weeks she will do a consistent job of taking her medicine the goal is to get LDL below 70  2. Aortic atherosclerosis (HCC) This was seen on the CAT scan importance of getting cholesterol under control and taking statin to lessen the risk of stroke  3. Centrilobular emphysema (HCC) Minimal amount was seen on CAT scan patient not having symptoms it is not recommended to be on any type of inhaler at this point time  Keep all regular standard follow-ups  Pulmonary nodule resolved

## 2019-12-24 NOTE — Telephone Encounter (Signed)
Please inform the patient of this on Monday or Tuesday She is currently out of town

## 2019-12-24 NOTE — Telephone Encounter (Signed)
Nurses Patient had Covid vaccine booster at the end of October.  The vaccination place told her to delay her mammogram for 6 to 8 weeks. That seems a little long  Please touch base with the breast center/mammography center with the hospital-find out how long they are recommending for the patient to wait after Covid vaccine Jordan Grant) then please inform the patient Monday or Tuesday, she is on vacation the next few days thank you

## 2019-12-24 NOTE — Telephone Encounter (Signed)
Radiology stated the mammogram must be at least 6 weeks after the covid vaccine

## 2019-12-25 ENCOUNTER — Other Ambulatory Visit: Payer: Self-pay | Admitting: *Deleted

## 2019-12-25 DIAGNOSIS — E785 Hyperlipidemia, unspecified: Secondary | ICD-10-CM

## 2019-12-29 NOTE — Telephone Encounter (Signed)
Lmtc

## 2019-12-29 NOTE — Telephone Encounter (Signed)
Pt.notified

## 2019-12-30 ENCOUNTER — Other Ambulatory Visit (HOSPITAL_COMMUNITY): Payer: Self-pay | Admitting: Family Medicine

## 2019-12-30 DIAGNOSIS — Z1231 Encounter for screening mammogram for malignant neoplasm of breast: Secondary | ICD-10-CM

## 2020-01-06 DIAGNOSIS — E785 Hyperlipidemia, unspecified: Secondary | ICD-10-CM | POA: Diagnosis not present

## 2020-01-07 ENCOUNTER — Other Ambulatory Visit: Payer: Self-pay | Admitting: Family Medicine

## 2020-01-07 DIAGNOSIS — E785 Hyperlipidemia, unspecified: Secondary | ICD-10-CM

## 2020-01-07 DIAGNOSIS — Z79899 Other long term (current) drug therapy: Secondary | ICD-10-CM

## 2020-01-07 LAB — LIPID PANEL
Chol/HDL Ratio: 2.7 ratio (ref 0.0–4.4)
Cholesterol, Total: 184 mg/dL (ref 100–199)
HDL: 69 mg/dL (ref >39)
LDL Chol Calc (NIH): 94 mg/dL (ref 0–99)
Triglycerides: 123 mg/dL (ref 0–149)
VLDL Cholesterol Cal: 21 mg/dL (ref 5–40)

## 2020-01-07 MED ORDER — ROSUVASTATIN CALCIUM 10 MG PO TABS: ORAL_TABLET | ORAL | 5 refills | Status: DC

## 2020-01-21 ENCOUNTER — Other Ambulatory Visit: Payer: PPO

## 2020-01-30 ENCOUNTER — Other Ambulatory Visit: Payer: Self-pay

## 2020-01-30 ENCOUNTER — Ambulatory Visit (HOSPITAL_COMMUNITY)
Admission: RE | Admit: 2020-01-30 | Discharge: 2020-01-30 | Disposition: A | Discharge status: Home / Self Care | Payer: PPO | Source: Ambulatory Visit | Attending: Family Medicine | Admitting: Family Medicine

## 2020-01-30 DIAGNOSIS — Z1231 Encounter for screening mammogram for malignant neoplasm of breast: Secondary | ICD-10-CM | POA: Diagnosis not present

## 2020-02-09 ENCOUNTER — Other Ambulatory Visit: Payer: Self-pay | Admitting: Family Medicine

## 2020-04-05 DIAGNOSIS — E785 Hyperlipidemia, unspecified: Secondary | ICD-10-CM | POA: Diagnosis not present

## 2020-04-05 DIAGNOSIS — Z79899 Other long term (current) drug therapy: Secondary | ICD-10-CM | POA: Diagnosis not present

## 2020-04-06 LAB — LIPID PANEL
Chol/HDL Ratio: 2.9 ratio (ref 0.0–4.4)
Cholesterol, Total: 176 mg/dL (ref 100–199)
HDL: 60 mg/dL (ref >39)
LDL Chol Calc (NIH): 95 mg/dL (ref 0–99)
Triglycerides: 119 mg/dL (ref 0–149)
VLDL Cholesterol Cal: 21 mg/dL (ref 5–40)

## 2020-04-06 LAB — HEPATIC FUNCTION PANEL
ALT: 23 IU/L (ref 0–32)
AST: 35 IU/L (ref 0–40)
Albumin: 4.8 g/dL — ABNORMAL HIGH (ref 3.6–4.6)
Alkaline Phosphatase: 95 IU/L (ref 44–121)
Bilirubin Total: 0.5 mg/dL (ref 0.0–1.2)
Bilirubin, Direct: 0.12 mg/dL (ref 0.00–0.40)
Total Protein: 7.8 g/dL (ref 6.0–8.5)

## 2020-04-08 ENCOUNTER — Encounter: Payer: Self-pay | Admitting: Family Medicine

## 2020-04-08 ENCOUNTER — Other Ambulatory Visit: Payer: Self-pay

## 2020-04-08 ENCOUNTER — Ambulatory Visit (INDEPENDENT_AMBULATORY_CARE_PROVIDER_SITE_OTHER): Payer: PPO | Admitting: Family Medicine

## 2020-04-08 VITALS — BP 122/72 | HR 100 | Temp 94.4°F | Wt 135.6 lb

## 2020-04-08 DIAGNOSIS — E038 Other specified hypothyroidism: Secondary | ICD-10-CM | POA: Diagnosis not present

## 2020-04-08 DIAGNOSIS — M7918 Myalgia, other site: Secondary | ICD-10-CM

## 2020-04-08 DIAGNOSIS — I700 Atherosclerosis of aorta: Secondary | ICD-10-CM

## 2020-04-08 DIAGNOSIS — E785 Hyperlipidemia, unspecified: Secondary | ICD-10-CM | POA: Diagnosis not present

## 2020-04-08 MED ORDER — LEVOTHYROXINE SODIUM 75 MCG PO TABS
ORAL_TABLET | ORAL | 1 refills | Status: DC
Start: 2020-04-08 — End: 2020-09-15

## 2020-04-08 MED ORDER — ROSUVASTATIN CALCIUM 20 MG PO TABS
ORAL_TABLET | ORAL | 5 refills | Status: DC
Start: 2020-04-08 — End: 2020-05-07

## 2020-04-08 NOTE — Progress Notes (Signed)
Subjective:    Patient ID: Jordan Grant, female    DOB: Jun 02, 1940, 80 y.o.   MRN: [id number]  HPI Pt here for med check. Pt taking Levothyroxine 75 mcg daily. Taking Crestor at night.   Patient does have a history of hypothyroidism takes her medication on a regular basis History hyperlipidemia she does state her legs ache a little bit in the morning but she is not sure if that is related to the medicine.  We did discuss how stopping the medicine for 2 weeks and if the pain goes away then restart the medicine and if the pain comes back then more than likely the pain is related to the medicine but she is not sure if the pain is related to the medicine and she will continue to take the medicine  She does states she takes 2 tramadol's a day but if she does not take 1 she finds her self feeling terrible she feels that she is become addicted to this.  We did discuss the difference between addiction and dependence.  I do not feel the patient is addicted but certainly could have a dependence at this point.  It would be reasonable for her to try to taper down on these if she is willing to do so.  See below.  She is concerned about her albumin being slightly elevated she read how that could be related to too much wine she does drink 2 glasses a day we did talk about cutting that back to just 1/day  Aortic atherosclerosis ([place])  Other specified hypothyroidism  Hyperlipidemia, unspecified hyperlipidemia type  Musculoskeletal pain   Results for orders placed or performed in visit on 01/07/20  Lipid Profile  Result Value Ref Range   Cholesterol, Total 176 100 - 199 mg/dL   Triglycerides 119 0 - 149 mg/dL   HDL 60 >39 mg/dL   VLDL Cholesterol Cal 21 5 - 40 mg/dL   LDL Chol Calc (NIH) 95 0 - 99 mg/dL   Chol/HDL Ratio 2.9 0.0 - 4.4 ratio  Hepatic function panel  Result Value Ref Range   Total Protein 7.8 6.0 - 8.5 g/dL   Albumin 4.8 (H) 3.6 - 4.6 g/dL   Bilirubin Total 0.5 0.0 - 1.2 mg/dL    Bilirubin, Direct 0.12 0.00 - 0.40 mg/dL   Alkaline Phosphatase 95 44 - 121 IU/L   AST 35 0 - 40 IU/L   ALT 23 0 - 32 IU/L     Review of Systems  Constitutional: Negative for activity change and appetite change.  HENT: Negative for congestion and rhinorrhea.   Respiratory: Negative for cough and shortness of breath.   Cardiovascular: Negative for chest pain and leg swelling.  Gastrointestinal: Negative for abdominal pain, nausea and vomiting.  Skin: Negative for color change.  Neurological: Negative for dizziness and weakness.  Psychiatric/Behavioral: Negative for agitation and confusion.       Objective:    Lungs are clear heart regular pulse normal extremities no edema      Assessment & Plan:  1. Aortic atherosclerosis ([place]) This was seen on a CAT scan previously.  It is to her benefit to continue the medication.  In order to get LDL below 70 increase Crestor.  If that causes musculoskeletal pain we may have to go back to the 10 mg dose.  2. Other specified hypothyroidism Continue thyroid medication as planned  3. Hyperlipidemia, unspecified hyperlipidemia type Continue statin at 20 mg we will check lipid liver profile later  towards summer  4. Musculoskeletal pain Encourage patient to taper down on tramadol she is currently doing 50 mg twice a day.  I recommend a half a tablet taken 3 times daily and therefore this should help get her down to a better level.  Recheck patient in 4 weeks if she is doing well with this reduced dose we will see if she is interested in going down to half tablet twice daily and continue to do so until the patient is off or at the lowest amount of medicine that she can tolerate regarding tapering down

## 2020-04-08 NOTE — Patient Instructions (Addendum)
Tramadol  Go to 1/2 in the morning  1/2 mid day  1/2 near evening  Crestor 20 once daily  Repeat labs in 3 to 4 months   Recheck here in 3 to 4 weeks

## 2020-05-07 ENCOUNTER — Other Ambulatory Visit: Payer: Self-pay

## 2020-05-07 ENCOUNTER — Telehealth (INDEPENDENT_AMBULATORY_CARE_PROVIDER_SITE_OTHER): Payer: PPO | Admitting: Family Medicine

## 2020-05-07 DIAGNOSIS — M7918 Myalgia, other site: Secondary | ICD-10-CM

## 2020-05-07 DIAGNOSIS — M791 Myalgia, unspecified site: Secondary | ICD-10-CM | POA: Insufficient documentation

## 2020-05-07 DIAGNOSIS — T466X5A Adverse effect of antihyperlipidemic and antiarteriosclerotic drugs, initial encounter: Secondary | ICD-10-CM | POA: Insufficient documentation

## 2020-05-07 MED ORDER — ROSUVASTATIN CALCIUM 10 MG PO TABS
ORAL_TABLET | ORAL | 5 refills | Status: DC
Start: 2020-05-07 — End: 2020-09-15

## 2020-05-07 MED ORDER — TRAMADOL HCL 50 MG PO TABS: ORAL_TABLET | ORAL | 5 refills | Status: DC

## 2020-05-07 NOTE — Progress Notes (Signed)
   Subjective:    Patient ID: Jordan Grant, female    DOB: 1940-08-20, 80 y.o.   MRN: [id number]  HPIfollow up on reduced dose of tramadol. Pt states she does ok with one half in the morning and one half at lunch but has to take a whole one at night.  Patient tried to reduce the medication but unfortunately her body will not let her do so.  She does not abuse the medicine.  She only takes 2/day.  This is within safe prescribing.  We did discuss how she wants to try to taper down again in the future she could. Pt states she cut her crestor back from 20mg  to 10mg  due to muscle aches.  She did have myalgias on the higher dose.  Virtual Visit via Telephone Note  I connected with Jordan Grant on 05/07/20 at 11:00 AM EDT by telephone and verified that I am speaking with the correct person using two identifiers.  Location: Patient: home  Provider: office   I discussed the limitations, risks, security and privacy concerns of performing an evaluation and management service by telephone and the availability of in person appointments. I also discussed with the patient that there may be a patient responsible charge related to this service. The patient expressed understanding and agreed to proceed.   History of Present Illness:    Observations/Objective:   Assessment and Plan:   Follow Up Instructions:    I discussed the assessment and treatment plan with the patient. The patient was provided an opportunity to ask questions and all were answered. The patient agreed with the plan and demonstrated an understanding of the instructions.   The patient was advised to call back or seek an in-person evaluation if the symptoms worsen or if the condition fails to improve as anticipated.  I provided 15 minutes of non-face-to-face time during this encounter.      Review of Systems     Objective:   Physical Exam  Today's visit was via telephone Physical exam was not possible for this  visit       Assessment & Plan:  1. Myalgia due to statin She tolerates Crestor 10 mg we will continue this does not tolerate 20 follow-up in the fall lab work at that time  2. Musculoskeletal pain Unable to taper down on tramadol refills were sent in not to exceed 2/day

## 2020-09-15 ENCOUNTER — Telehealth: Payer: Self-pay | Admitting: Family Medicine

## 2020-09-15 ENCOUNTER — Other Ambulatory Visit: Payer: Self-pay

## 2020-09-15 ENCOUNTER — Telehealth (INDEPENDENT_AMBULATORY_CARE_PROVIDER_SITE_OTHER): Payer: PPO | Admitting: Family Medicine

## 2020-09-15 DIAGNOSIS — E785 Hyperlipidemia, unspecified: Secondary | ICD-10-CM | POA: Diagnosis not present

## 2020-09-15 DIAGNOSIS — E038 Other specified hypothyroidism: Secondary | ICD-10-CM

## 2020-09-15 MED ORDER — OMEPRAZOLE 40 MG PO CPDR
DELAYED_RELEASE_CAPSULE | ORAL | 1 refills | Status: DC
Start: 2020-09-15 — End: 2021-03-09

## 2020-09-15 MED ORDER — LEVOTHYROXINE SODIUM 75 MCG PO TABS
ORAL_TABLET | ORAL | 1 refills | Status: DC
Start: 2020-09-15 — End: 2021-03-31

## 2020-09-15 MED ORDER — ROSUVASTATIN CALCIUM 10 MG PO TABS: ORAL_TABLET | ORAL | 1 refills | Status: DC

## 2020-09-15 NOTE — Telephone Encounter (Signed)
Ms. sussie, diogo are scheduled for a virtual visit with your provider today.    Just as we do with appointments in the office, we must obtain your consent to participate.  Your consent will be active for this visit and any virtual visit you may have with one of our providers in the next 365 days.    If you have a MyChart account, I can also send a copy of this consent to you electronically.  All virtual visits are billed to your insurance company just like a traditional visit in the office.  As this is a virtual visit, video technology does not allow for your provider to perform a traditional examination.  This may limit your provider's ability to fully assess your condition.  If your provider identifies any concerns that need to be evaluated in person or the need to arrange testing such as labs, EKG, etc, we will make arrangements to do so.    Although advances in technology are sophisticated, we cannot ensure that it will always work on either your end or our end.  If the connection with a video visit is poor, we may have to switch to a telephone visit.  With either a video or telephone visit, we are not always able to ensure that we have a secure connection.   I need to obtain your verbal consent now.   Are you willing to proceed with your visit today?   ANNAKAY JASMIN has provided verbal consent on 09/15/2020 for a virtual visit (video or telephone).   Vicente Males, LPN [phone]  [id number] AM

## 2020-09-15 NOTE — Progress Notes (Signed)
A Subjective:    Patient ID: Jordan Grant, female    DOB: May 03, 1940, 80 y.o.   MRN: [id number]  HPI Video failed telephone only Pt wanting to speak with provider regarding whether to increase Crestor 10 mg to a higher dose. Pt is currently taking 10 mg Rosuvastatin.   Virtual Visit via Telephone Note  I connected with Jordan Grant on 09/15/20 at  8:20 AM EDT by telephone and verified that I am speaking with the correct person using two identifiers.  Location: Patient: home Provider: office   I discussed the limitations, risks, security and privacy concerns of performing an evaluation and management service by telephone and the availability of in person appointments. I also discussed with the patient that there may be a patient responsible charge related to this service. The patient expressed understanding and agreed to proceed.   History of Present Illness:    Observations/Objective:   Assessment and Plan:   Follow Up Instructions:    I discussed the assessment and treatment plan with the patient. The patient was provided an opportunity to ask questions and all were answered. The patient agreed with the plan and demonstrated an understanding of the instructions.   The patient was advised to call back or seek an in-person evaluation if the symptoms worsen or if the condition fails to improve as anticipated.  I provided  15 minutes of non-face-to-face time during this encounter.   Vicente Males, LPN    Review of Systems     Objective:   Physical Exam   Today's visit was via telephone Physical exam was not possible for this visit      Assessment & Plan:  Hyperlipidemia-she is tolerating the Crestor 10 mg daily I recommend she continue this.  It would not be wise to go up on the dose currently.  She will do lab work before her next follow-up visit.  Hypothyroidism doing well with the medicine continue current measures check labs in the fall  Reflux  stable with current medicine refills will be sent in dietary measures discussed  Tramadol taken twice daily as needed to help with her discomfort  Patient does have hip pain she states she would like to discuss this further on follow-up visit next visit late September or early October lab work before that visit

## 2020-09-16 ENCOUNTER — Other Ambulatory Visit: Payer: Self-pay | Admitting: *Deleted

## 2020-09-16 DIAGNOSIS — E038 Other specified hypothyroidism: Secondary | ICD-10-CM

## 2020-09-16 DIAGNOSIS — Z79899 Other long term (current) drug therapy: Secondary | ICD-10-CM

## 2020-09-16 DIAGNOSIS — E785 Hyperlipidemia, unspecified: Secondary | ICD-10-CM

## 2020-10-28 ENCOUNTER — Telehealth: Payer: Self-pay | Admitting: Family Medicine

## 2020-10-28 DIAGNOSIS — E038 Other specified hypothyroidism: Secondary | ICD-10-CM | POA: Diagnosis not present

## 2020-10-28 DIAGNOSIS — Z79899 Other long term (current) drug therapy: Secondary | ICD-10-CM | POA: Diagnosis not present

## 2020-10-28 DIAGNOSIS — E785 Hyperlipidemia, unspecified: Secondary | ICD-10-CM | POA: Diagnosis not present

## 2020-10-28 DIAGNOSIS — R7303 Prediabetes: Secondary | ICD-10-CM

## 2020-10-28 NOTE — Telephone Encounter (Signed)
A1C ordered per provider. Will take lab order over to [facility]

## 2020-10-28 NOTE — Telephone Encounter (Signed)
Patient stopped by requesting A1C lab added to lab draw from this morning  CB#[phone]

## 2020-10-29 LAB — BASIC METABOLIC PANEL
BUN/Creatinine Ratio: 16 (ref 12–28)
BUN: 11 mg/dL (ref 8–27)
CO2: 24 mmol/L (ref 20–29)
Calcium: 9.3 mg/dL (ref 8.7–10.3)
Chloride: 104 mmol/L (ref 96–106)
Creatinine, Ser: 0.7 mg/dL (ref 0.57–1.00)
Glucose: 100 mg/dL — ABNORMAL HIGH (ref 65–99)
Potassium: 4.3 mmol/L (ref 3.5–5.2)
Sodium: 140 mmol/L (ref 134–144)
eGFR: 87 mL/min/{1.73_m2} (ref >59)

## 2020-10-29 LAB — HEPATIC FUNCTION PANEL
ALT: 16 IU/L (ref 0–32)
AST: 24 IU/L (ref 0–40)
Albumin: 4.2 g/dL (ref 3.6–4.6)
Alkaline Phosphatase: 83 IU/L (ref 44–121)
Bilirubin Total: 0.5 mg/dL (ref 0.0–1.2)
Bilirubin, Direct: 0.14 mg/dL (ref 0.00–0.40)
Total Protein: 6.7 g/dL (ref 6.0–8.5)

## 2020-10-29 LAB — LIPID PANEL
Chol/HDL Ratio: 2.6 ratio (ref 0.0–4.4)
Cholesterol, Total: 151 mg/dL (ref 100–199)
HDL: 58 mg/dL (ref >39)
LDL Chol Calc (NIH): 73 mg/dL (ref 0–99)
Triglycerides: 110 mg/dL (ref 0–149)
VLDL Cholesterol Cal: 20 mg/dL (ref 5–40)

## 2020-10-29 LAB — HEMOGLOBIN A1C
Est. average glucose Bld gHb Est-mCnc: 137 mg/dL
Hgb A1c MFr Bld: 6.4 % — ABNORMAL HIGH (ref 4.8–5.6)

## 2020-10-29 LAB — TSH: TSH: 0.586 u[IU]/mL (ref 0.450–4.500)

## 2020-11-01 DIAGNOSIS — H524 Presbyopia: Secondary | ICD-10-CM | POA: Diagnosis not present

## 2020-11-01 DIAGNOSIS — H52203 Unspecified astigmatism, bilateral: Secondary | ICD-10-CM | POA: Diagnosis not present

## 2020-11-01 DIAGNOSIS — Z961 Presence of intraocular lens: Secondary | ICD-10-CM | POA: Diagnosis not present

## 2020-11-02 ENCOUNTER — Ambulatory Visit (INDEPENDENT_AMBULATORY_CARE_PROVIDER_SITE_OTHER): Payer: PPO

## 2020-11-02 ENCOUNTER — Other Ambulatory Visit: Payer: Self-pay

## 2020-11-02 VITALS — BP 118/64 | HR 85 | Temp 98.1°F | Ht 61.0 in | Wt 135.0 lb

## 2020-11-02 DIAGNOSIS — Z0000 Encounter for general adult medical examination without abnormal findings: Secondary | ICD-10-CM

## 2020-11-02 NOTE — Patient Instructions (Signed)
Jordan Grant , Thank you for taking time to come for your Medicare Wellness Visit. I appreciate your ongoing commitment to your health goals. Please review the following plan we discussed and let me know if I can assist you in the future.   Screening recommendations/referrals: Colonoscopy: No longer required due to age.  Mammogram: Done 01/30/2020, Repeat annually  Bone Density: Done 07/31/2019 Repeat every 2 years  Recommended yearly ophthalmology/optometry visit for glaucoma screening and checkup Recommended yearly dental visit for hygiene and checkup  Vaccinations: Influenza vaccine: Done 11/02/2020 Pneumococcal vaccine: Done 01/14/2011 09/19/2013 Tdap vaccine: Repeat in 10 years  Shingles vaccine: Done 03/16/2010 01/23/2019 05/26/2019   Covid-19:Done 02/25/2019, 03/28/2019, 12/11/2019  Advanced directives: Advance directive discussed with you today. Even though you declined this today, please call our office should you change your mind, and we can give you the proper paperwork for you to fill out.   Conditions/risks identified: Aim for 30 minutes of exercise or walking each day, drink 6-8 glasses of water and eat lots of fruits and vegetables.   Next appointment: Follow up in one year for your annual wellness visit 11/08/21 @ 2:20 pm.   Preventive Care 65 Years and Older, Female Preventive care refers to lifestyle choices and visits with your health care provider that can promote health and wellness. What does preventive care include? A yearly physical exam. This is also called an annual well check. Dental exams once or twice a year. Routine eye exams. Ask your health care provider how often you should have your eyes checked. Personal lifestyle choices, including: Daily care of your teeth and gums. Regular physical activity. Eating a healthy diet. Avoiding tobacco and drug use. Limiting alcohol use. Practicing safe sex. Taking low-dose aspirin every day. Taking vitamin and mineral  supplements as recommended by your health care provider. What happens during an annual well check? The services and screenings done by your health care provider during your annual well check will depend on your age, overall health, lifestyle risk factors, and family history of disease. Counseling  Your health care provider may ask you questions about your: Alcohol use. Tobacco use. Drug use. Emotional well-being. Home and relationship well-being. Sexual activity. Eating habits. History of falls. Memory and ability to understand (cognition). Work and work Statistician. Reproductive health. Screening  You may have the following tests or measurements: Height, weight, and BMI. Blood pressure. Lipid and cholesterol levels. These may be checked every 5 years, or more frequently if you are over 46 years old. Skin check. Lung cancer screening. You may have this screening every year starting at age 6 if you have a 30-pack-year history of smoking and currently smoke or have quit within the past 15 years. Fecal occult blood test (FOBT) of the stool. You may have this test every year starting at age 20. Flexible sigmoidoscopy or colonoscopy. You may have a sigmoidoscopy every 5 years or a colonoscopy every 10 years starting at age 72. Hepatitis C blood test. Hepatitis B blood test. Sexually transmitted disease (STD) testing. Diabetes screening. This is done by checking your blood sugar (glucose) after you have not eaten for a while (fasting). You may have this done every 1-3 years. Bone density scan. This is done to screen for osteoporosis. You may have this done starting at age 1. Mammogram. This may be done every 1-2 years. Talk to your health care provider about how often you should have regular mammograms. Talk with your health care provider about your test results, treatment options,  and if necessary, the need for more tests. Vaccines  Your health care provider may recommend certain  vaccines, such as: Influenza vaccine. This is recommended every year. Tetanus, diphtheria, and acellular pertussis (Tdap, Td) vaccine. You may need a Td booster every 10 years. Zoster vaccine. You may need this after age 6. Pneumococcal 13-valent conjugate (PCV13) vaccine. One dose is recommended after age 98. Pneumococcal polysaccharide (PPSV23) vaccine. One dose is recommended after age 74. Talk to your health care provider about which screenings and vaccines you need and how often you need them. This information is not intended to replace advice given to you by your health care provider. Make sure you discuss any questions you have with your health care provider. Document Released: 02/26/2015 Document Revised: 10/20/2015 Document Reviewed: 12/01/2014 Elsevier Interactive Patient Education  2017 [employer] Prevention in the Home Falls can cause injuries. They can happen to people of all ages. There are many things you can do to make your home safe and to help prevent falls. What can I do on the outside of my home? Regularly fix the edges of walkways and driveways and fix any cracks. Remove anything that might make you trip as you walk through a door, such as a raised step or threshold. Trim any bushes or trees on the path to your home. Use bright outdoor lighting. Clear any walking paths of anything that might make someone trip, such as rocks or tools. Regularly check to see if handrails are loose or broken. Make sure that both sides of any steps have handrails. Any raised decks and porches should have guardrails on the edges. Have any leaves, snow, or ice cleared regularly. Use sand or salt on walking paths during winter. Clean up any spills in your garage right away. This includes oil or grease spills. What can I do in the bathroom? Use night lights. Install grab bars by the toilet and in the tub and shower. Do not use towel bars as grab bars. Use non-skid mats or decals in  the tub or shower. If you need to sit down in the shower, use a plastic, non-slip stool. Keep the floor dry. Clean up any water that spills on the floor as soon as it happens. Remove soap buildup in the tub or shower regularly. Attach bath mats securely with double-sided non-slip rug tape. Do not have throw rugs and other things on the floor that can make you trip. What can I do in the bedroom? Use night lights. Make sure that you have a light by your bed that is easy to reach. Do not use any sheets or blankets that are too big for your bed. They should not hang down onto the floor. Have a firm chair that has side arms. You can use this for support while you get dressed. Do not have throw rugs and other things on the floor that can make you trip. What can I do in the kitchen? Clean up any spills right away. Avoid walking on wet floors. Keep items that you use a lot in easy-to-reach places. If you need to reach something above you, use a strong step stool that has a grab bar. Keep electrical cords out of the way. Do not use floor polish or wax that makes floors slippery. If you must use wax, use non-skid floor wax. Do not have throw rugs and other things on the floor that can make you trip. What can I do with my stairs? Do not leave  any items on the stairs. Make sure that there are handrails on both sides of the stairs and use them. Fix handrails that are broken or loose. Make sure that handrails are as long as the stairways. Check any carpeting to make sure that it is firmly attached to the stairs. Fix any carpet that is loose or worn. Avoid having throw rugs at the top or bottom of the stairs. If you do have throw rugs, attach them to the floor with carpet tape. Make sure that you have a light switch at the top of the stairs and the bottom of the stairs. If you do not have them, ask someone to add them for you. What else can I do to help prevent falls? Wear shoes that: Do not have high  heels. Have rubber bottoms. Are comfortable and fit you well. Are closed at the toe. Do not wear sandals. If you use a stepladder: Make sure that it is fully opened. Do not climb a closed stepladder. Make sure that both sides of the stepladder are locked into place. Ask someone to hold it for you, if possible. Clearly mark and make sure that you can see: Any grab bars or handrails. First and last steps. Where the edge of each step is. Use tools that help you move around (mobility aids) if they are needed. These include: Canes. Walkers. Scooters. Crutches. Turn on the lights when you go into a dark area. Replace any light bulbs as soon as they burn out. Set up your furniture so you have a clear path. Avoid moving your furniture around. If any of your floors are uneven, fix them. If there are any pets around you, be aware of where they are. Review your medicines with your doctor. Some medicines can make you feel dizzy. This can increase your chance of falling. Ask your doctor what other things that you can do to help prevent falls. This information is not intended to replace advice given to you by your health care provider. Make sure you discuss any questions you have with your health care provider. Document Released: 11/26/2008 Document Revised: 07/08/2015 Document Reviewed: 03/06/2014 Elsevier Interactive Patient Education  2017 [employer].

## 2020-11-02 NOTE — Progress Notes (Addendum)
Subjective:   Jordan Grant is a 80 y.o. female who presents for Medicare Annual (Subsequent) preventive examination.  I have reviewed and agree with the above visit documentation Sallee Lange MD [facility]  Review of Systems     Cardiac Risk Factors include: advanced age (>25men, >45 women);dyslipidemia;sedentary lifestyle     Objective:    Today's Vitals   11/02/20 1426  BP: 118/64  Pulse: 85  Temp: 98.1 F (36.7 C)  SpO2: 99%  Weight: 135 lb (61.2 kg)  Height: 5\' 1"  (1.549 m)   Body mass index is 25.51 kg/m.  Advanced Directives 11/02/2020 08/08/2019 08/07/2019  Does Patient Have a Medical Advance Directive? No No No  Would patient like information on creating a medical advance directive? No - Patient declined No - Patient declined -    Current Medications (verified) Outpatient Encounter Medications as of 11/02/2020  Medication Sig   Lactase (DAIRY DIGESTIVE PO) Take by mouth.   levothyroxine (SYNTHROID) 75 MCG tablet 1 qd   omeprazole (PRILOSEC) 40 MG capsule Take one capsule po qd   Polyethyl Glycol-Propyl Glycol (SYSTANE OP) Apply 1 drop to eye daily.    rosuvastatin (CRESTOR) 10 MG tablet Take one tablet po daily   traMADol (ULTRAM) 50 MG tablet 1/2 in the morning 1/2 in the mid afternoon 1 in the evening   No facility-administered encounter medications on file as of 11/02/2020.    Allergies (verified) Codeine, Darvocet [propoxyphene n-acetaminophen], and Pravachol [pravastatin sodium]   History: Past Medical History:  Diagnosis Date   Anxiety    Arthritis    hip   Cataract    hx of   Diverticulosis 2007   History of diverticula   Emphysema lung ([place]) 12/24/2019   GERD (gastroesophageal reflux disease)    Hyperlipidemia    Hypothyroidism    Prediabetes    Past Surgical History:  Procedure Laterality Date   APPENDECTOMY     CATARACT EXTRACTION W/ INTRAOCULAR LENS  IMPLANT, BILATERAL  12/09   Dr Corrin Parker bilateral    COLONOSCOPY     DOPPLER ECHOCARDIOGRAPHY  06/2008   Leg   HEMORROIDECTOMY     ML bone density  07/11   TUBAL LIGATION     YAG LASER APPLICATION Right 10/07/537   Procedure: YAG LASER APPLICATION;  Surgeon: Elta Guadeloupe T. Gershon Crane, MD;  Location: [facility];  Service: Ophthalmology;  Laterality: Right;   YAG LASER APPLICATION Left 7/67/3419   Procedure: YAG LASER APPLICATION;  Surgeon: Elta Guadeloupe T. Gershon Crane, MD;  Location: [facility];  Service: Ophthalmology;  Laterality: Left;   Family History  Problem Relation Age of Onset   Esophageal cancer Mother 80       died at 91   Stomach cancer Mother 16       removed 1/2 of stomach, distal esophageal ca   Breast cancer Maternal Aunt    Colon cancer Neg Hx    Rectal cancer Neg Hx    Social History   Socioeconomic History   Marital status: Married    Spouse name: Clair Gulling   Number of children: 2   Years of education: Not on file   Highest education level: Not on file  Occupational History   Not on file  Tobacco Use   Smoking status: Former    Types: Cigarettes    Quit date: 04/24/1983    Years since quitting: 37.5   Smokeless tobacco: Never  Substance and Sexual Activity   Alcohol use: Yes    Alcohol/week: 11.0  standard drinks    Types: 4 Glasses of wine, 7 Standard drinks or equivalent per week   Drug use: No   Sexual activity: Not Currently    Comment: married, same sexual partner, but not active  Other Topics Concern   Not on file  Social History Narrative   Lives with husband   4 grandchildren and 1 great grandchild.    Social Determinants of Health   Financial Resource Strain: Low Risk    Difficulty of Paying Living Expenses: Not hard at all  Food Insecurity: No Food Insecurity   Worried About Charity fundraiser in the Last Year: Never true   [place] in the Last Year: Never true  Transportation Needs: No Transportation Needs   Lack of Transportation (Medical): No   Lack of Transportation (Non-Medical): No  Physical Activity:  Sufficiently Active   Days of Exercise per Week: 5 days   Minutes of Exercise per Session: 50 min  Stress: No Stress Concern Present   Feeling of Stress : Not at all  Social Connections: Socially Integrated   Frequency of Communication with Friends and Family: More than three times a week   Frequency of Social Gatherings with Friends and Family: Once a week   Attends Religious Services: More than 4 times per year   Active Member of [employer] or Organizations: Yes   Attends Music therapist: More than 4 times per year   Marital Status: Married    Tobacco Counseling Counseling given: Not Answered   Clinical Intake:  Pre-visit preparation completed: Yes  Pain : No/denies pain     BMI - recorded: 25.51 Nutritional Status: BMI 25 -29 Overweight Nutritional Risks: None Diabetes: No  How often do you need to have someone help you when you read instructions, pamphlets, or other written materials from your doctor or pharmacy?: 1 - Never  Diabetic?Pt is pre-diabetic. Diet controlled. No medications.      Information entered by :: Randal Buba, LPN   Activities of Daily Living In your present state of health, do you have any difficulty performing the following activities: 11/02/2020  Hearing? Y  Comment Wears hearing aids  Vision? N  Difficulty concentrating or making decisions? N  Walking or climbing stairs? N  Dressing or bathing? N  Doing errands, shopping? N  Preparing Food and eating ? N  Using the Toilet? N  In the past six months, have you accidently leaked urine? N  Do you have problems with loss of bowel control? N  Managing your Medications? N  Managing your Finances? N  Housekeeping or managing your Housekeeping? N  Some recent data might be hidden    Patient Care Team: Kathyrn Drown, MD as PCP - General (Family Medicine) Satira Sark, MD as Consulting Physician (Cardiology)  Indicate any recent Medical Services you may have received  from other than [facility] providers in the past year (date may be approximate).     Assessment:   This is a routine wellness examination for Jordan Grant.  Hearing/Vision screen Hearing Screening - Comments:: Wears hearing aids. Vision Screening - Comments:: Driving glasses. Eye exam 11/01/20, Dr. Gershon Crane.  Dietary issues and exercise activities discussed: Current Exercise Habits: Home exercise routine, Type of exercise: walking, Time (Minutes): 45, Frequency (Times/Week): 5, Weekly Exercise (Minutes/Week): 225, Intensity: Mild, Exercise limited by: cardiac condition(s)   Goals Addressed             This Visit's Progress    DIET -  INCREASE WATER INTAKE       Pt would like to drink less tea and more water. Pt would like to study her Bible more.        Depression Screen PHQ 2/9 Scores 11/02/2020 05/07/2020 04/08/2020 07/21/2019 12/14/2016 09/28/2015 01/21/2014  PHQ - 2 Score 0 0 0 0 0 0 0    Fall Risk Fall Risk  11/02/2020 04/08/2020 08/15/2019 08/07/2019 07/21/2019  Falls in the past year? 0 0 0 0 0  Comment - - - - -  Number falls in past yr: 0 0 - - -  Injury with Fall? 0 0 - - -  Risk for fall due to : No Fall Risks - - - -  Follow up Falls prevention discussed Falls evaluation completed Falls evaluation completed Falls evaluation completed Falls evaluation completed    [place]:  Any stairs in or around the home? Yes  If so, are there any without handrails? No  Home free of loose throw rugs in walkways, pet beds, electrical cords, etc? Yes  Adequate lighting in your home to reduce risk of falls? Yes   ASSISTIVE DEVICES UTILIZED TO PREVENT FALLS:  Life alert? No  Use of a cane, walker or w/c? No  Grab bars in the bathroom? Yes  Shower chair or bench in shower? Yes  Elevated toilet seat or a handicapped toilet? Yes   TIMED UP AND GO:  Was the test performed? Yes . Ambulated to door without difficulty or devices in 5 seconds.   Cognitive  Function:     6CIT Screen 11/02/2020  What Year? 0 points  What month? 0 points  What time? 0 points  Count back from 20 0 points  Months in reverse 0 points  Repeat phrase 0 points  Total Score 0    Immunizations Immunization History  Administered Date(s) Administered   Fluad Quad(high Dose 65+) 12/17/2018   Influenza, High Dose Seasonal PF 12/14/2016   Influenza,inj,Quad PF,6+ Mos 12/30/2015   Influenza-Unspecified 12/29/2014, 12/29/2014, 12/14/2016, 01/14/2018   Moderna SARS-COV2 Booster Vaccination 12/11/2019   Moderna Sars-Covid-2 Vaccination 02/25/2019, 03/28/2019   Pneumococcal Conjugate-13 09/19/2013   Pneumococcal Polysaccharide-23 01/14/2011   Zoster Recombinat (Shingrix) 01/23/2019, 05/26/2019   Zoster, Live 01/14/2011    TDAP status: Due, Education has been provided regarding the importance of this vaccine. Advised may receive this vaccine at local pharmacy or Health Dept. Aware to provide a copy of the vaccination record if obtained from local pharmacy or Health Dept. Verbalized acceptance and understanding.  Flu Vaccine status: Up to date  Pneumococcal vaccine status: Up to date  Covid-19 vaccine status: Completed vaccines  Qualifies for Shingles Vaccine? Yes   Zostavax completed Yes   Shingrix Completed?: Yes  Screening Tests Health Maintenance  Topic Date Due   TETANUS/TDAP  Never done   COVID-19 Vaccine (4 - Booster for Moderna series) 03/04/2020   INFLUENZA VACCINE  09/13/2020   DEXA SCAN  Completed   Zoster Vaccines- Shingrix  Completed   HPV VACCINES  Aged Out   FOOT EXAM  Discontinued   HEMOGLOBIN A1C  Discontinued   OPHTHALMOLOGY EXAM  Discontinued   URINE MICROALBUMIN  Discontinued    Health Maintenance  Health Maintenance Due  Topic Date Due   TETANUS/TDAP  Never done   COVID-19 Vaccine (4 - Booster for Moderna series) 03/04/2020   INFLUENZA VACCINE  09/13/2020    Colorectal cancer screening: No longer required.   Mammogram  status: Completed 01/30/2020. Repeat  every year  Bone Density status: Completed 07/31/2019. Results reflect: Bone density results: OSTEOPENIA. Repeat every 2 years.  Lung Cancer Screening: (Low Dose CT Chest recommended if Age 40-80 years, 30 pack-year currently smoking OR have quit w/in 15years.) does not qualify.     Additional Screening:  Hepatitis C Screening: does not qualify.  Vision Screening: Recommended annual ophthalmology exams for early detection of glaucoma and other disorders of the eye. Is the patient up to date with their annual eye exam?  Yes  Who is the provider or what is the name of the office in which the patient attends annual eye exams? Dr. Gershon Crane 11/01/20. If pt is not established with a provider, would they like to be referred to a provider to establish care? No .   Dental Screening: Recommended annual dental exams for proper oral hygiene  Community Resource Referral / Chronic Care Management: CRR required this visit?  No   CCM required this visit?  No      Plan:     I have personally reviewed and noted the following in the patient's chart:   Medical and social history Use of alcohol, tobacco or illicit drugs  Current medications and supplements including opioid prescriptions.  Functional ability and status Nutritional status Physical activity Advanced directives List of other physicians Hospitalizations, surgeries, and ER visits in previous 12 months Vitals Screenings to include cognitive, depression, and falls Referrals and appointments  In addition, I have reviewed and discussed with patient certain preventive protocols, quality metrics, and best practice recommendations. A written personalized care plan for preventive services as well as general preventive health recommendations were provided to patient.     Chriss Driver, LPN   5/85/9292   Nurse Notes: Pt states she is doing well. Walks 1-1.5 miles per day. Up to date on vaccines and  wellness exams. Pt is due for Tdap, discussed how to obtain vaccine. Eye exam done 11/01/20 with Dr. Gershon Crane. 6CIT score of 0.

## 2020-11-09 ENCOUNTER — Other Ambulatory Visit: Payer: Self-pay | Admitting: Family Medicine

## 2020-11-15 ENCOUNTER — Ambulatory Visit: Payer: PPO | Admitting: Family Medicine

## 2020-11-19 ENCOUNTER — Ambulatory Visit (INDEPENDENT_AMBULATORY_CARE_PROVIDER_SITE_OTHER): Payer: PPO | Admitting: Family Medicine

## 2020-11-19 ENCOUNTER — Encounter: Payer: Self-pay | Admitting: Family Medicine

## 2020-11-19 ENCOUNTER — Other Ambulatory Visit: Payer: Self-pay

## 2020-11-19 VITALS — BP 122/72 | Temp 97.1°F | Wt 132.6 lb

## 2020-11-19 DIAGNOSIS — E038 Other specified hypothyroidism: Secondary | ICD-10-CM | POA: Diagnosis not present

## 2020-11-19 DIAGNOSIS — E785 Hyperlipidemia, unspecified: Secondary | ICD-10-CM | POA: Diagnosis not present

## 2020-11-19 MED ORDER — TRAMADOL HCL 50 MG PO TABS: ORAL_TABLET | ORAL | 4 refills | Status: DC

## 2020-11-19 NOTE — Progress Notes (Signed)
   Subjective:    Patient ID: Jordan Grant, female    DOB: 04-05-1940, 80 y.o.   MRN: [id number]  HPI Pt had phone visit on 09/15/20 and mentioned hip pain. Provider wanted to have her come back to discuss hip pain.   Pt had lab work complete.  Left hip pain going on for a while but gets worse at times. Pt states right hip hurst sometime but mainly left hip.  Pt still continuing to walk daily as much as she can.   Results for orders placed or performed in visit on 10/28/20  Hemoglobin A1c  Result Value Ref Range   Hgb A1c MFr Bld 6.4 (H) 4.8 - 5.6 %   Est. average glucose Bld gHb Est-mCnc 137 mg/dL    Review of Systems     Objective:   Physical Exam  General-in no acute distress Eyes-no discharge Lungs-respiratory rate normal, CTA CV-no murmurs,RRR Extremities skin warm dry no edema Neuro grossly normal Behavior normal, alert Has some restriction to internal and external rotation of the hips but this is normal for age negative straight leg raise negative back tenderness      Assessment & Plan:   Low back pain I believe this is musculoskeletal I do not feel its a herniated disc I would recommend stretching exercises if it gets worse consider orthopedics as well as injections and physical therapy  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  Hyperlipidemia good control continue current measures  Follow-up in the spring sooner problems

## 2020-12-21 ENCOUNTER — Other Ambulatory Visit (HOSPITAL_COMMUNITY): Payer: Self-pay | Admitting: Family Medicine

## 2020-12-21 DIAGNOSIS — Z1231 Encounter for screening mammogram for malignant neoplasm of breast: Secondary | ICD-10-CM

## 2021-01-31 ENCOUNTER — Other Ambulatory Visit: Payer: Self-pay

## 2021-01-31 ENCOUNTER — Ambulatory Visit (HOSPITAL_COMMUNITY)
Admission: RE | Admit: 2021-01-31 | Discharge: 2021-01-31 | Disposition: A | Discharge status: Home / Self Care | Payer: PPO | Source: Ambulatory Visit | Attending: Family Medicine | Admitting: Family Medicine

## 2021-01-31 DIAGNOSIS — Z1231 Encounter for screening mammogram for malignant neoplasm of breast: Secondary | ICD-10-CM | POA: Diagnosis not present

## 2021-03-09 ENCOUNTER — Other Ambulatory Visit: Payer: Self-pay | Admitting: Family Medicine

## 2021-03-31 ENCOUNTER — Telehealth: Payer: Self-pay | Admitting: Family Medicine

## 2021-03-31 ENCOUNTER — Other Ambulatory Visit: Payer: Self-pay | Admitting: Family Medicine

## 2021-03-31 MED ORDER — ROSUVASTATIN CALCIUM 10 MG PO TABS: ORAL_TABLET | ORAL | 0 refills | Status: DC

## 2021-03-31 NOTE — Telephone Encounter (Signed)
Patient is requesting refill on Rosuvastatin 10 mg called into [facility]

## 2021-04-01 ENCOUNTER — Other Ambulatory Visit: Payer: Self-pay | Admitting: Family Medicine

## 2021-04-19 ENCOUNTER — Ambulatory Visit (INDEPENDENT_AMBULATORY_CARE_PROVIDER_SITE_OTHER): Payer: PPO | Admitting: Family Medicine

## 2021-04-19 ENCOUNTER — Other Ambulatory Visit: Payer: Self-pay

## 2021-04-19 VITALS — BP 110/68 | HR 69 | Temp 97.8°F | Ht 61.0 in | Wt 133.4 lb

## 2021-04-19 DIAGNOSIS — J432 Centrilobular emphysema: Secondary | ICD-10-CM | POA: Diagnosis not present

## 2021-04-19 DIAGNOSIS — I700 Atherosclerosis of aorta: Secondary | ICD-10-CM | POA: Diagnosis not present

## 2021-04-19 DIAGNOSIS — E785 Hyperlipidemia, unspecified: Secondary | ICD-10-CM

## 2021-04-19 DIAGNOSIS — R7303 Prediabetes: Secondary | ICD-10-CM

## 2021-04-19 DIAGNOSIS — E038 Other specified hypothyroidism: Secondary | ICD-10-CM

## 2021-04-19 NOTE — Progress Notes (Signed)
? ?  Subjective:  ? ? Patient ID: Jordan Grant, female    DOB: 1940/09/21, 81 y.o.   MRN: [id number] ? ?HPI ? ?Patient here for 5 month follow up. ?Other specified hypothyroidism - Plan: Hemoglobin A1C, TSH, Basic Metabolic Panel (BMET), Lipid Profile, Hepatic function panel ? ?Hyperlipidemia, unspecified hyperlipidemia type - Plan: Hemoglobin A1C, TSH, Basic Metabolic Panel (BMET), Lipid Profile, Hepatic function panel ? ?Prediabetes - Plan: Hemoglobin A1C, TSH, Basic Metabolic Panel (BMET), Lipid Profile, Hepatic function panel ? ?Aortic atherosclerosis ([place]) ? ?Centrilobular emphysema ([place]) ? ?We did discuss her hypothyroidism she takes her medicine on a regular basis feels like she is doing a good job with this and tolerating well ?She does take her statin we did discuss the importance of rechecking labs reviewed previous labs healthy diet regular activity recommended ?Prediabetes minimize starches she is working hard at minimizing starches and her husband's weight has come down her weights come down 1 pound encouraged her not to try to lose too much weight because of her current BMI ? ?Previous CAT scan showed aortic atherosclerosis as well as central lobar emphysema although she does not have any cough or shortness of breath and did not smoke ?Review of Systems ? ?   ?Objective:  ? Physical Exam ?General-in no acute distress ?Eyes-no discharge ?Lungs-respiratory rate normal, CTA ?CV-no murmurs,RRR ?Extremities skin warm dry no edema ?Neuro grossly normal ?Behavior normal, alert ? ? ? ? ?   ?Assessment & Plan:  ?1. Other specified hypothyroidism ?Takes her medicine regular basis check lab work await results ?- Hemoglobin A1C ?- TSH ?- Basic Metabolic Panel (BMET) ?- Lipid Profile ?- Hepatic function panel ? ?2. Hyperlipidemia, unspecified hyperlipidemia type ?Takes her medicine no problems with the medicine watching her diet closely await lab work ?- Hemoglobin A1C ?- TSH ?- Basic Metabolic Panel (BMET) ?-  Lipid Profile ?- Hepatic function panel ? ?3. Prediabetes ?She does try to cut back on starches and carbohydrates doing much better exercising on a regular basis check A1c ?- Hemoglobin A1C ?- TSH ?- Basic Metabolic Panel (BMET) ?- Lipid Profile ?- Hepatic function panel ? ?4. Aortic atherosclerosis ([place]) ?This was seen on previous CT scan scan continue current medication watch diet closely ? ?5. Centrilobular emphysema ([place]) ?This was seen on previous CT scan she denies any type of chest tightness shortness of breath or other problems not on any type of inhalers for this ? ?Follow-up 6 months ? ? ?

## 2021-04-19 NOTE — Patient Instructions (Signed)
Please do your blood work somewhere within the next few weeks we will let you know the results ? ?We will see you for follow-up in 6 months sooner if any problems TakeCare-Dr. Nicki Reaper ?

## 2021-04-29 DIAGNOSIS — E038 Other specified hypothyroidism: Secondary | ICD-10-CM | POA: Diagnosis not present

## 2021-04-29 DIAGNOSIS — R7303 Prediabetes: Secondary | ICD-10-CM | POA: Diagnosis not present

## 2021-04-29 DIAGNOSIS — E785 Hyperlipidemia, unspecified: Secondary | ICD-10-CM | POA: Diagnosis not present

## 2021-04-30 LAB — LIPID PANEL
Chol/HDL Ratio: 2.5 ratio (ref 0.0–4.4)
Cholesterol, Total: 154 mg/dL (ref 100–199)
HDL: 61 mg/dL (ref >39)
LDL Chol Calc (NIH): 73 mg/dL (ref 0–99)
Triglycerides: 115 mg/dL (ref 0–149)
VLDL Cholesterol Cal: 20 mg/dL (ref 5–40)

## 2021-04-30 LAB — BASIC METABOLIC PANEL
BUN/Creatinine Ratio: 18 (ref 12–28)
BUN: 12 mg/dL (ref 8–27)
CO2: 25 mmol/L (ref 20–29)
Calcium: 9.8 mg/dL (ref 8.7–10.3)
Chloride: 103 mmol/L (ref 96–106)
Creatinine, Ser: 0.68 mg/dL (ref 0.57–1.00)
Glucose: 110 mg/dL — ABNORMAL HIGH (ref 70–99)
Potassium: 4.5 mmol/L (ref 3.5–5.2)
Sodium: 141 mmol/L (ref 134–144)
eGFR: 87 mL/min/{1.73_m2} (ref >59)

## 2021-04-30 LAB — HEPATIC FUNCTION PANEL
ALT: 17 IU/L (ref 0–32)
AST: 25 IU/L (ref 0–40)
Albumin: 4.6 g/dL (ref 3.6–4.6)
Alkaline Phosphatase: 90 IU/L (ref 44–121)
Bilirubin Total: 0.4 mg/dL (ref 0.0–1.2)
Bilirubin, Direct: 0.15 mg/dL (ref 0.00–0.40)
Total Protein: 7.3 g/dL (ref 6.0–8.5)

## 2021-04-30 LAB — HEMOGLOBIN A1C
Est. average glucose Bld gHb Est-mCnc: 131 mg/dL
Hgb A1c MFr Bld: 6.2 % — ABNORMAL HIGH (ref 4.8–5.6)

## 2021-04-30 LAB — TSH: TSH: 0.834 u[IU]/mL (ref 0.450–4.500)

## 2021-06-13 ENCOUNTER — Ambulatory Visit (INDEPENDENT_AMBULATORY_CARE_PROVIDER_SITE_OTHER): Payer: PPO | Admitting: Family Medicine

## 2021-06-13 ENCOUNTER — Encounter: Payer: Self-pay | Admitting: Family Medicine

## 2021-06-13 VITALS — BP 128/77 | HR 84 | Temp 98.8°F | Ht 61.0 in | Wt 132.0 lb

## 2021-06-13 DIAGNOSIS — Z780 Asymptomatic menopausal state: Secondary | ICD-10-CM | POA: Diagnosis not present

## 2021-06-13 DIAGNOSIS — M7581 Other shoulder lesions, right shoulder: Secondary | ICD-10-CM

## 2021-06-13 MED ORDER — OMEPRAZOLE 40 MG PO CPDR: DELAYED_RELEASE_CAPSULE | ORAL | 1 refills | Status: DC

## 2021-06-13 MED ORDER — TRAMADOL HCL 50 MG PO TABS: ORAL_TABLET | ORAL | 4 refills | Status: DC

## 2021-06-13 MED ORDER — ROSUVASTATIN CALCIUM 10 MG PO TABS: ORAL_TABLET | ORAL | 1 refills | Status: DC

## 2021-06-13 NOTE — Progress Notes (Signed)
? ?  Subjective:  ? ? Patient ID: Jordan Grant, female    DOB: 1940-07-05, 81 y.o.   MRN: [id number] ? ?HPI ?Right arm pain x 1 week - pain with ADL's starting at shoulder - dull ache  ?Patient relates right shoulder pain discomfort radiates down the back of her shoulder does not go down into the elbow or the hand no weakness noted denies any injury difficult to raise her arm/shoulder above shoulder level ?Denies numbness tingling weakness ? ?Review of Systems ? ?   ?Objective:  ? Physical Exam ?Tenderness in the shoulder especially posterior aspect decreased range of motion no rotator cuff tear noted on exam ? ? ? ?   ?Assessment & Plan:  ?Shoulder tendinitis recommend range of motion exercises cool compresses also recommended Ortho consultation possible injections ? ?Try to avoid NSAIDs because of age ? ?

## 2021-06-13 NOTE — Patient Instructions (Signed)
[place] ? ?We will connect with orthopedics ?Dr.Cairns office will reach out to you to set up an appointment in the near future ? ?We will also work on setting you up for a bone density test at the end of June ? ?I will see you in September when you follow-up ?Sooner if you are having any problems ?TakeCare-Dr. Nicki Reaper ?

## 2021-06-24 ENCOUNTER — Ambulatory Visit: Payer: PPO | Admitting: Orthopedic Surgery

## 2021-06-24 ENCOUNTER — Ambulatory Visit (INDEPENDENT_AMBULATORY_CARE_PROVIDER_SITE_OTHER): Payer: PPO

## 2021-06-24 ENCOUNTER — Encounter: Payer: Self-pay | Admitting: Orthopedic Surgery

## 2021-06-24 DIAGNOSIS — M25511 Pain in right shoulder: Secondary | ICD-10-CM

## 2021-06-24 NOTE — Patient Instructions (Signed)

## 2021-06-24 NOTE — Progress Notes (Signed)
New Patient Visit ? ?Assessment: ?Jordan Grant is a 81 y.o. female with the following: ?1. Acute pain of right shoulder ? ?Plan: ?Jordan Grant has had pain in her right shoulder for a couple of weeks.  No specific injury.  Radiographs without evidence of acute or chronic injury.  She has difficulty with overhead motion.  She has decreased function overall.  She has a remote history of a steroid injection providing excellent relief.  We discussed this in clinic today, and she elected to proceed.  I provided her with exercises, and urged her to continue to improve her range of motion.  She has any further issues, I have asked her to return to clinic. ? ?Procedure note injection - Right shoulder  ?  ?Verbal consent was obtained to inject the right shoulder, subacromial space ?Timeout was completed to confirm the site of injection.   ?The skin was prepped with alcohol and ethyl chloride was sprayed at the injection site.  ?A 21-gauge needle was used to inject 40 mg of Depo-Medrol and 1% lidocaine (3 cc) into the subacromial space of the right shoulder using a posterolateral approach.  ?There were no complications.  ?A sterile bandage was applied.  ? ? ?Follow-up: ?Return if symptoms worsen or fail to improve. ? ?Subjective: ? ?Chief Complaint  ?Patient presents with  ? Shoulder Pain  ?  Right shld pain for a few weeks now,  post and ant shld pain, decreased ROM, sore in the day time, aches at night. Not taking any meds specifically for shld, but takes tramadol 1/2 tab TID.   ? ? ?History of Present Illness: ?Jordan Grant is a 81 y.o. female who has been referred by  Sallee Lange, MD for evaluation of right shoulder pain.  She has had pain in the right shoulder for the past 2-3 weeks.  Pain is both anterior and posterior.  She has noted to have decreased range of motion.  She has difficulty with overhead motion.  Pain gets worse at night.  She states that she had issues with the shoulder many years ago.   She had an injection at that time, which improved her symptoms.  She is not taking medicine specifically for her shoulder.  No recent injections.  No therapy. ? ? ?Review of Systems: ?No fevers or chills ?No numbness or tingling ?No chest pain ?No shortness of breath ?No bowel or bladder dysfunction ?No GI distress ?No headaches ? ? ?Medical History: ? ?Past Medical History:  ?Diagnosis Date  ? Anxiety   ? Arthritis   ? hip  ? Cataract   ? hx of  ? Diverticulosis 2007  ? History of diverticula  ? Emphysema lung ([place]) 12/24/2019  ? GERD (gastroesophageal reflux disease)   ? Hyperlipidemia   ? Hypothyroidism   ? Prediabetes   ? ? ?Past Surgical History:  ?Procedure Laterality Date  ? APPENDECTOMY    ? CATARACT EXTRACTION W/ INTRAOCULAR LENS  IMPLANT, BILATERAL  12/09  ? Dr Corrin Parker bilateral  ? COLONOSCOPY    ? DOPPLER ECHOCARDIOGRAPHY  06/2008  ? Leg  ? HEMORROIDECTOMY    ? ML bone density  07/11  ? TUBAL LIGATION    ? YAG LASER APPLICATION Right 6/96/7893  ? Procedure: YAG LASER APPLICATION;  Surgeon: Elta Guadeloupe T. Gershon Crane, MD;  Location: [facility];  Service: Ophthalmology;  Laterality: Right;  ? YAG LASER APPLICATION Left 09/22/1749  ? Procedure: YAG LASER APPLICATION;  Surgeon: Elta Guadeloupe T. Gershon Crane, MD;  Location: [facility]  [facility];  Service: Ophthalmology;  Laterality: Left;  ? ? ?Family History  ?Problem Relation Age of Onset  ? Esophageal cancer Mother 57  ?     died at 67  ? Stomach cancer Mother 65  ?     removed 1/2 of stomach, distal esophageal ca  ? Breast cancer Maternal Aunt   ? Colon cancer Neg Hx   ? Rectal cancer Neg Hx   ? ?Social History  ? ?Tobacco Use  ? Smoking status: Former  ?  Types: Cigarettes  ?  Quit date: 04/24/1983  ?  Years since quitting: 38.1  ? Smokeless tobacco: Never  ?Substance Use Topics  ? Alcohol use: Yes  ?  Alcohol/week: 11.0 standard drinks  ?  Types: 4 Glasses of wine, 7 Standard drinks or equivalent per week  ? Drug use: No  ? ? ?Allergies  ?Allergen Reactions  ? Codeine   ?  Nausea, dizziness  ?  Darvocet [Propoxyphene N-Acetaminophen]   ?  nausea  ? Pravachol [Pravastatin Sodium]   ?  Joint pain  ? ? ?Current Meds  ?Medication Sig  ? Lactase (DAIRY DIGESTIVE PO) Take by mouth.  ? levothyroxine (SYNTHROID) 75 MCG tablet TAKE 1 TABLET BY MOUTH EVERY DAY  ? omeprazole (PRILOSEC) 40 MG capsule TAKE 1 CAPSULE BY MOUTH EVERY DAY  ? Polyethyl Glycol-Propyl Glycol (SYSTANE OP) Apply 1 drop to eye daily.   ? rosuvastatin (CRESTOR) 10 MG tablet Take one tablet po daily  ? traMADol (ULTRAM) 50 MG tablet TAKE 1/2 TABLET EVERY MORNING AND MID AFTERNOON THEN 1 TABLET EVERY EVENING.  ? ? ?Objective: ?There were no vitals taken for this visit. ? ?Physical Exam: ? ?General: Alert and oriented. and No acute distress. ?Gait: Normal gait. ? ?Right shoulder without deformity.  Painful forward flexion beyond 90 degrees.  Passively I get her to 120 before becomes too painful.  Mild discomfort in the empty can testing position.  She has pain in the 90/90 position.  Tenderness over the anterior shoulder.  Pain with biceps provocative testing. ? ?IMAGING: ?I personally ordered and reviewed the following images ? ?X-rays of the right shoulder were obtained in clinic today.  No acute injuries are noted.  Mild degenerative changes within the glenohumeral joint, but well-maintained joint space.  No evidence of proximal humeral migration.  Narrowing of the [facility] joint, with some associated spurring. ? ?Impression: Right shoulder x-rays with mild degenerative changes. ? ? ?New Medications:  ?No orders of the defined types were placed in this encounter. ? ? ? ? ?Mordecai Rasmussen, MD ? ?06/24/2021 ?11:26 PM ? ? ?

## 2021-08-08 ENCOUNTER — Ambulatory Visit (HOSPITAL_COMMUNITY)
Admission: RE | Admit: 2021-08-08 | Discharge: 2021-08-08 | Disposition: A | Discharge status: Home / Self Care | Payer: PPO | Source: Ambulatory Visit | Attending: Family Medicine | Admitting: Family Medicine

## 2021-08-08 DIAGNOSIS — M85852 Other specified disorders of bone density and structure, left thigh: Secondary | ICD-10-CM | POA: Diagnosis not present

## 2021-08-08 DIAGNOSIS — M810 Age-related osteoporosis without current pathological fracture: Secondary | ICD-10-CM | POA: Diagnosis not present

## 2021-08-08 DIAGNOSIS — Z780 Asymptomatic menopausal state: Secondary | ICD-10-CM | POA: Diagnosis not present

## 2021-08-17 ENCOUNTER — Ambulatory Visit (INDEPENDENT_AMBULATORY_CARE_PROVIDER_SITE_OTHER): Payer: PPO | Admitting: Family Medicine

## 2021-08-17 ENCOUNTER — Encounter: Payer: Self-pay | Admitting: Family Medicine

## 2021-08-17 VITALS — BP 128/70 | Wt 133.8 lb

## 2021-08-17 DIAGNOSIS — M816 Localized osteoporosis [Lequesne]: Secondary | ICD-10-CM

## 2021-08-17 MED ORDER — ALENDRONATE SODIUM 70 MG PO TABS
70.0000 mg | ORAL_TABLET | ORAL | 11 refills | Status: DC
Start: 2021-08-17 — End: 2022-07-11

## 2021-08-17 NOTE — Progress Notes (Signed)
   Subjective:    Patient ID: Jordan Grant, female    DOB: Sep 03, 1940, 81 y.o.   MRN: [id number]  HPI Pt arrives to discuss recent bone density scan. Provider recommended office visit to discuss treatment. 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 Increased risk of fracture  Should also be noted that patient has been on tramadol for period of time for arthralgias and joint pains but she realizes as she gets older it would be wise for her to taper off of this medicine we have discussed this before with her   Pt states the pain she has in right arm and left leg she has noticed some intermittent sharp pains no consistent pain does not wake her up in the middle of the night   Review of Systems     Objective:   Physical Exam  General-in no acute distress Eyes-no discharge Lungs-respiratory rate normal, CTA CV-no murmurs,RRR Extremities skin warm dry no edema Neuro grossly normal Behavior normal, alert       Assessment & Plan:  We reviewed over of the bone density in detail showing the images discussing the medication pluses and minuses 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 After shared discussion start Fosamax 70 mg once weekly More than likely target 3 to 5 years Repeat bone density in 2 years Side effects were discussed in detail including possibility of jaw necrosis and also discussed the possibility of gastritis issues and if any stomach symptoms stop medicine immediately and notify us  Tramadol-it would be to her advantage to taper this medicine I laid out for her a gradual taper over several weeks This should help her bring this down to a smaller dose and eventually be off of the medicine over the course of the next several weeks she will let us know if she is having any side effects or problems

## 2021-09-21 ENCOUNTER — Other Ambulatory Visit: Payer: Self-pay | Admitting: Family Medicine

## 2021-10-20 ENCOUNTER — Ambulatory Visit: Payer: Self-pay | Admitting: Family Medicine

## 2021-11-29 DIAGNOSIS — H52203 Unspecified astigmatism, bilateral: Secondary | ICD-10-CM | POA: Diagnosis not present

## 2021-11-29 DIAGNOSIS — Z961 Presence of intraocular lens: Secondary | ICD-10-CM | POA: Diagnosis not present

## 2021-11-29 DIAGNOSIS — H524 Presbyopia: Secondary | ICD-10-CM | POA: Diagnosis not present

## 2021-12-14 ENCOUNTER — Ambulatory Visit (INDEPENDENT_AMBULATORY_CARE_PROVIDER_SITE_OTHER): Payer: PPO | Admitting: Family Medicine

## 2021-12-14 VITALS — BP 132/81 | HR 71 | Temp 98.2°F | Ht 61.0 in | Wt 135.0 lb

## 2021-12-14 DIAGNOSIS — M7581 Other shoulder lesions, right shoulder: Secondary | ICD-10-CM | POA: Diagnosis not present

## 2021-12-14 DIAGNOSIS — M19041 Primary osteoarthritis, right hand: Secondary | ICD-10-CM | POA: Diagnosis not present

## 2021-12-14 DIAGNOSIS — E785 Hyperlipidemia, unspecified: Secondary | ICD-10-CM

## 2021-12-14 DIAGNOSIS — R7303 Prediabetes: Secondary | ICD-10-CM | POA: Diagnosis not present

## 2021-12-14 DIAGNOSIS — Z23 Encounter for immunization: Secondary | ICD-10-CM

## 2021-12-14 DIAGNOSIS — E038 Other specified hypothyroidism: Secondary | ICD-10-CM

## 2021-12-14 DIAGNOSIS — H6121 Impacted cerumen, right ear: Secondary | ICD-10-CM

## 2021-12-14 MED ORDER — OMEPRAZOLE 40 MG PO CPDR: DELAYED_RELEASE_CAPSULE | ORAL | 1 refills | Status: DC

## 2021-12-14 MED ORDER — TRAMADOL HCL 50 MG PO TABS: ORAL_TABLET | ORAL | 4 refills | Status: DC

## 2021-12-14 MED ORDER — LEVOTHYROXINE SODIUM 75 MCG PO TABS
75.0000 ug | ORAL_TABLET | Freq: Every day | ORAL | 0 refills | Status: DC
Start: 2021-12-14 — End: 2022-01-31

## 2021-12-14 NOTE — Progress Notes (Signed)
   Subjective:    Patient ID: Estill Bamberg, female    DOB: 03-08-1940, 81 y.o.   MRN: [id number]  HPI  Right shoulder pain ongoing - has seen orthopedics for it , request Tramadol rx Needs lab orders for next week appt 6 month  Patient denies any shoulder injury. Patient patient does relate pain in the shoulder radiates to the elbow and she also relates how her third and fourth finger ache in the morning time Review of Systems     Objective:   Physical Exam General-in no acute distress Eyes-no discharge Lungs-respiratory rate normal, CTA CV-no murmurs,RRR Extremities skin warm dry no edema Neuro grossly normal Behavior normal, alert        Assessment & Plan:  1. Other specified hypothyroidism Continue medication check TSH has a standard follow-up visit near future - Hemoglobin A4Z - Basic Metabolic Panel - Hepatic Function Panel - Lipid Panel - TSH  2. Hyperlipidemia, unspecified hyperlipidemia type Check lipid watch diet - Hemoglobin Y6A - Basic Metabolic Panel - Hepatic Function Panel - Lipid Panel - TSH  3. Prediabetes Watch diet minimize sugars check A1c - Hemoglobin Y3K - Basic Metabolic Panel - Hepatic Function Panel - Lipid Panel - TSH  4. Tendinitis of right rotator cuff Gentle range of motion exercises, tramadol for severe pain, see Dr.Cairns possibly will need another injection and physical therapy if that does not help enough next step would be possible MRI - Ambulatory referral to Orthopedics  5. Localized primary osteoarthritis of right hand Tramadol although this is expected for age in regards to the osteoarthritis - Ambulatory referral to Orthopedics  6. Flu vaccine need Flu vaccine - Flu Vaccine QUAD High Dose(Fluad)  7. Impacted cerumen of right ear Ear irrigation - Ear wax removal

## 2021-12-26 ENCOUNTER — Ambulatory Visit: Payer: PPO | Admitting: Family Medicine

## 2021-12-27 ENCOUNTER — Ambulatory Visit (INDEPENDENT_AMBULATORY_CARE_PROVIDER_SITE_OTHER): Payer: PPO | Admitting: Orthopedic Surgery

## 2021-12-27 ENCOUNTER — Encounter: Payer: Self-pay | Admitting: Orthopedic Surgery

## 2021-12-27 DIAGNOSIS — M7581 Other shoulder lesions, right shoulder: Secondary | ICD-10-CM | POA: Diagnosis not present

## 2021-12-27 MED ORDER — METHYLPREDNISOLONE ACETATE 40 MG/ML IJ SUSP
40.0000 mg | Freq: Once | INTRAMUSCULAR | Status: AC
  Administered 2021-12-27: 40 mg via INTRA_ARTICULAR

## 2021-12-27 NOTE — Patient Instructions (Signed)

## 2021-12-27 NOTE — Progress Notes (Signed)
Return patient Visit  Assessment: Jordan Grant is a 81 y.o. female with the following: 1.  Right shoulder, rotator cuff tendinitis  Plan: Jordan Grant continues to have pain in her right shoulder.  Prior steroid injection provided relief of her pain for approximately 2 months.  The pain has progressively returned.  She is interested in a repeat steroid injection.  This was completed in clinic today without issues.  She is also interested in working with occupational therapy.  We placed a referral for OT today.  Follow-up as needed.  Procedure note injection - Right shoulder    Verbal consent was obtained to inject the right shoulder, subacromial space Timeout was completed to confirm the site of injection.   The skin was prepped with alcohol and ethyl chloride was sprayed at the injection site.  A 21-gauge needle was used to inject 40 mg of Depo-Medrol and 1% lidocaine (3 cc) into the subacromial space of the right shoulder using a posterolateral approach.  There were no complications.  A sterile bandage was applied.    Follow-up: Return if symptoms worsen or fail to improve.  Subjective:  Chief Complaint  Patient presents with   Shoulder Pain    Wants injection right shoulder     History of Present Illness: Jordan Grant is a 81 y.o. female who returns to clinic today for repeat evaluation of her right shoulder.  I saw her in clinic several months ago.  We injected her right shoulder.  This improved her symptoms for a couple of months.  However, the pain is progressively returned.  She continues to have difficulty with overhead motion.  She is not taking medications on a consistent basis.  She has yet to work with therapy.   Review of Systems: No fevers or chills No numbness or tingling No chest pain No shortness of breath No bowel or bladder dysfunction No GI distress No headaches   Objective: There were no vitals taken for this visit.  Physical  Exam:  General: Alert and oriented. and No acute distress. Gait: Normal gait.  Right shoulder without deformity.  Painful forward flexion beyond 90 degrees.  Passively I get her to 120 before becomes too painful.  Mild discomfort in the empty can testing position.  She has pain in the 90/90 position.  Tenderness over the anterior shoulder.  Pain with biceps provocative testing.  IMAGING: I personally ordered and reviewed the following images  No new imaging obtained today.  New Medications:  Meds ordered this encounter  Medications   methylPREDNISolone acetate (DEPO-MEDROL) injection 40 mg      Jordan Rasmussen, MD  12/27/2021 7:01 PM

## 2021-12-29 ENCOUNTER — Telehealth: Payer: Self-pay | Admitting: Family Medicine

## 2021-12-29 ENCOUNTER — Other Ambulatory Visit (HOSPITAL_COMMUNITY): Payer: Self-pay | Admitting: Family Medicine

## 2021-12-29 DIAGNOSIS — Z1231 Encounter for screening mammogram for malignant neoplasm of breast: Secondary | ICD-10-CM

## 2021-12-29 NOTE — Telephone Encounter (Signed)
FYI:  Patient does not want to have an AWV. She is fine with seeing Dr. Nicki Reaper every 6 months.  CB# [phone]

## 2021-12-30 NOTE — Telephone Encounter (Signed)
[facility] [place] updated

## 2021-12-30 NOTE — Telephone Encounter (Signed)
Jordan Grant, can you decline the AWV for the patient? I am not sure if that will satisfy the quality metric or not but she isn't interested in having one.  Thank you, Larene Beach

## 2022-01-13 DIAGNOSIS — E038 Other specified hypothyroidism: Secondary | ICD-10-CM | POA: Diagnosis not present

## 2022-01-13 DIAGNOSIS — R7303 Prediabetes: Secondary | ICD-10-CM | POA: Diagnosis not present

## 2022-01-13 DIAGNOSIS — E785 Hyperlipidemia, unspecified: Secondary | ICD-10-CM | POA: Diagnosis not present

## 2022-01-14 LAB — HEPATIC FUNCTION PANEL
ALT: 19 IU/L (ref 0–32)
AST: 22 IU/L (ref 0–40)
Albumin: 4.3 g/dL (ref 3.7–4.7)
Alkaline Phosphatase: 80 IU/L (ref 44–121)
Bilirubin Total: 0.5 mg/dL (ref 0.0–1.2)
Bilirubin, Direct: 0.16 mg/dL (ref 0.00–0.40)
Total Protein: 7.1 g/dL (ref 6.0–8.5)

## 2022-01-14 LAB — LIPID PANEL
Chol/HDL Ratio: 2.2 ratio (ref 0.0–4.4)
Cholesterol, Total: 151 mg/dL (ref 100–199)
HDL: 70 mg/dL (ref >39)
LDL Chol Calc (NIH): 67 mg/dL (ref 0–99)
Triglycerides: 75 mg/dL (ref 0–149)
VLDL Cholesterol Cal: 14 mg/dL (ref 5–40)

## 2022-01-14 LAB — HEMOGLOBIN A1C
Est. average glucose Bld gHb Est-mCnc: 137 mg/dL
Hgb A1c MFr Bld: 6.4 % — ABNORMAL HIGH (ref 4.8–5.6)

## 2022-01-14 LAB — BASIC METABOLIC PANEL
BUN/Creatinine Ratio: 19 (ref 12–28)
BUN: 14 mg/dL (ref 8–27)
CO2: 25 mmol/L (ref 20–29)
Calcium: 9.4 mg/dL (ref 8.7–10.3)
Chloride: 102 mmol/L (ref 96–106)
Creatinine, Ser: 0.75 mg/dL (ref 0.57–1.00)
Glucose: 107 mg/dL — ABNORMAL HIGH (ref 70–99)
Potassium: 4.6 mmol/L (ref 3.5–5.2)
Sodium: 139 mmol/L (ref 134–144)
eGFR: 79 mL/min/{1.73_m2} (ref >59)

## 2022-01-14 LAB — TSH: TSH: 1.21 u[IU]/mL (ref 0.450–4.500)

## 2022-01-23 ENCOUNTER — Telehealth: Payer: Self-pay | Admitting: Radiology

## 2022-01-23 NOTE — Telephone Encounter (Signed)
FYI only patient has not returned calls to schedule therapy for shoulder, I have close referral.

## 2022-01-31 ENCOUNTER — Ambulatory Visit (INDEPENDENT_AMBULATORY_CARE_PROVIDER_SITE_OTHER): Payer: PPO | Admitting: Family Medicine

## 2022-01-31 VITALS — BP 128/68 | HR 69 | Temp 97.5°F | Ht 61.0 in | Wt 136.0 lb

## 2022-01-31 DIAGNOSIS — R7303 Prediabetes: Secondary | ICD-10-CM | POA: Diagnosis not present

## 2022-01-31 DIAGNOSIS — E038 Other specified hypothyroidism: Secondary | ICD-10-CM | POA: Diagnosis not present

## 2022-01-31 DIAGNOSIS — E785 Hyperlipidemia, unspecified: Secondary | ICD-10-CM

## 2022-01-31 MED ORDER — TRAMADOL HCL 50 MG PO TABS: ORAL_TABLET | ORAL | 5 refills | Status: DC

## 2022-01-31 MED ORDER — LEVOTHYROXINE SODIUM 75 MCG PO TABS: 75.0000 ug | ORAL_TABLET | Freq: Every day | ORAL | 1 refills | Status: DC

## 2022-01-31 MED ORDER — ROSUVASTATIN CALCIUM 10 MG PO TABS: ORAL_TABLET | ORAL | 1 refills | Status: DC

## 2022-01-31 NOTE — Progress Notes (Signed)
   Subjective:    Patient ID: Jordan Grant, female    DOB: [birth year], 81 y.o.   MRN: [id number]  HPI Other specified hypothyroidism  Hyperlipidemia, unspecified hyperlipidemia type  Prediabetes  Results for orders placed or performed in visit on 12/14/21  Hemoglobin A1c  Result Value Ref Range   Hgb A1c MFr Bld 6.4 (H) 4.8 - 5.6 %   Est. average glucose Bld gHb Est-mCnc 137 mg/dL  Basic Metabolic Panel  Result Value Ref Range   Glucose 107 (H) 70 - 99 mg/dL   BUN 14 8 - 27 mg/dL   Creatinine, Ser 0.75 0.57 - 1.00 mg/dL   eGFR 79 >59 mL/min/1.73   BUN/Creatinine Ratio 19 12 - 28   Sodium 139 134 - 144 mmol/L   Potassium 4.6 3.5 - 5.2 mmol/L   Chloride 102 96 - 106 mmol/L   CO2 25 20 - 29 mmol/L   Calcium 9.4 8.7 - 10.3 mg/dL  Hepatic Function Panel  Result Value Ref Range   Total Protein 7.1 6.0 - 8.5 g/dL   Albumin 4.3 3.7 - 4.7 g/dL   Bilirubin Total 0.5 0.0 - 1.2 mg/dL   Bilirubin, Direct 0.16 0.00 - 0.40 mg/dL   Alkaline Phosphatase 80 44 - 121 IU/L   AST 22 0 - 40 IU/L   ALT 19 0 - 32 IU/L  Lipid Panel  Result Value Ref Range   Cholesterol, Total 151 100 - 199 mg/dL   Triglycerides 75 0 - 149 mg/dL   HDL 70 >39 mg/dL   VLDL Cholesterol Cal 14 5 - 40 mg/dL   LDL Chol Calc (NIH) 67 0 - 99 mg/dL   Chol/HDL Ratio 2.2 0.0 - 4.4 ratio  TSH  Result Value Ref Range   TSH 1.210 0.450 - 4.500 uIU/mL    Labs reviewed with the patient A1c has gone up She is trying to do the best she can and trying to eat healthier She takes her cholesterol medicine regular basis She does try to eat relatively healthy She states she takes tramadol approximately 2/day Does not cause drowsiness.  States she needs it to help with joint discomforts.  Takes her thyroid medicine regular basis  Review of Systems     Objective:   Physical Exam General-in no acute distress Eyes-no discharge Lungs-respiratory rate normal, CTA CV-no murmurs,RRR Extremities skin warm dry no  edema Neuro grossly normal Behavior normal, alert        Assessment & Plan:  1. Other specified hypothyroidism Thyroid under good control continue current medication watch diet  2. Hyperlipidemia, unspecified hyperlipidemia type Hyperlipidemia-importance of diet, weight control, activity, compliance with medications discussed.   Recent labs reviewed.   Any additional labs or refills ordered.   Importance of keeping under good control discussed. Regular follow-up visits discussed Continue current medications good control  3. Prediabetes A1c has gone up 6.4 very important to keep starches under good control check lab work before next visit  Overall health doing well Do her due for her bone density in 2025

## 2022-02-01 ENCOUNTER — Telehealth: Payer: Self-pay | Admitting: Family Medicine

## 2022-02-01 NOTE — Telephone Encounter (Signed)
I spoke with patient yesterday at her office visit She stated she was interested in the physical therapy but stated that for some reason she did not hear-would you be so kind as to reconnect with her-she voices willingness-she verified her phone number under snapshot-[phone]

## 2022-02-01 NOTE — Telephone Encounter (Signed)
,   [phone] is the phone number to call, I gave her the number   She states she wants to go in January, I told her to call soon to schedule in January, they probably do not have openings until then anyway.  She voiced understanding

## 2022-02-01 NOTE — Telephone Encounter (Signed)
I will reopen the referral and have patient call to schedule thank you so much.

## 2022-02-08 ENCOUNTER — Ambulatory Visit (HOSPITAL_COMMUNITY)
Admission: RE | Admit: 2022-02-08 | Discharge: 2022-02-08 | Disposition: A | Discharge status: Home / Self Care | Payer: PPO | Source: Ambulatory Visit | Attending: Family Medicine | Admitting: Family Medicine

## 2022-02-08 DIAGNOSIS — Z1231 Encounter for screening mammogram for malignant neoplasm of breast: Secondary | ICD-10-CM | POA: Insufficient documentation

## 2022-02-17 ENCOUNTER — Ambulatory Visit (HOSPITAL_COMMUNITY): Payer: PPO | Admitting: Occupational Therapy

## 2022-03-21 ENCOUNTER — Telehealth: Payer: Self-pay | Admitting: Orthopedic Surgery

## 2022-03-21 DIAGNOSIS — M7581 Other shoulder lesions, right shoulder: Secondary | ICD-10-CM

## 2022-03-21 NOTE — Telephone Encounter (Signed)
Patient lvm regarding PT, she stated no ones answers when she calls and she needs help/

## 2022-03-22 NOTE — Telephone Encounter (Signed)
Spoke w/ pt who states she hadn't been able to make previous appointments because she was sick. Wanted the to check # for therapy to make sure she was calling the right place. Gave pt # and placed a new referral, since last referral was from 11/23. Let pt know that she could just wait for a call since I placed a new referral.

## 2022-03-28 ENCOUNTER — Telehealth: Payer: Self-pay

## 2022-03-28 NOTE — Telephone Encounter (Signed)
90 tablets with one refill sent to pharmacy on 01/31/22. Attempted to contact pharmacy but no answer-will try call again later

## 2022-03-28 NOTE — Telephone Encounter (Signed)
Encourage patient to contact the pharmacy for refills or they can request refills through [facility]  (Please schedule appointment if patient has not been seen in over a year)    [place] THIS SENT TO: [facility] on [address]  levothyroxine (SYNTHROID) 75 MCG tablet  MEDICATION NAME & DOSE:  NOTES/COMMENTS FROM PATIENT:Pt called pharmacy twice last week for this and now she is down to one for [employer] office please notify patient: It takes 48-72 hours to process rx refill requests Ask patient to call pharmacy to ensure rx is ready before heading there.

## 2022-03-28 NOTE — Telephone Encounter (Signed)
Pharmacy states that pt did not pick up December script. Pharmacy will get script ready for pick up. Pt contacted and made aware.

## 2022-03-29 ENCOUNTER — Ambulatory Visit (HOSPITAL_COMMUNITY): Payer: PPO | Attending: Orthopedic Surgery | Admitting: Occupational Therapy

## 2022-03-29 ENCOUNTER — Other Ambulatory Visit: Payer: Self-pay

## 2022-03-29 ENCOUNTER — Other Ambulatory Visit: Payer: Self-pay | Admitting: Family Medicine

## 2022-03-29 ENCOUNTER — Encounter (HOSPITAL_COMMUNITY): Payer: Self-pay | Admitting: Occupational Therapy

## 2022-03-29 DIAGNOSIS — M25511 Pain in right shoulder: Secondary | ICD-10-CM | POA: Insufficient documentation

## 2022-03-29 DIAGNOSIS — G8929 Other chronic pain: Secondary | ICD-10-CM | POA: Insufficient documentation

## 2022-03-29 DIAGNOSIS — R29898 Other symptoms and signs involving the musculoskeletal system: Secondary | ICD-10-CM | POA: Diagnosis not present

## 2022-03-29 DIAGNOSIS — M7581 Other shoulder lesions, right shoulder: Secondary | ICD-10-CM | POA: Insufficient documentation

## 2022-03-29 NOTE — Therapy (Signed)
OUTPATIENT OCCUPATIONAL THERAPY ORTHO EVALUATION  Patient Name: Jordan Grant MRN: [id number] DOB:1940/09/26, 82 y.o., female Today's Date: 03/29/2022  PCP: Dr. Sallee Lange REFERRING PROVIDER: Dr. Larena Glassman  END OF SESSION:  OT End of Session - 03/29/22 1023     Visit Number 1    Number of Visits 3    Date for OT Re-Evaluation 04/28/22    Authorization Type [employer]    Progress Note Due on Visit 10    OT Start Time [phone]    OT Stop Time 1020    OT Time Calculation (min) 31 min    Activity Tolerance Patient tolerated treatment well    Behavior During Therapy [facility] for tasks assessed/performed             Past Medical History:  Diagnosis Date   Anxiety    Arthritis    hip   Cataract    hx of   Diverticulosis 2007   History of diverticula   Emphysema lung ([place]) 12/24/2019   GERD (gastroesophageal reflux disease)    Hyperlipidemia    Hypothyroidism    Prediabetes    Past Surgical History:  Procedure Laterality Date   APPENDECTOMY     CATARACT EXTRACTION W/ INTRAOCULAR LENS  IMPLANT, BILATERAL  12/09   Dr Corrin Parker bilateral   COLONOSCOPY     DOPPLER ECHOCARDIOGRAPHY  06/2008   Leg   HEMORROIDECTOMY     ML bone density  07/11   TUBAL LIGATION     YAG LASER APPLICATION Right [id number]   Procedure: YAG LASER APPLICATION;  Surgeon: Elta Guadeloupe T. Gershon Crane, MD;  Location: [facility];  Service: Ophthalmology;  Laterality: Right;   YAG LASER APPLICATION Left [id number]   Procedure: YAG LASER APPLICATION;  Surgeon: Elta Guadeloupe T. Gershon Crane, MD;  Location: [facility];  Service: Ophthalmology;  Laterality: Left;   Patient Active Problem List   Diagnosis Date Noted   Localized osteoporosis without current pathological fracture 08/17/2021   Myalgia due to statin 05/07/2020   Aortic atherosclerosis ([place]) 12/24/2019   Emphysema lung ([place]) 12/24/2019   CAP (community acquired pneumonia) 08/08/2019   Acute bronchitis 08/08/2019   Lung nodule 08/08/2019   Sciatica of left side  07/21/2019   Precordial pain 05/09/2013   Hyperlipidemia 04/21/2013   Prediabetes 04/21/2013   GERD (gastroesophageal reflux disease) 04/21/2013   Hypothyroidism 04/21/2013   Bowel habit changes 08/30/2011   Gas 08/30/2011   Right lower quadrant abdominal pain 08/30/2011    ONSET DATE: 12/2021  REFERRING DIAG: right RC tendonitis  THERAPY DIAG:  Chronic right shoulder pain  Other symptoms and signs involving the musculoskeletal system  Rationale for Evaluation and Treatment: Rehabilitation  SUBJECTIVE:   SUBJECTIVE STATEMENT: S: I'm not sure what I did to the shoulder.  Pt accompanied by: self  PERTINENT HISTORY: Pt is a 82 y/o female presenting with right shoulder pain present since approximately November 2023, X-Ray completed with no abnormalities. She has had 2 cortisone shots. She had a similar issue years ago that resolved with exercises.   PRECAUTIONS: None  WEIGHT BEARING RESTRICTIONS: No  PAIN:  Are you having pain? Yes: NPRS scale: 2/10 Pain location: right shoulder Pain description: sore Aggravating factors: unsure Relieving factors: rest  FALLS: Has patient fallen in last 6 months? No  PLOF: Independent  PATIENT GOALS: To have less pain in the right shoulder.   NEXT MD VISIT: None scheduled  OBJECTIVE:   HAND DOMINANCE: Right  ADLs: Overall ADLs: Pt reports she has some pain with  most ADLs and with lifting tasks. She can complete the tasks but has pain, notably with vacuuming and heavy housekeeping.    FUNCTIONAL OUTCOME MEASURES: FOTO: 59/100  UPPER EXTREMITY ROM:     Assessed seated, er/IR adducted  Active ROM Right eval  Shoulder flexion 131  Shoulder abduction 123  Shoulder internal rotation 90  Shoulder external rotation 55  (Blank rows = not tested)   UPPER EXTREMITY MMT:       Assessed seated, er/IR adducted  MMT Right eval  Shoulder flexion 5/5  Shoulder abduction 4+/5  Shoulder internal rotation 5/5  Shoulder  external rotation 5/5  (Blank rows = not tested)  COGNITION: Overall cognitive status: Within functional limits for tasks assessed  OBSERVATIONS: Min fascial restrictions in the upper arm, anterior shoulder, trapezius, and scapular regions   TODAY'S TREATMENT:                                                                                                                              DATE: N/A-eval only     PATIENT EDUCATION: Education details: shoulder stretches Person educated: Patient Education method: Explanation, Demonstration, and Handouts Education comprehension: verbalized understanding and returned demonstration  HOME EXERCISE PROGRAM: Eval: shoulder stretches  GOALS: Goals reviewed with patient? Yes  SHORT TERM GOALS: Target date: 04/27/2022   Pt will be provided with and educated on HEP to improve mobility in RUE required for use during ADL completion.   Goal status: INITIAL  2. Pt will increase RUE PA/ROM by at least 10 degrees to improve ability to use RUE during dressing and bathing tasks with minimal compensatory techniques.   Goal status: INITIAL  3.  Pt will decrease RUE pain to 3/10 or less to improve ability to perform housekeeping tasks with minimal soreness or pain.    Goal status: INITIAL  4.  Pt will decrease RUE fascial restrictions to trace amounts to improve functional reaching required for getting items out of or putting items in overhead cabinets.  :  Goal status: INITIAL      ASSESSMENT:  CLINICAL IMPRESSION: Patient is a 82 y.o. female who was seen today for occupational therapy evaluation for right shoulder pain that began approximately 3 months ago. Pt demonstrating RUE A/ROM slightly below LUE ROM, strength is good throughout RUE however has pain during MMT. Pt reports she is able to complete her ADLs however experiences pain and soreness with completion. Would like to try HEP and come in for a follow up and updated HEP in 2 weeks.     PERFORMANCE DEFICITS: in functional skills including ADLs, IADLs, ROM, strength, pain, fascial restrictions, and UE functional use  IMPAIRMENTS: are limiting patient from ADLs, IADLs, rest and sleep, and leisure.   COMORBIDITIES: has no other co-morbidities that affects occupational performance. Patient will benefit from skilled OT to address above impairments and improve overall function.  MODIFICATION OR ASSISTANCE TO COMPLETE EVALUATION: No modification of tasks or assist necessary to  complete an evaluation.  OT OCCUPATIONAL PROFILE AND HISTORY: Problem focused assessment: Including review of records relating to presenting problem.  CLINICAL DECISION MAKING: LOW - limited treatment options, no task modification necessary  REHAB POTENTIAL: Good  EVALUATION COMPLEXITY: Low      PLAN:  OT FREQUENCY: every other week  OT DURATION: 4 weeks  PLANNED INTERVENTIONS: self care/ADL training, therapeutic exercise, therapeutic activity, manual therapy, passive range of motion, patient/family education, and DME and/or AE instructions  RECOMMENDED OTHER SERVICES: N/A  CONSULTED AND AGREED WITH PLAN OF CARE: Patient  PLAN FOR NEXT SESSION: Reassess and follow up on HEP, manual techniques if needed, A/ROM and update HEP; determine if additional visits needed.    Guadelupe Sabin, OTR/L  [phone] 03/29/2022, 10:24 AM

## 2022-03-29 NOTE — Patient Instructions (Signed)
  1) Flexion Wall Stretch    Face wall, place affected handon wall in front of you. Slide hand up the wall  and lean body in towards the wall. Hold for 10 seconds. Repeat 3-5 times. 1-2 times/day.     2) Towel Stretch with Internal Rotation     Gently pull up (or to the side) your affected arm  behind your back with the assist of a towel. Hold 10 seconds, repeat 3-5 times. 1-2 times/day.             3) Corner Stretch    Stand at a corner of a wall, place your arms on the walls with elbows bent. Lean into the corner until a stretch is felt along the front of your chest and/or shoulders. Hold for 10 seconds. Repeat 3-5X, 1-2 times/day.    4) Posterior Capsule Stretch    Bring the involved arm across chest. Grasp elbow and pull toward chest until you feel a stretch in the back of the upper arm and shoulder. Hold 10 seconds. Repeat 3-5X. Complete 1-2 times/day.    5) Scapular Retraction    Tuck chin back as you pinch shoulder blades together.  Hold 5 seconds. Repeat 3-5X. Complete 1-2 times/day.

## 2022-04-12 ENCOUNTER — Encounter (HOSPITAL_COMMUNITY): Payer: Self-pay | Admitting: Occupational Therapy

## 2022-04-12 ENCOUNTER — Ambulatory Visit (HOSPITAL_COMMUNITY): Payer: PPO | Admitting: Occupational Therapy

## 2022-04-12 DIAGNOSIS — M25511 Pain in right shoulder: Secondary | ICD-10-CM | POA: Diagnosis not present

## 2022-04-12 DIAGNOSIS — R29898 Other symptoms and signs involving the musculoskeletal system: Secondary | ICD-10-CM

## 2022-04-12 DIAGNOSIS — G8929 Other chronic pain: Secondary | ICD-10-CM

## 2022-04-12 NOTE — Therapy (Signed)
OUTPATIENT OCCUPATIONAL THERAPY ORTHO REASSESSMENT & TREATMENT DISCHARGE SUMMARY  Patient Name: Jordan Grant MRN: [id number] DOB:06-Aug-1940, 82 y.o., female Today's Date: 04/12/2022  PCP: Dr. Sallee Lange REFERRING PROVIDER: Dr. Larena Glassman  END OF SESSION:  OT End of Session - 04/12/22 1053     Visit Number 2    Number of Visits 3    Date for OT Re-Evaluation 04/28/22    Authorization Type [employer]    Progress Note Due on Visit 10    OT Start Time 0947    OT Stop Time 1025    OT Time Calculation (min) 38 min    Activity Tolerance Patient tolerated treatment well    Behavior During Therapy [facility] for tasks assessed/performed              Past Medical History:  Diagnosis Date   Anxiety    Arthritis    hip   Cataract    hx of   Diverticulosis 2007   History of diverticula   Emphysema lung ([place]) 12/24/2019   GERD (gastroesophageal reflux disease)    Hyperlipidemia    Hypothyroidism    Prediabetes    Past Surgical History:  Procedure Laterality Date   APPENDECTOMY     CATARACT EXTRACTION W/ INTRAOCULAR LENS  IMPLANT, BILATERAL  12/09   Dr Corrin Parker bilateral   COLONOSCOPY     DOPPLER ECHOCARDIOGRAPHY  06/2008   Leg   HEMORROIDECTOMY     ML bone density  07/11   TUBAL LIGATION     YAG LASER APPLICATION Right [id number]   Procedure: YAG LASER APPLICATION;  Surgeon: Elta Guadeloupe T. Gershon Crane, MD;  Location: [facility];  Service: Ophthalmology;  Laterality: Right;   YAG LASER APPLICATION Left [id number]   Procedure: YAG LASER APPLICATION;  Surgeon: Elta Guadeloupe T. Gershon Crane, MD;  Location: [facility];  Service: Ophthalmology;  Laterality: Left;   Patient Active Problem List   Diagnosis Date Noted   Localized osteoporosis without current pathological fracture 08/17/2021   Myalgia due to statin 05/07/2020   Aortic atherosclerosis ([place]) 12/24/2019   Emphysema lung ([place]) 12/24/2019   CAP (community acquired pneumonia) 08/08/2019   Acute bronchitis 08/08/2019   Lung nodule  08/08/2019   Sciatica of left side 07/21/2019   Precordial pain 05/09/2013   Hyperlipidemia 04/21/2013   Prediabetes 04/21/2013   GERD (gastroesophageal reflux disease) 04/21/2013   Hypothyroidism 04/21/2013   Bowel habit changes 08/30/2011   Gas 08/30/2011   Right lower quadrant abdominal pain 08/30/2011    ONSET DATE: 12/2021  REFERRING DIAG: right RC tendonitis  THERAPY DIAG:  Chronic right shoulder pain  Other symptoms and signs involving the musculoskeletal system  Rationale for Evaluation and Treatment: Rehabilitation  SUBJECTIVE:   SUBJECTIVE STATEMENT: S: I can sleep through the night now.  PERTINENT HISTORY: Pt is a 82 y/o female presenting with right shoulder pain present since approximately November 2023, X-Ray completed with no abnormalities. She has had 2 cortisone shots. She had a similar issue years ago that resolved with exercises.   PRECAUTIONS: None  WEIGHT BEARING RESTRICTIONS: No  PAIN:  Are you having pain? Yes: NPRS scale: 1/10 Pain location: right shoulder Pain description: sore Aggravating factors: unsure Relieving factors: rest  FALLS: Has patient fallen in last 6 months? No  PLOF: Independent  PATIENT GOALS: To have less pain in the right shoulder.   NEXT MD VISIT: None scheduled  OBJECTIVE:   HAND DOMINANCE: Right  ADLs: Overall ADLs: Pt reports she has some pain with most ADLs  and with lifting tasks. She can complete the tasks but has pain, notably with vacuuming and heavy housekeeping.    FUNCTIONAL OUTCOME MEASURES: FOTO: 59/100 04/12/22: 63/100  UPPER EXTREMITY ROM:     Assessed seated, er/IR adducted  Active ROM Right eval Right 04/12/22  Shoulder flexion 131 144  Shoulder abduction 123 145  Shoulder internal rotation 90 90  Shoulder external rotation 55 60  (Blank rows = not tested)   UPPER EXTREMITY MMT:       Assessed seated, er/IR adducted  MMT Right eval Right 04/12/22  Shoulder flexion 5/5   Shoulder  abduction 4+/5 5/5  Shoulder internal rotation 5/5   Shoulder external rotation 5/5   (Blank rows = not tested)  OBSERVATIONS: Min fascial restrictions in the upper arm, anterior shoulder, trapezius, and scapular regions   TODAY'S TREATMENT:                                                                                                                              DATE:  04/12/22: -Myofascial release to right upper arm, anterior shoulder, trapezius, and scapular regions to decrease pain and fascial restrictions and increase joint ROM -P/ROM: supine-flexion, abduction, er/IR, horizontal abduction, 3 reps each -A/ROM: seated-protraction, flexion, abduction, er/IR, horizontal abduction, 10 reps -Scapular A/ROM: row, extension, retraction, 10 reps   PATIENT EDUCATION: Education details: shoulder A/ROM Person educated: Patient Education method: Consulting civil engineer, Demonstration, and Handouts Education comprehension: verbalized understanding and returned demonstration  HOME EXERCISE PROGRAM: Eval: shoulder stretches 04/12/22: shoulder A/ROM  GOALS: Goals reviewed with patient? Yes  SHORT TERM GOALS: Target date: 04/27/2022   Pt will be provided with and educated on HEP to improve mobility in RUE required for use during ADL completion.   Goal status: MET  2. Pt will increase RUE A/ROM by at least 10 degrees to improve ability to use RUE during dressing and bathing tasks with minimal compensatory techniques.   Goal status: MET  3.  Pt will decrease RUE pain to 3/10 or less to improve ability to perform housekeeping tasks with minimal soreness or pain.    Goal status: MET  4.  Pt will decrease RUE fascial restrictions to trace amounts to improve functional reaching required for getting items out of or putting items in overhead cabinets.  :  Goal status: NOT MET      ASSESSMENT:  CLINICAL IMPRESSION: Patient reports she can now sleep without waking due to pain and can even sleep  on her RUE. Reassessment completed this session, pt has improved both her ROM and her strength, continues to have fascial restrictions in her shoulder and trapezius regions. Completed myofascial release this session, as well as A/ROM and scapular A/ROM. Pt performing with good form, reports she is pleased with her function and her pain level. Pt agreeable to discharge with updated HEP.   PERFORMANCE DEFICITS: in functional skills including ADLs, IADLs, ROM, strength, pain, fascial restrictions, and UE functional use   PLAN:  OT FREQUENCY: every other week  OT DURATION: 4 weeks  PLANNED INTERVENTIONS: self care/ADL training, therapeutic exercise, therapeutic activity, manual therapy, passive range of motion, patient/family education, and DME and/or AE instructions  RECOMMENDED OTHER SERVICES: PT for right hip pain  CONSULTED AND AGREED WITH PLAN OF CARE: Patient  PLAN FOR NEXT SESSION: N/A-discharge pt   OCCUPATIONAL THERAPY DISCHARGE SUMMARY  Visits from Start of Care: 2  Current functional level related to goals / functional outcomes: See above. Pt now able to sleep on and off of her shoulder without waking due to pain, completes all ADLs and functional tasks with soreness but no difficulty in completion.    Remaining deficits: Continued soreness in her shoulder region with some lifting tasks   Education / Equipment: HEP   Patient agrees to discharge. Patient goals were not met. Patient is being discharged due to meeting the stated rehab goals.Guadelupe Sabin, OTR/L  [phone] 04/12/2022, 10:53 AM

## 2022-04-12 NOTE — Patient Instructions (Signed)
Repeat all exercises 10-15 times, 1-2 times per day.  1) Shoulder Protraction    Begin with elbows by your side, slowly "punch" straight out in front of you.      2) Shoulder Flexion  Standing:         Begin with arms at your side with thumbs pointed up, slowly raise both arms up and forward towards overhead.               3) Horizontal abduction/adduction  Standing:           Begin with arms straight out in front of you, bring out to the side in at "T" shape. Keep arms straight entire time.                 4) Internal & External Rotation  Standing:     Stand with elbows at the side and elbows bent 90 degrees. Move your forearms away from your body, then bring back inward toward the body.     5) Shoulder Abduction  Standing:       Lying on your back begin with your arms flat on the table next to your side. Slowly move your arms out to the side so that they go overhead, in a jumping jack or snow angel movement.   6) Seated Row   Sit up straight with elbows by your sides. Pull back with shoulders/elbows, keeping forearms straight, as if pulling back on the reins of a horse. Squeeze shoulder blades together. Repeat _10-15__times, __2-3__sets/day    7) Shoulder Elevation    Sit up straight with arms by your sides. Slowly bring your shoulders up towards your ears. Repeat_10-15__times, __2-3__ sets/day    8) Shoulder Extension    Sit up straight with both arms by your side, draw your arms back behind your waist. Keep your elbows straight. Repeat __10-15__times, __2-3__sets/day.

## 2022-04-13 ENCOUNTER — Encounter: Payer: Self-pay | Admitting: Radiology

## 2022-05-01 ENCOUNTER — Telehealth: Payer: Self-pay

## 2022-05-01 NOTE — Telephone Encounter (Signed)
She may hold taking Fosamax until she is seen If she limping?  Severe pain in the hip?  Severe pain mid thigh?

## 2022-05-01 NOTE — Telephone Encounter (Signed)
Pt is calling I have made her appt for next Wed she is having hip and leg pain she thinks it may be coming from the medication that she was put on for alendronate (FOSAMAX) 70 MG tablet   Jordan Grant [phone]

## 2022-05-01 NOTE — Telephone Encounter (Signed)
Nurses recommend that the patient get stat hip x-ray and femur x-ray of the affected leg.  Stat preferably in the morning.  At the latest.  Set her up for a follow-up visit Wednesday afternoon at 4 PM.  But please have her do her x-rays tomorrow morning.  Hold off on doing any extra walking until she is seen.  Please let her know that we should be able to get the results relatively quickly

## 2022-05-01 NOTE — Telephone Encounter (Signed)
Patient stated it started last week and has gotten really bad like a throbbing toothache in her leg going into groin- she  is limping now due to the pain She has been walking 4 minutes every day but is not being able to- her sister told her it could be from all the walking she is going also and arthritis

## 2022-05-02 ENCOUNTER — Other Ambulatory Visit: Payer: Self-pay

## 2022-05-02 DIAGNOSIS — R1031 Right lower quadrant pain: Secondary | ICD-10-CM

## 2022-05-02 NOTE — Telephone Encounter (Signed)
Xrays ordered and patient appointment scheduled for tomorrow at 2:30pm

## 2022-05-02 NOTE — Progress Notes (Signed)
  Patient advised per Dr Nicki Reaper recommend that the patient get stat hip x-ray and femur x-ray of the affected leg.  Stat preferably in the morning.  At the latest.  Set her up for a follow-up visit Wednesday afternoon at 4 PM.  But please have her do her x-rays tomorrow morning.  Hold off on doing any extra walking until she is seen.  Please let her know that we should be able to get the results relatively quickly. Patient verbalized understanding.    Appointment scheduled for 2:30 pm per patient preference. Xray orders placed per request.

## 2022-05-03 ENCOUNTER — Other Ambulatory Visit: Payer: Self-pay

## 2022-05-03 ENCOUNTER — Ambulatory Visit (INDEPENDENT_AMBULATORY_CARE_PROVIDER_SITE_OTHER): Payer: PPO | Admitting: Family Medicine

## 2022-05-03 ENCOUNTER — Ambulatory Visit (HOSPITAL_COMMUNITY)
Admission: RE | Admit: 2022-05-03 | Discharge: 2022-05-03 | Disposition: A | Discharge status: Home / Self Care | Payer: PPO | Source: Ambulatory Visit | Attending: Family Medicine | Admitting: Family Medicine

## 2022-05-03 VITALS — BP 118/74 | Ht 61.0 in | Wt 136.4 lb

## 2022-05-03 DIAGNOSIS — M160 Bilateral primary osteoarthritis of hip: Secondary | ICD-10-CM | POA: Diagnosis not present

## 2022-05-03 DIAGNOSIS — R1031 Right lower quadrant pain: Secondary | ICD-10-CM | POA: Insufficient documentation

## 2022-05-03 NOTE — Progress Notes (Signed)
   Subjective:    Patient ID: Jordan Grant, female    DOB: 25-Jun-1940, 82 y.o.   MRN: [id number]  HPI  Patient arrives to discuss hip/leg pain for a week. Patient states she woke up this morning and has no pain. Patient states she feels like the pain is coming from washing windows last week- climbing up on ladder  She relates a lot of right hip and femur pain she was worried that this could be related to the Fosamax  Review of Systems     Objective:   Physical Exam General-in no acute distress Eyes-no discharge Lungs-respiratory rate normal, CTA CV-no murmurs,RRR Extremities skin warm dry no edema Neuro grossly normal Behavior normal, alert Patient is able to move both legs well without any hitch She is able to walk without having any type of issues        Assessment & Plan:   X-rays but were negative 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  Continue Fosamax.  Next visit lab work

## 2022-05-05 ENCOUNTER — Other Ambulatory Visit: Payer: Self-pay | Admitting: *Deleted

## 2022-05-05 DIAGNOSIS — E785 Hyperlipidemia, unspecified: Secondary | ICD-10-CM

## 2022-05-05 DIAGNOSIS — E038 Other specified hypothyroidism: Secondary | ICD-10-CM

## 2022-05-05 DIAGNOSIS — R7303 Prediabetes: Secondary | ICD-10-CM

## 2022-05-05 DIAGNOSIS — Z79899 Other long term (current) drug therapy: Secondary | ICD-10-CM

## 2022-05-10 ENCOUNTER — Ambulatory Visit: Payer: PPO | Admitting: Family Medicine

## 2022-06-09 ENCOUNTER — Other Ambulatory Visit: Payer: Self-pay | Admitting: Family Medicine

## 2022-06-15 ENCOUNTER — Encounter: Payer: Self-pay | Admitting: Family Medicine

## 2022-06-15 ENCOUNTER — Ambulatory Visit (INDEPENDENT_AMBULATORY_CARE_PROVIDER_SITE_OTHER): Payer: PPO | Admitting: Family Medicine

## 2022-06-15 VITALS — BP 118/68 | HR 76 | Ht 61.0 in | Wt 135.4 lb

## 2022-06-15 DIAGNOSIS — R1031 Right lower quadrant pain: Secondary | ICD-10-CM | POA: Diagnosis not present

## 2022-06-15 DIAGNOSIS — I700 Atherosclerosis of aorta: Secondary | ICD-10-CM | POA: Diagnosis not present

## 2022-06-15 DIAGNOSIS — J432 Centrilobular emphysema: Secondary | ICD-10-CM

## 2022-06-15 DIAGNOSIS — Z0001 Encounter for general adult medical examination with abnormal findings: Secondary | ICD-10-CM

## 2022-06-15 DIAGNOSIS — Z0000 Encounter for general adult medical examination without abnormal findings: Secondary | ICD-10-CM

## 2022-06-15 NOTE — Progress Notes (Signed)
Subjective:    Patient ID: Jordan Grant, female    DOB: [birth year], 82 y.o.   MRN: [id number]  HPI AWV- Annual Wellness Visit  The patient was seen for their annual wellness visit. The patient's past medical history, surgical history, and family history were reviewed. Pertinent vaccines were reviewed ( tetanus, pneumonia, shingles, flu) The patient's medication list was reviewed and updated.  The height and weight were entered.  BMI recorded in electronic record elsewhere  Cognitive screening was completed. Outcome of Mini - Cog: 5   Falls /depression screening electronically recorded within record elsewhere  Current tobacco usage:none (All patients who use tobacco were given written and verbal information on quitting)  Recent listing of emergency department/hospitalizations over the past year were reviewed.  current specialist the patient sees on a regular basis: no   Medicare annual wellness visit patient questionnaire was reviewed.  A written screening schedule for the patient for the next 5-10 years was given. Appropriate discussion of followup regarding next visit was discussed.  We did review immunizations We also reviewed healthy eating Her weight is acceptable She does walking but she has significant right leg pain and discomfort it wakes her up at night is in the right hip that radiates down the leg on the outer aspect she denies buttock or back pain.  She had x-rays that did show some hip arthritis.  She states during the day when she walks she really does not notice a whole lot of trouble but if she does a lot of activity or squats it does give her pain and discomfort in the hip    Review of Systems     Objective:   Physical Exam General-in no acute distress Eyes-no discharge Lungs-respiratory rate normal, CTA CV-no murmurs,RRR Extremities skin warm dry no edema Neuro grossly normal Behavior normal, alert  Outpatient Encounter Medications as of  06/15/2022  Medication Sig   alendronate (FOSAMAX) 70 MG tablet Take 1 tablet (70 mg total) by mouth every 7 (seven) days. Take with a full glass of water on an empty stomach.   Lactase (DAIRY DIGESTIVE PO) Take by mouth.   levothyroxine (SYNTHROID) 75 MCG tablet TAKE 1 TABLET(75 MCG) BY MOUTH DAILY   omeprazole (PRILOSEC) 40 MG capsule TAKE 1 CAPSULE BY MOUTH EVERY DAY   OVER THE COUNTER MEDICATION Calcium chews   Polyethyl Glycol-Propyl Glycol (SYSTANE OP) Apply 1 drop to eye daily.    rosuvastatin (CRESTOR) 10 MG tablet Take one tablet po daily   traMADol (ULTRAM) 50 MG tablet TAKE 1/2 TABLET EVERY MORNING AND MID AFTERNOON THEN 1 TABLET EVERY EVENING.   No facility-administered encounter medications on file as of 06/15/2022.    Prevent measures reviewed in detail     Assessment & Plan:  1. Encounter for subsequent annual wellness visit (AWV) in Medicare patient Adult wellness-complete.wellness physical was conducted today. Importance of diet and exercise were discussed in detail.  Importance of stress reduction and healthy living were discussed.  In addition to this a discussion regarding safety was also covered.  We also reviewed over immunizations and gave recommendations regarding current immunization needed for age.   In addition to this additional areas were also touched on including: Preventative health exams needed:  Colonoscopy not indicated  Patient was advised yearly wellness exam   2. Severe right groin pain We will go ahead with referral to physiatrist for further evaluation.  It is quite possible that the patient has some impingement of the nerve in her  back causing this but it could be her hip  Emphysema seen on CT scan back in 2021 but patient does not have any shortness of breath associated with this Also aortic atherosclerosis seen at that time patient is on a statin  Patient will do lab work in the near future She will do a follow-up visit later toward fall  time We did advise getting RSV as well as flu and COVID by fall time

## 2022-06-18 ENCOUNTER — Telehealth: Payer: Self-pay | Admitting: Family Medicine

## 2022-06-18 DIAGNOSIS — R1031 Right lower quadrant pain: Secondary | ICD-10-CM

## 2022-06-18 NOTE — Telephone Encounter (Signed)
Nurses Please help with referral for patient to see physiatrist with [place] Dr.Krutika Raulkar-this doctor can help figure out what is causing her leg pain at nighttime and the best approach to handle this  Please go ahead with referral Patient is aware that we are setting up a referral you may send her a MyChart message letting her know that we have sent the information and she should expect a phone call from them regarding her appointment Or you can call her with the above thank you

## 2022-06-27 ENCOUNTER — Encounter: Payer: Self-pay | Admitting: *Deleted

## 2022-06-27 NOTE — Telephone Encounter (Signed)
Referral ordered in EPIC. Patient notified via my chart

## 2022-07-05 ENCOUNTER — Encounter: Payer: Self-pay | Admitting: Physical Medicine and Rehabilitation

## 2022-07-11 ENCOUNTER — Telehealth: Payer: Self-pay

## 2022-07-11 MED ORDER — ALENDRONATE SODIUM 70 MG PO TABS: 70.0000 mg | ORAL_TABLET | ORAL | 11 refills | Status: DC

## 2022-07-11 NOTE — Telephone Encounter (Signed)
Prescription Request  07/11/2022  LOV: Visit date not found  What is the name of the medication or equipment? alendronate (FOSAMAX) 70 MG tablet   Have you contacted your pharmacy to request a refill? Yes   Which pharmacy would you like this sent to?  [facility]   Patient notified that their request is being sent to the clinical staff for review and that they should receive a response within 2 business days.   Please advise at Mobile [phone] (mobile)

## 2022-07-18 ENCOUNTER — Encounter: Payer: Self-pay | Admitting: Family Medicine

## 2022-07-18 ENCOUNTER — Ambulatory Visit (INDEPENDENT_AMBULATORY_CARE_PROVIDER_SITE_OTHER): Payer: PPO | Admitting: Family Medicine

## 2022-07-18 VITALS — BP 118/75 | HR 78 | Temp 97.5°F | Ht 61.0 in | Wt 135.0 lb

## 2022-07-18 DIAGNOSIS — H109 Unspecified conjunctivitis: Secondary | ICD-10-CM | POA: Insufficient documentation

## 2022-07-18 DIAGNOSIS — H1031 Unspecified acute conjunctivitis, right eye: Secondary | ICD-10-CM

## 2022-07-18 MED ORDER — MOXIFLOXACIN HCL 0.5 % OP SOLN: 1.0000 [drp] | Freq: Three times a day (TID) | OPHTHALMIC | 0 refills | Status: AC

## 2022-07-18 MED ORDER — AZELASTINE HCL 0.05 % OP SOLN: 1.0000 [drp] | Freq: Two times a day (BID) | OPHTHALMIC | 12 refills | Status: DC

## 2022-07-18 NOTE — Patient Instructions (Signed)
Medications as prescribed.  Call the eye doctors office and see if they will see you sooner.  Call with concerns.  Take care  Dr. Adriana Simas

## 2022-07-18 NOTE — Progress Notes (Signed)
Subjective:  Patient ID: Jordan Grant, female    DOB: November 03, 1940  Age: 82 y.o. MRN: [id number]  CC: Chief Complaint  Patient presents with   red painfull eyes    Light sensitivity, blurry vision R eye - using warm compresses and systane drops    HPI:  82 year old female presents for evaluation of the above.  1 week history of right eye redness, irritation. No purulent discharge. Eye has been watering. Mild itching. Has now developed runny nose as well. No relief with OTC drops and warm compresses. Has been unable to see Ophthalmology.   Patient Active Problem List   Diagnosis Date Noted   Conjunctivitis 07/18/2022   Localized osteoporosis without current pathological fracture 08/17/2021   Myalgia due to statin 05/07/2020   Aortic atherosclerosis (HCC) 12/24/2019   Emphysema lung (HCC) 12/24/2019   Lung nodule 08/08/2019   Sciatica of left side 07/21/2019   Hyperlipidemia 04/21/2013   Prediabetes 04/21/2013   GERD (gastroesophageal reflux disease) 04/21/2013   Hypothyroidism 04/21/2013    Social Hx   Social History   Socioeconomic History   Marital status: Married    Spouse name: Rosanne Ashing   Number of children: 2   Years of education: Not on file   Highest education level: Not on file  Occupational History   Not on file  Tobacco Use   Smoking status: Former    Types: Cigarettes    Quit date: 04/24/1983    Years since quitting: 39.2   Smokeless tobacco: Never  Substance and Sexual Activity   Alcohol use: Yes    Alcohol/week: 11.0 standard drinks of alcohol    Types: 4 Glasses of wine, 7 Standard drinks or equivalent per week   Drug use: No   Sexual activity: Not Currently    Comment: married, same sexual partner, but not active  Other Topics Concern   Not on file  Social History Narrative   Lives with husband   4 grandchildren and 1 great grandchild.    Social Determinants of Health   Financial Resource Strain: Low Risk  (11/02/2020)   Overall Financial  Resource Strain (CARDIA)    Difficulty of Paying Living Expenses: Not hard at all  Food Insecurity: No Food Insecurity (11/02/2020)   Hunger Vital Sign    Worried About Running Out of Food in the Last Year: Never true    Ran Out of Food in the Last Year: Never true  Transportation Needs: No Transportation Needs (11/02/2020)   PRAPARE - Administrator, Civil Service (Medical): No    Lack of Transportation (Non-Medical): No  Physical Activity: Sufficiently Active (11/02/2020)   Exercise Vital Sign    Days of Exercise per Week: 5 days    Minutes of Exercise per Session: 50 min  Stress: No Stress Concern Present (11/02/2020)   [employer] of Occupational Health - Occupational Stress Questionnaire    Feeling of Stress : Not at all  Social Connections: Socially Integrated (11/02/2020)   Social Connection and Isolation Panel [NHANES]    Frequency of Communication with Friends and Family: More than three times a week    Frequency of Social Gatherings with Friends and Family: Once a week    Attends Religious Services: More than 4 times per year    Active Member of [employer] or Organizations: Yes    Attends Engineer, structural: More than 4 times per year    Marital Status: Married    Review of Systems Per  HPI  Objective:  BP 118/75   Pulse 78   Temp (!) 97.5 F (36.4 C)   Ht 5\' 1"  (1.549 m)   Wt 135 lb (61.2 kg)   SpO2 95%   BMI 25.51 kg/m      07/18/2022    3:38 PM 06/15/2022   10:51 AM 06/15/2022    9:55 AM  BP/Weight  Systolic BP 118 118 126  Diastolic BP 75 68 75  Wt. (Lbs) 135  135.4  BMI 25.51 kg/m2  25.58 kg/m2    Physical Exam Constitutional:      General: She is not in acute distress.    Appearance: Normal appearance.  HENT:     Head: Normocephalic and atraumatic.     Nose: No rhinorrhea.  Eyes:     Comments: Right eye redness. No discharge. No appreciable foreign body.   Pulmonary:     Effort: Pulmonary effort is normal. No respiratory  distress.  Neurological:     Mental Status: She is alert.  Psychiatric:     Comments: Flat affect.      Lab Results  Component Value Date   WBC 7.4 08/07/2019   HGB 12.4 08/07/2019   HCT 38.6 08/07/2019   PLT 206 08/07/2019   GLUCOSE 107 (H) 01/13/2022   CHOL 151 01/13/2022   TRIG 75 01/13/2022   HDL 70 01/13/2022   LDLCALC 67 01/13/2022   ALT 19 01/13/2022   AST 22 01/13/2022   NA 139 01/13/2022   K 4.6 01/13/2022   CL 102 01/13/2022   CREATININE 0.75 01/13/2022   BUN 14 01/13/2022   CO2 25 01/13/2022   TSH 1.210 01/13/2022   INR 1.1 08/07/2019   HGBA1C 6.4 (H) 01/13/2022     Assessment & Plan:   Problem List Items Addressed This Visit       Other   Conjunctivitis - Primary    Suspected to be allergic in nature. Covering with Vigamox to ensure no bacterial component. Azelastine drops as prescribed.        Meds ordered this encounter  Medications   moxifloxacin (VIGAMOX) 0.5 % ophthalmic solution    Sig: Place 1 drop into the right eye 3 (three) times daily for 7 days.    Dispense:  3 mL    Refill:  0   azelastine (OPTIVAR) 0.05 % ophthalmic solution    Sig: Place 1 drop into the right eye 2 (two) times daily.    Dispense:  6 mL    Refill:  12    Follow-up:  Return if symptoms worsen or fail to improve.  Everlene Other DO [facility]

## 2022-07-18 NOTE — Assessment & Plan Note (Signed)
Suspected to be allergic in nature. Covering with Vigamox to ensure no bacterial component. Azelastine drops as prescribed.

## 2022-07-24 ENCOUNTER — Ambulatory Visit (INDEPENDENT_AMBULATORY_CARE_PROVIDER_SITE_OTHER): Payer: PPO | Admitting: Family Medicine

## 2022-07-24 VITALS — BP 142/88 | HR 86 | Wt 133.8 lb

## 2022-07-24 DIAGNOSIS — H5789 Other specified disorders of eye and adnexa: Secondary | ICD-10-CM

## 2022-07-24 NOTE — Progress Notes (Signed)
   Subjective:    Patient ID: Jordan Grant, female    DOB: September 06, 1940, 82 y.o.   MRN: [id number]  HPI Patient arrives today to follow up on right eye.  Patient was seen by Dr. Adriana Simas recently for conjunctivitis She was utilizing the allergy drops along with Vigamox  Patient states over the past few days the eyes giving her more trouble and she relates over the weekend she could not see well out of the right eye and now today she states everything seems to be blurry in that eye but the other eye seems to be fine she denies any significant drainage from the eye She denies any fever  No swelling of the periorbital region No unusual rashes She does relate that the eye feels sore and stays red  Review of Systems     Objective:   Physical Exam Eardrums are normal neck no masses lungs are clear heart regular she does have subpar Red reflex on the right side versus normal Red reflex on the left there is redness of the sclera Pupil is narrow very difficult to see optic disc Eyeball feels soft does not appear enlarged.  I do not see any hyphema.  Patient complains of soreness on palpation of the eyeball there is no proptosis. EOMI.  Left eye vision is very good approximately 2020 on [place] Right eye patient unable to see top of chart  Patient was seen at approximately 4:10 PM Finished her evaluation closer to 430 Spoke with ophthalmology office a few minutes thereafter     Assessment & Plan:  Right eye redness with poor vision for past couple days Urgent referral to ophthalmology Spoke with [facility] ophthalmology They are able to get her into the office tomorrow morning at 1030-patient was informed where to go for this.  She will go in the morning for this. May be uveitis.  I doubt closed angle glaucoma. Needs urgent evaluation.

## 2022-07-25 DIAGNOSIS — H20011 Primary iridocyclitis, right eye: Secondary | ICD-10-CM | POA: Diagnosis not present

## 2022-07-25 LAB — HM DIABETES EYE EXAM

## 2022-07-26 DIAGNOSIS — H20011 Primary iridocyclitis, right eye: Secondary | ICD-10-CM | POA: Diagnosis not present

## 2022-07-27 DIAGNOSIS — H20011 Primary iridocyclitis, right eye: Secondary | ICD-10-CM | POA: Diagnosis not present

## 2022-07-27 DIAGNOSIS — H3582 Retinal ischemia: Secondary | ICD-10-CM | POA: Diagnosis not present

## 2022-07-27 DIAGNOSIS — H35351 Cystoid macular degeneration, right eye: Secondary | ICD-10-CM | POA: Diagnosis not present

## 2022-07-31 ENCOUNTER — Encounter: Payer: Self-pay | Admitting: Family Medicine

## 2022-07-31 ENCOUNTER — Telehealth: Payer: Self-pay | Admitting: Family Medicine

## 2022-07-31 ENCOUNTER — Other Ambulatory Visit: Payer: Self-pay

## 2022-07-31 DIAGNOSIS — H3582 Retinal ischemia: Secondary | ICD-10-CM

## 2022-07-31 NOTE — Telephone Encounter (Signed)
Nurses Patient recently had significant troubles with vision Patient was seen by Dr. Fawn Kirk ophthalmologist They recommended a urgent MRA of the head without contrast (This was confirmed with the radiologist) This will help to look at the internal carotid artery as well as supply to the eye to make sure that there is no buildup blockages or ischemia. Diagnosis ocular ischemic syndrome right eye  (Back story information patient presented with a red eye and decreased vision along with decreased red reflex.  Unable to see the optic disc because of so many blood cells within the vitreous of the eye and the anterior chamber.  Was seen by [place].  Referred to ophthalmology-Dr. Mia Creek who then referred him to Dr. Luciana Axe who recommended this test.)  This test is recommended to be done ASAP.  Ideally it needs to be done this week.  Please initiate urgent request for this test and to have referral specialist work on getting it approved  I have already been in contact with the patient last week by phone we will send him a MyChart message letting them know that this is been initiated.  If any difficulty getting this approved please let me know.  Thanks-Dr. Lorin Picket

## 2022-07-31 NOTE — Telephone Encounter (Signed)
Patient has been made aware per drs notes and recommendations. Orders placed

## 2022-08-01 ENCOUNTER — Ambulatory Visit (HOSPITAL_COMMUNITY)
Admission: RE | Admit: 2022-08-01 | Discharge: 2022-08-01 | Disposition: A | Discharge status: Home / Self Care | Payer: PPO | Source: Ambulatory Visit | Attending: Family Medicine | Admitting: Family Medicine

## 2022-08-01 DIAGNOSIS — H47011 Ischemic optic neuropathy, right eye: Secondary | ICD-10-CM | POA: Diagnosis not present

## 2022-08-01 DIAGNOSIS — H3582 Retinal ischemia: Secondary | ICD-10-CM | POA: Diagnosis not present

## 2022-08-02 ENCOUNTER — Ambulatory Visit: Payer: PPO | Admitting: Family Medicine

## 2022-08-03 DIAGNOSIS — H35351 Cystoid macular degeneration, right eye: Secondary | ICD-10-CM | POA: Diagnosis not present

## 2022-08-03 DIAGNOSIS — H3582 Retinal ischemia: Secondary | ICD-10-CM | POA: Diagnosis not present

## 2022-08-03 DIAGNOSIS — Z961 Presence of intraocular lens: Secondary | ICD-10-CM | POA: Diagnosis not present

## 2022-08-03 DIAGNOSIS — H20011 Primary iridocyclitis, right eye: Secondary | ICD-10-CM | POA: Diagnosis not present

## 2022-08-10 ENCOUNTER — Encounter: Payer: Self-pay | Admitting: *Deleted

## 2022-08-13 ENCOUNTER — Encounter: Payer: Self-pay | Admitting: Family Medicine

## 2022-08-13 DIAGNOSIS — H35359 Cystoid macular degeneration, unspecified eye: Secondary | ICD-10-CM | POA: Insufficient documentation

## 2022-08-14 DIAGNOSIS — H35061 Retinal vasculitis, right eye: Secondary | ICD-10-CM | POA: Diagnosis not present

## 2022-08-14 DIAGNOSIS — H3582 Retinal ischemia: Secondary | ICD-10-CM | POA: Diagnosis not present

## 2022-08-14 DIAGNOSIS — H35351 Cystoid macular degeneration, right eye: Secondary | ICD-10-CM | POA: Diagnosis not present

## 2022-08-15 ENCOUNTER — Encounter: Payer: Self-pay | Admitting: Ophthalmology

## 2022-08-15 ENCOUNTER — Telehealth: Payer: Self-pay | Admitting: Family Medicine

## 2022-08-15 ENCOUNTER — Other Ambulatory Visit (INDEPENDENT_AMBULATORY_CARE_PROVIDER_SITE_OTHER): Payer: Self-pay | Admitting: Ophthalmology

## 2022-08-15 DIAGNOSIS — H35061 Retinal vasculitis, right eye: Secondary | ICD-10-CM | POA: Diagnosis not present

## 2022-08-15 NOTE — Telephone Encounter (Signed)
Nurses-Dr. 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 Audry Riles When the results come in we will share the results with her as well as Dr. Luciana Axe Dr. Rankin's fax number [phone] for future reference His office number [phone]

## 2022-08-15 NOTE — Telephone Encounter (Signed)
Patient dropped off GCA study from Dr. Sena Slate . He wanting her to get these labs done and wanting you to have a copy of what he ordered. In your yellow folder.

## 2022-08-16 ENCOUNTER — Other Ambulatory Visit: Payer: Self-pay | Admitting: Family Medicine

## 2022-08-16 LAB — CBC WITH DIFFERENTIAL/PLATELET
Basophils Absolute: 0.1 10*3/uL (ref 0.0–0.2)
Basos: 1 %
EOS (ABSOLUTE): 0.1 10*3/uL (ref 0.0–0.4)
Eos: 1 %
Hematocrit: 41.5 % (ref 34.0–46.6)
Hemoglobin: 13.3 g/dL (ref 11.1–15.9)
Immature Grans (Abs): 0 10*3/uL (ref 0.0–0.1)
Immature Granulocytes: 0 %
Lymphocytes Absolute: 1.9 10*3/uL (ref 0.7–3.1)
Lymphs: 25 %
MCH: 30.2 pg (ref 26.6–33.0)
MCHC: 32 g/dL (ref 31.5–35.7)
MCV: 94 fL (ref 79–97)
Monocytes Absolute: 0.6 10*3/uL (ref 0.1–0.9)
Monocytes: 8 %
Neutrophils Absolute: 4.9 10*3/uL (ref 1.4–7.0)
Neutrophils: 65 %
Platelets: 254 10*3/uL (ref 150–450)
RBC: 4.4 x10E6/uL (ref 3.77–5.28)
RDW: 12.3 % (ref 11.7–15.4)
WBC: 7.5 10*3/uL (ref 3.4–10.8)

## 2022-08-16 LAB — SEDIMENTATION RATE: Sed Rate: 6 mm/hr (ref 0–40)

## 2022-08-16 LAB — C-REACTIVE PROTEIN: CRP: <1 mg/L (ref 0–10)

## 2022-08-16 NOTE — Telephone Encounter (Signed)
Spoke with patient and she has already been to have some Labs done.

## 2022-08-21 NOTE — Telephone Encounter (Signed)
Should be noted that the eye specialist ordered lab tests and the results should be forwarded to them from [facility] without [place] having to send additional test results

## 2022-08-22 DIAGNOSIS — H3582 Retinal ischemia: Secondary | ICD-10-CM | POA: Diagnosis not present

## 2022-08-22 DIAGNOSIS — H34811 Central retinal vein occlusion, right eye, with macular edema: Secondary | ICD-10-CM | POA: Diagnosis not present

## 2022-08-22 DIAGNOSIS — H35061 Retinal vasculitis, right eye: Secondary | ICD-10-CM | POA: Diagnosis not present

## 2022-08-22 DIAGNOSIS — H35351 Cystoid macular degeneration, right eye: Secondary | ICD-10-CM | POA: Diagnosis not present

## 2022-08-22 DIAGNOSIS — H20011 Primary iridocyclitis, right eye: Secondary | ICD-10-CM | POA: Diagnosis not present

## 2022-08-31 ENCOUNTER — Other Ambulatory Visit: Payer: Self-pay | Admitting: Family Medicine

## 2022-08-31 ENCOUNTER — Encounter: Payer: PPO | Admitting: Physical Medicine and Rehabilitation

## 2022-09-01 ENCOUNTER — Other Ambulatory Visit: Payer: Self-pay | Admitting: Family Medicine

## 2022-09-19 DIAGNOSIS — H35351 Cystoid macular degeneration, right eye: Secondary | ICD-10-CM | POA: Diagnosis not present

## 2022-09-19 DIAGNOSIS — H34811 Central retinal vein occlusion, right eye, with macular edema: Secondary | ICD-10-CM | POA: Diagnosis not present

## 2022-09-19 DIAGNOSIS — H20011 Primary iridocyclitis, right eye: Secondary | ICD-10-CM | POA: Diagnosis not present

## 2022-09-19 DIAGNOSIS — H3582 Retinal ischemia: Secondary | ICD-10-CM | POA: Diagnosis not present

## 2022-09-19 LAB — HM DIABETES EYE EXAM

## 2022-09-28 ENCOUNTER — Encounter: Payer: Self-pay | Admitting: *Deleted

## 2022-10-17 ENCOUNTER — Ambulatory Visit: Payer: PPO | Admitting: Family Medicine

## 2022-10-24 DIAGNOSIS — H34811 Central retinal vein occlusion, right eye, with macular edema: Secondary | ICD-10-CM | POA: Diagnosis not present

## 2022-10-24 DIAGNOSIS — H35351 Cystoid macular degeneration, right eye: Secondary | ICD-10-CM | POA: Diagnosis not present

## 2022-10-24 DIAGNOSIS — H3582 Retinal ischemia: Secondary | ICD-10-CM | POA: Diagnosis not present

## 2022-10-24 DIAGNOSIS — H20011 Primary iridocyclitis, right eye: Secondary | ICD-10-CM | POA: Diagnosis not present

## 2022-10-31 DIAGNOSIS — R7303 Prediabetes: Secondary | ICD-10-CM | POA: Diagnosis not present

## 2022-10-31 DIAGNOSIS — E038 Other specified hypothyroidism: Secondary | ICD-10-CM | POA: Diagnosis not present

## 2022-10-31 DIAGNOSIS — E785 Hyperlipidemia, unspecified: Secondary | ICD-10-CM | POA: Diagnosis not present

## 2022-10-31 DIAGNOSIS — Z79899 Other long term (current) drug therapy: Secondary | ICD-10-CM | POA: Diagnosis not present

## 2022-11-07 ENCOUNTER — Ambulatory Visit: Payer: PPO | Admitting: Family Medicine

## 2022-11-07 ENCOUNTER — Encounter: Payer: Self-pay | Admitting: Family Medicine

## 2022-11-07 VITALS — BP 114/66 | HR 55 | Temp 97.3°F | Ht 61.0 in | Wt 134.0 lb

## 2022-11-07 DIAGNOSIS — Z23 Encounter for immunization: Secondary | ICD-10-CM | POA: Diagnosis not present

## 2022-11-07 DIAGNOSIS — E1169 Type 2 diabetes mellitus with other specified complication: Secondary | ICD-10-CM | POA: Diagnosis not present

## 2022-11-07 DIAGNOSIS — Z79899 Other long term (current) drug therapy: Secondary | ICD-10-CM | POA: Diagnosis not present

## 2022-11-07 DIAGNOSIS — E785 Hyperlipidemia, unspecified: Secondary | ICD-10-CM

## 2022-11-07 DIAGNOSIS — E119 Type 2 diabetes mellitus without complications: Secondary | ICD-10-CM

## 2022-11-07 NOTE — Progress Notes (Signed)
Subjective:    Patient ID: Jordan Grant, female    DOB: 15-Jul-1940, 82 y.o.   MRN: [id number]  HPI Discuss blood work results  Patient here for follow-up regarding cholesterol.    Patient relates taking medication on a regular basis Denies problems with medication Importance of dietary measures discussed Regular lab work regarding lipid and liver was checked and if needing additional labs was appropriately ordered  The patient was seen today as part of a comprehensive diabetic check up. Patient has diabetes Patient relates good compliance with taking the medication. We discussed their diet and exercise activities  We also discussed the importance of notifying us if any excessively high glucoses or low sugars.    She states she can do a better job with diet and activity.  And right eye vision update from specialist / and possible 2nd opinion she has had troubles with her right eye.  She was being seen by Dr. Luciana Axe.  They have tried some injections.  Her vision is not improving.  She is to get a second opinion and quite frankly is interested in changing physicians for eye care   Review of Systems     Objective:   Physical Exam General-in no acute distress Eyes-no discharge Lungs-respiratory rate normal, CTA CV-no murmurs,RRR Extremities skin warm dry no edema Neuro grossly normal Behavior normal, alert        Assessment & Plan:   1. Immunization due Flu shot today - Flu Vaccine Trivalent High Dose (Fluad)  2. Diabetes mellitus without complication (HCC) Diabetic care overall doing fairly well but her A1c has gone up she needs to minimize some of the carbohydrates and sweets she is having plus also recommended to having a regular physical activity with walking plus also watching her diet closely and repeating this again in 4 months if in 4 months her readings are still mildly elevated initiate medications - Hemoglobin A1c - Basic Metabolic Panel -  Microalbumin/Creatinine Ratio, Urine  3. Hyperlipidemia associated with type 2 diabetes mellitus (HCC) Continue statins importantly keep LDL below 70 - Lipid Panel  4. High risk medication use Liver function testing ordered because of medications - Hepatic Function Panel  For her visual issues we will reach out to Dr. Vanessa Barbara for evaluation and treatment.

## 2022-11-08 ENCOUNTER — Telehealth: Payer: Self-pay | Admitting: Family Medicine

## 2022-11-08 NOTE — Telephone Encounter (Signed)
-----   Message from Rennis Chris sent at 11/08/2022 12:37 PM EDT ----- Mervin Kung,  We'd be happy to see her and provide another opinion. Sounds like she has a pretty sick eye unfortunately. Feel free to send the referral, but I'll go ahead and have the front desk reach out to her to schedule an appointment.  Thanks for thinking of [place] for this patient,  Arlys John ----- Message ----- From: Babs Sciara, MD Sent: 11/08/2022   9:11 AM EDT To: Rennis Chris, MD  Cleone Slim Arlys John  A few months ago this patient started off with having a red right eye.  This became painful with poor vision she was referred to ophthalmology.  She recently has been seeing Dr. Fawn Kirk.  She was diagnosed with central retinal vein occlusion, retinal vasculitis, retinal ischemia.  She has not seen much progress in her vision.  She understands that this might not ever get better.  She is currently being treated with Avastin as well as consideration for other treatments.  She is a very nice patient who is very likable but just frustrated by her situation.  She is interested in being seen by yourself to see if she is on the right path or perhaps a different treatment regimen.  She would be very thankful if you are willing to take her on as a patient.  Her notes are within epic.  I realize it is not easy taking on patients who have already started some evaluation and treatment with other doctors.  Please let me know what you think and if you are willing to take her on as a patient if so I will send a referral.  Thank you for your input-[facility]

## 2022-11-08 NOTE — Telephone Encounter (Signed)
Nurses In regards to [place] I did communicate with Dr. Vanessa Barbara the eye specialist.  He stated that they would be willing to see her and will be reaching out to her to schedule an appointment Please put in the referral Please call Jordan Grant and let her know that we have the ball rolling on this and Dr. Laban Emperor office will be calling [place] for the referral retinal vein occlusion and ischemic retina

## 2022-11-09 ENCOUNTER — Other Ambulatory Visit: Payer: Self-pay

## 2022-11-09 DIAGNOSIS — H348192 Central retinal vein occlusion, unspecified eye, stable: Secondary | ICD-10-CM

## 2022-11-09 NOTE — Telephone Encounter (Signed)
Patient has been made aware and has been contacted for an appt .

## 2022-11-14 DIAGNOSIS — Z961 Presence of intraocular lens: Secondary | ICD-10-CM | POA: Diagnosis not present

## 2022-11-14 DIAGNOSIS — H0100A Unspecified blepharitis right eye, upper and lower eyelids: Secondary | ICD-10-CM | POA: Diagnosis not present

## 2022-11-14 DIAGNOSIS — E119 Type 2 diabetes mellitus without complications: Secondary | ICD-10-CM | POA: Diagnosis not present

## 2022-11-14 DIAGNOSIS — H04123 Dry eye syndrome of bilateral lacrimal glands: Secondary | ICD-10-CM | POA: Diagnosis not present

## 2022-11-14 DIAGNOSIS — H52203 Unspecified astigmatism, bilateral: Secondary | ICD-10-CM | POA: Diagnosis not present

## 2022-11-14 DIAGNOSIS — H0100B Unspecified blepharitis left eye, upper and lower eyelids: Secondary | ICD-10-CM | POA: Diagnosis not present

## 2022-11-14 LAB — HM DIABETES EYE EXAM

## 2022-11-14 NOTE — Progress Notes (Signed)
[facility] - Clinic Note  11/20/2022     CHIEF COMPLAINT Patient presents for Retina Evaluation   HISTORY OF PRESENT ILLNESS: Jordan Grant is a 82 y.o. female who presents to the clinic today for:   HPI     Retina Evaluation   In both eyes.  This started 1 month ago.  Duration of 1 month.  Associated Symptoms Floaters.  I, the attending physician,  performed the HPI with the patient and updated documentation appropriately.        Comments   Retina eval CRVO OD pt is reporting she has been seeing Dr Luciana Axe for CRVO and having IVA injection has had 3 she was getting ready to switch to I'VE pt is reporting some floaters but denies any flashes she feels like her vision is little better pt just recently found out she is diabetic last week her A1C is 6.6 she is not currently on any treatment diet control       Last edited by Rennis Chris, MD on 11/20/2022  6:30 PM.    Patient states that she has been treated by Dr. Luciana Axe. She has had injections with him.   Referring physician: Babs Sciara, MD [address] [place],  [place] [address]  HISTORICAL INFORMATION:   Selected notes from the MEDICAL RECORD NUMBER Referred by Dr. Gerda Diss LEE: Dr. Luciana Axe 9.10.24 - IVA OD #3 Ocular Hx- CRVO w/ CME OD -- s/p retrobulbar kenalog x1, IVA OD x3 PMH-    CURRENT MEDICATIONS: Current Outpatient Medications (Ophthalmic Drugs)  Medication Sig   Bromfenac Sodium 0.07 % SOLN Apply 1 drop to eye in the morning, at noon, in the evening, and at bedtime.   prednisoLONE acetate (PRED FORTE) 1 % ophthalmic suspension Place 1 drop into the right eye 4 (four) times daily.   azelastine (OPTIVAR) 0.05 % ophthalmic solution Place 1 drop into the right eye 2 (two) times daily.   Polyethyl Glycol-Propyl Glycol (SYSTANE OP) Apply 1 drop to eye daily.    No current facility-administered medications for this visit. (Ophthalmic Drugs)   Current Outpatient Medications  (Other)  Medication Sig   alendronate (FOSAMAX) 70 MG tablet Take 1 tablet (70 mg total) by mouth every 7 (seven) days. Take with a full glass of water on an empty stomach.   Lactase (DAIRY DIGESTIVE PO) Take by mouth.   levothyroxine (SYNTHROID) 75 MCG tablet TAKE 1 TABLET BY MOUTH EVERY DAY   omeprazole (PRILOSEC) 40 MG capsule TAKE 1 CAPSULE BY MOUTH EVERY DAY   OVER THE COUNTER MEDICATION Calcium chews   rosuvastatin (CRESTOR) 10 MG tablet TAKE 1 TABLET BY MOUTH DAILY   traMADol (ULTRAM) 50 MG tablet TAKE 1/2 TABLET BY MOUTH IN THE MORNING AND MID AFTERNOON THEN TAKE 1 TABLET BY MOUTH EVERY EVENING   No current facility-administered medications for this visit. (Other)   REVIEW OF SYSTEMS: ROS   Positive for: Endocrine, Eyes Last edited by Etheleen Mayhew, COT on 11/20/2022  8:51 AM.     ALLERGIES Allergies  Allergen Reactions   Codeine     Nausea, dizziness   Darvocet [Propoxyphene N-Acetaminophen]     nausea   Pravachol [Pravastatin Sodium]     Joint pain   PAST MEDICAL HISTORY Past Medical History:  Diagnosis Date   Anxiety    Arthritis    hip   Cataract    hx of   Diverticulosis 2007   History of diverticula   Emphysema  lung (HCC) 12/24/2019   GERD (gastroesophageal reflux disease)    Hyperlipidemia    Hypothyroidism    Prediabetes    Past Surgical History:  Procedure Laterality Date   APPENDECTOMY     CATARACT EXTRACTION W/ INTRAOCULAR LENS  IMPLANT, BILATERAL  12/09   Dr Micheal Likens bilateral   COLONOSCOPY     DOPPLER ECHOCARDIOGRAPHY  06/2008   Leg   HEMORROIDECTOMY     ML bone density  07/11   TUBAL LIGATION     YAG LASER APPLICATION Right 09/24/2012   Procedure: YAG LASER APPLICATION;  Surgeon: Loraine Leriche T. Nile Riggs, MD;  Location: [facility];  Service: Ophthalmology;  Laterality: Right;   YAG LASER APPLICATION Left 10/08/2012   Procedure: YAG LASER APPLICATION;  Surgeon: Loraine Leriche T. Nile Riggs, MD;  Location: [facility];  Service: Ophthalmology;  Laterality: Left;    FAMILY HISTORY Family History  Problem Relation Age of Onset   Esophageal cancer Mother 41       died at 32   Stomach cancer Mother 33       removed 1/2 of stomach, distal esophageal ca   Breast cancer Maternal Aunt    Colon cancer Neg Hx    Rectal cancer Neg Hx     SOCIAL HISTORY Social History   Tobacco Use   Smoking status: Former    Current packs/day: 0.00    Types: Cigarettes    Quit date: 04/24/1983    Years since quitting: 39.6   Smokeless tobacco: Never  Substance Use Topics   Alcohol use: Yes    Alcohol/week: 11.0 standard drinks of alcohol    Types: 4 Glasses of wine, 7 Standard drinks or equivalent per week   Drug use: No         OPHTHALMIC EXAM:  Base Eye Exam     Visual Acuity (Snellen - Linear)       Right Left   Dist cc 20/150 -1 20/20 -1   Dist ph cc NI     Correction: Glasses         Tonometry (Tonopen, 8:59 AM)       Right Left   Pressure 8 10         Pupils       Pupils Dark Light Shape React APD   Right PERRL 3 2 Round Brisk None   Left PERRL 3 2 Round Brisk None         Visual Fields       Left Right    Full Full         Extraocular Movement       Right Left    Full, Ortho Full, Ortho         Neuro/Psych     Oriented x3: Yes   Mood/Affect: Normal         Dilation     Both eyes: 2.5% Phenylephrine @ 8:59 AM           Slit Lamp and Fundus Exam     External Exam       Right Left   External Normal Normal         Slit Lamp Exam       Right Left   Lids/Lashes Dermatochalasis - upper lid, Meibomian gland dysfunction Dermatochalasis - upper lid, Meibomian gland dysfunction   Conjunctiva/Sclera Trace Injection White and quiet   Cornea Arcus, Well healed cataract wound, fine KP inferiorly Arcus, Well healed cataract wound   Anterior Chamber Deep and clear Deep and  clear   Iris Round and poorly dilated, No NVI Round and dilated   Lens PC IOL in good postion with open PC Multi focal PC IOL  in good postion with open PC   Anterior Vitreous Vitreous syneresis Vitreous syneresis         Fundus Exam       Right Left   Disc Blurred margin, mild hyperemia, + edema, no heme Sharp rim, Trace pallor   C/D Ratio 0.0 0.4   Macula Blunted foveal reflex, central edema, scattered IRH Good foveal reflex, Flat, No heme or edema   Vessels Vascular attenuation, Tortuous, AV crossing changes, CRVO Vascular attenuation, Tortuous   Periphery Attached, scattered MA Attached           Refraction     Wearing Rx       Sphere Cylinder Axis   Right -0.75 +0.75 154   Left -0.50 +1.50 165         Manifest Refraction       Sphere Cylinder Axis Dist VA   Right -1.00 +1.00 160 20/100-2   Left                IMAGING AND PROCEDURES  Imaging and Procedures for 11/20/2022  OCT, Retina - OU - Both Eyes       Right Eye Quality was poor. Central Foveal Thickness: 567. Progression has no prior data. Findings include abnormal foveal contour, intraretinal fluid, subretinal fluid (Severe central edema and disc edema).   Left Eye Quality was good. Central Foveal Thickness: 284. Progression has no prior data. Findings include normal foveal contour, no IRF, no SRF, vitreomacular adhesion .   Notes *Images captured and stored on drive  Diagnosis / Impression:  OD: CRVO w/ severe central edema and disc edema OS: NFP; no IRF/SRF   Clinical management:  See below  Abbreviations: NFP - Normal foveal profile. CME - cystoid macular edema. PED - pigment epithelial detachment. IRF - intraretinal fluid. SRF - subretinal fluid. EZ - ellipsoid zone. ERM - epiretinal membrane. ORA - outer retinal atrophy. ORT - outer retinal tubulation. SRHM - subretinal hyper-reflective material. IRHM - intraretinal hyper-reflective material      Fluorescein Angiography Optos (Transit OD)       Right Eye Progression has no prior data. Early phase findings include staining. Mid/Late phase findings include  leakage, staining (Scattered perivascular leakage and significant leakage peri fovea and disc).   Left Eye Progression has no prior data. Early phase findings include normal observations. Mid/Late phase findings include normal observations.   Notes *Images captured and stored on drive  Diagnosis / Impression:  OD: Scattered perivascular leakage and significant leakage peri fovea and disc -- CRVO OS: normal study  Clinical management:  See below  Abbreviations: NFP - Normal foveal profile. CME - cystoid macular edema. PED - pigment epithelial detachment. IRF - intraretinal fluid. SRF - subretinal fluid. EZ - ellipsoid zone. ERM - epiretinal membrane. ORA - outer retinal atrophy. ORT - outer retinal tubulation. SRHM - subretinal hyper-reflective material. IRHM - intraretinal hyper-reflective material      Intravitreal Injection, Pharmacologic Agent - OD - Right Eye       Time Out 11/20/2022. 10:49 AM. Confirmed correct patient, procedure, site, and patient consented.   Anesthesia Topical anesthesia was used. Anesthetic medications included Proparacaine 0.5%.   Procedure Preparation included 5% betadine to ocular surface, eyelid speculum.   Injection: 2 mg aflibercept 2 MG/0.05ML   Route: Intravitreal, Site: Right Eye  NDC: L6038910, Lot: [id number], Expiration date: 12/14/2023, Waste: 0 mL            ASSESSMENT/PLAN:    ICD-10-CM   1. 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 Hypertensive retinopathy of both eyes  H35.033     3. Essential hypertension  I10     4. Pseudophakia of both eyes  Z96.1      1. CRVO w/ CME, OD - previously followed with Dr. Luciana Axe - pt reports delayed presentation to Dr. Luciana Axe -- had several week history of decreased vision  OD prior to presentation - s/p IVA OD x3 (last 09.10.24) - s/p retrobulbar kenalog OD x1 - history of topical steroids - The natural history of retinal vein occlusion and macular edema and treatment options including observation, laser photocoagulation, and intravitreal antiVEGF injection with Avastin, Lucentis, Eylea, Vabysmo and intravitreal injection of steroids with triamcinolone and Ozurdex and the complications of these procedures including loss of vision, infection, cataract, glaucoma, and retinal detachment were discussed with patient. - Specifically discussed patient stabilization with anti-VEGF agents and increased potential for visual improvements.  Also discussed need for frequent follow up and potentially multiple injections given the chronic nature of the disease process. - BCVA 20/150  - OCT shows OD: Severe central edema and disc edema - FA (10.07.24) today shows OD: Scattered perivascular leakage and significant leakage peri fovea and disc - discussed significant inflammation OD - recommend PredForte and Bromfenac QID OD - rx sent to pharmacy on file  - recommend IVE OD #1 today, 10.07.24   - RBA of procedure discussed, questions answered - Eylea informed consent obtained and signed 10.07.24 (OD) - see procedure note - f/u 4 wks -- DFE/OCT, possible injection    2,3.  Hypertensive retinopathy OU - discussed importance of tight BP control - monitor   4. Pseudophakia OU  - s/p CE/IOL with Dr. Nile Riggs (OU) 2009  - IOL in good position, doing well  - s/p YAG Cap  w/ Dr. Nile Riggs (OU) 2014  - monitor   Ophthalmic Meds Ordered this visit:  Meds ordered this encounter  Medications   Bromfenac Sodium 0.07 % SOLN    Sig: Apply 1 drop to eye in the morning, at noon, in the evening, and at bedtime.    Dispense:  10 mL    Refill:  3   prednisoLONE acetate (PRED FORTE) 1 % ophthalmic suspension    Sig: Place 1 drop into the right eye 4 (four) times daily.    Dispense:  10 mL     Refill:  3   aflibercept (EYLEA) SOLN 2 mg     Return in about 4 weeks (around 12/18/2022) for f/u CRVO OD , DFE, OCT, Possible, IVE, OD.  There are no Patient Instructions on file for this visit.   Explained the diagnoses, plan, and follow up with the patient and they expressed understanding.  Patient expressed understanding of the importance of proper follow up care.   This document serves as a record of services personally performed by Karie Chimera, MD, PhD. It was created on their behalf by Annalee Genta, COMT. The creation of this record is the provider's dictation and/or activities during the visit.  Electronically signed by: Annalee Genta,  COMT 11/20/22 9:19 PM  Karie Chimera, M.D., Ph.D. Diseases & Surgery of the Retina and Vitreous [facility]  I have reviewed the above documentation for accuracy and completeness, and I agree with the above. Karie Chimera, M.D., Ph.D. 11/20/22 9:35 PM    Abbreviations: M myopia (nearsighted); A astigmatism; H hyperopia (farsighted); P presbyopia; Mrx spectacle prescription;  CTL contact lenses; OD right eye; OS left eye; OU both eyes  XT exotropia; ET esotropia; PEK punctate epithelial keratitis; PEE punctate epithelial erosions; DES dry eye syndrome; MGD meibomian gland dysfunction; ATs artificial tears; PFAT's preservative free artificial tears; NSC nuclear sclerotic cataract; PSC posterior subcapsular cataract; ERM epi-retinal membrane; PVD posterior vitreous detachment; RD retinal detachment; DM diabetes mellitus; DR diabetic retinopathy; NPDR non-proliferative diabetic retinopathy; PDR proliferative diabetic retinopathy; CSME clinically significant macular edema; DME diabetic macular edema; dbh dot blot hemorrhages; CWS cotton wool spot; POAG primary open angle glaucoma; C/D cup-to-disc ratio; HVF humphrey visual field; GVF goldmann visual field; OCT optical coherence tomography; IOP intraocular pressure; BRVO Branch  retinal vein occlusion; CRVO central retinal vein occlusion; CRAO central retinal artery occlusion; BRAO branch retinal artery occlusion; RT retinal tear; SB scleral buckle; PPV pars plana vitrectomy; VH Vitreous hemorrhage; PRP panretinal laser photocoagulation; IVK intravitreal kenalog; VMT vitreomacular traction; MH Macular hole;  NVD neovascularization of the disc; NVE neovascularization elsewhere; AREDS age related eye disease study; ARMD age related macular degeneration; POAG primary open angle glaucoma; EBMD epithelial/anterior basement membrane dystrophy; ACIOL anterior chamber intraocular lens; IOL intraocular lens; PCIOL posterior chamber intraocular lens; Phaco/IOL phacoemulsification with intraocular lens placement; PRK photorefractive keratectomy; LASIK laser assisted in situ keratomileusis; HTN hypertension; DM diabetes mellitus; COPD chronic obstructive pulmonary disease

## 2022-11-20 ENCOUNTER — Encounter (INDEPENDENT_AMBULATORY_CARE_PROVIDER_SITE_OTHER): Payer: Self-pay | Admitting: Ophthalmology

## 2022-11-20 ENCOUNTER — Ambulatory Visit (INDEPENDENT_AMBULATORY_CARE_PROVIDER_SITE_OTHER): Payer: PPO | Admitting: Ophthalmology

## 2022-11-20 DIAGNOSIS — I10 Essential (primary) hypertension: Secondary | ICD-10-CM

## 2022-11-20 DIAGNOSIS — H34811 Central retinal vein occlusion, right eye, with macular edema: Secondary | ICD-10-CM | POA: Diagnosis not present

## 2022-11-20 DIAGNOSIS — H35033 Hypertensive retinopathy, bilateral: Secondary | ICD-10-CM

## 2022-11-20 DIAGNOSIS — Z961 Presence of intraocular lens: Secondary | ICD-10-CM | POA: Diagnosis not present

## 2022-11-20 MED ORDER — AFLIBERCEPT 2MG/0.05ML IZ SOLN FOR KALEIDOSCOPE
2.0000 mg | INTRAVITREAL | Status: AC | PRN
Start: 2022-11-20 — End: 2022-11-20
  Administered 2022-11-20: 2 mg via INTRAVITREAL

## 2022-11-20 MED ORDER — PREDNISOLONE ACETATE 1 % OP SUSP: 1.0000 [drp] | Freq: Four times a day (QID) | OPHTHALMIC | 3 refills | Status: DC

## 2022-11-20 MED ORDER — BROMFENAC SODIUM 0.07 % OP SOLN: 1.0000 [drp] | Freq: Four times a day (QID) | OPHTHALMIC | 3 refills | Status: DC

## 2022-12-05 ENCOUNTER — Other Ambulatory Visit (INDEPENDENT_AMBULATORY_CARE_PROVIDER_SITE_OTHER): Payer: Self-pay

## 2022-12-05 ENCOUNTER — Encounter: Payer: Self-pay | Admitting: *Deleted

## 2022-12-05 MED ORDER — BROMFENAC SODIUM 0.07 % OP SOLN: 1.0000 [drp] | Freq: Four times a day (QID) | OPHTHALMIC | 6 refills | Status: DC

## 2022-12-14 NOTE — Progress Notes (Signed)
[facility] - Clinic Note  12/18/2022     CHIEF COMPLAINT Patient presents for Retina Follow Up   HISTORY OF PRESENT ILLNESS: Jordan Grant is a 82 y.o. female who presents to the clinic today for:   HPI     Retina Follow Up   Patient presents with  CRVO/BRVO.  In right eye.  This started 4 weeks ago.  Duration of 4 weeks.  Since onset it is stable.  I, the attending physician,  performed the HPI with the patient and updated documentation appropriately.        Comments   4 week retina follow up CRVO OD and I'VE OD pt is reporting vision is about the same still having trouble reading fine print she denies any flashes or floaters she is using brom qid OD thera tears bid OU systane bid ou       Last edited by Rennis Chris, MD on 12/18/2022 12:47 PM.    Patient states she hasn't noticed much improvement in her vision, she states she is not experiencing any irritation anymore  Referring physician: Babs Sciara, MD [address] [place],  [place] [address]  HISTORICAL INFORMATION:   Selected notes from the MEDICAL RECORD NUMBER Referred by Dr. Gerda Diss LEE: Dr. Luciana Axe 9.10.24 - IVA OD #3 Ocular Hx- CRVO w/ CME OD -- s/p retrobulbar kenalog x1, IVA OD x3 PMH-    CURRENT MEDICATIONS: Current Outpatient Medications (Ophthalmic Drugs)  Medication Sig   azelastine (OPTIVAR) 0.05 % ophthalmic solution Place 1 drop into the right eye 2 (two) times daily.   Bromfenac Sodium 0.07 % SOLN Apply 1 drop to eye in the morning, at noon, in the evening, and at bedtime.   Polyethyl Glycol-Propyl Glycol (SYSTANE OP) Apply 1 drop to eye daily.    prednisoLONE acetate (PRED FORTE) 1 % ophthalmic suspension Place 1 drop into the right eye 4 (four) times daily.   No current facility-administered medications for this visit. (Ophthalmic Drugs)   Current Outpatient Medications (Other)  Medication Sig   alendronate (FOSAMAX) 70 MG tablet Take 1 tablet (70 mg  total) by mouth every 7 (seven) days. Take with a full glass of water on an empty stomach.   Lactase (DAIRY DIGESTIVE PO) Take by mouth.   levothyroxine (SYNTHROID) 75 MCG tablet TAKE 1 TABLET BY MOUTH EVERY DAY   omeprazole (PRILOSEC) 40 MG capsule TAKE 1 CAPSULE BY MOUTH EVERY DAY   OVER THE COUNTER MEDICATION Calcium chews   rosuvastatin (CRESTOR) 10 MG tablet TAKE 1 TABLET BY MOUTH DAILY   traMADol (ULTRAM) 50 MG tablet TAKE 1/2 TABLET BY MOUTH IN THE MORNING AND MID AFTERNOON THEN TAKE 1 TABLET BY MOUTH EVERY EVENING   No current facility-administered medications for this visit. (Other)   REVIEW OF SYSTEMS: ROS   Positive for: Endocrine, Eyes Last edited by Etheleen Mayhew, COT on 12/18/2022  9:55 AM.      ALLERGIES Allergies  Allergen Reactions   Codeine     Nausea, dizziness   Darvocet [Propoxyphene N-Acetaminophen]     nausea   Pravachol [Pravastatin Sodium]     Joint pain   PAST MEDICAL HISTORY Past Medical History:  Diagnosis Date   Anxiety    Arthritis    hip   Cataract    hx of   Diverticulosis 2007   History of diverticula   Emphysema lung (HCC) 12/24/2019   GERD (gastroesophageal reflux disease)    Hyperlipidemia  Hypothyroidism    Prediabetes    Past Surgical History:  Procedure Laterality Date   APPENDECTOMY     CATARACT EXTRACTION W/ INTRAOCULAR LENS  IMPLANT, BILATERAL  12/09   Dr Micheal Likens bilateral   COLONOSCOPY     DOPPLER ECHOCARDIOGRAPHY  06/2008   Leg   HEMORROIDECTOMY     ML bone density  07/11   TUBAL LIGATION     YAG LASER APPLICATION Right 09/24/2012   Procedure: YAG LASER APPLICATION;  Surgeon: Loraine Leriche T. Nile Riggs, MD;  Location: [facility];  Service: Ophthalmology;  Laterality: Right;   YAG LASER APPLICATION Left 10/08/2012   Procedure: YAG LASER APPLICATION;  Surgeon: Loraine Leriche T. Nile Riggs, MD;  Location: [facility];  Service: Ophthalmology;  Laterality: Left;   FAMILY HISTORY Family History  Problem Relation Age of Onset   Esophageal  cancer Mother 24       died at 73   Stomach cancer Mother 55       removed 1/2 of stomach, distal esophageal ca   Breast cancer Maternal Aunt    Colon cancer Neg Hx    Rectal cancer Neg Hx     SOCIAL HISTORY Social History   Tobacco Use   Smoking status: Former    Current packs/day: 0.00    Types: Cigarettes    Quit date: 04/24/1983    Years since quitting: 39.6   Smokeless tobacco: Never  Substance Use Topics   Alcohol use: Yes    Alcohol/week: 11.0 standard drinks of alcohol    Types: 4 Glasses of wine, 7 Standard drinks or equivalent per week   Drug use: No         OPHTHALMIC EXAM:  Base Eye Exam     Visual Acuity (Snellen - Linear)       Right Left   Dist [place] 20/300 20/40 -1   Dist ph [place] 20/150 -1 20/30 -2         Tonometry (Tonopen, 10:02 AM)       Right Left   Pressure 12 14         Pupils       Pupils Dark Light Shape React APD   Right PERRL 3 2 Round Brisk None   Left PERRL 3 2 Round Brisk None         Visual Fields       Left Right    Full Full         Extraocular Movement       Right Left    Full, Ortho Full, Ortho         Neuro/Psych     Oriented x3: Yes   Mood/Affect: Normal         Dilation     Both eyes: 2.5% Phenylephrine @ 10:02 AM           Slit Lamp and Fundus Exam     External Exam       Right Left   External Normal Normal         Slit Lamp Exam       Right Left   Lids/Lashes Dermatochalasis - upper lid, Meibomian gland dysfunction Dermatochalasis - upper lid, Meibomian gland dysfunction   Conjunctiva/Sclera Trace Injection White and quiet   Cornea Arcus, Well healed cataract wound, fine KP inferiorly Arcus, Well healed cataract wound   Anterior Chamber Deep and clear Deep and clear   Iris Round and poorly dilated, No NVI Round and dilated   Lens PC IOL in good  postion with open PC Multi focal PC IOL in good postion with open PC   Anterior Vitreous Vitreous syneresis, mild asteroid hyalosis  Vitreous syneresis         Fundus Exam       Right Left   Disc Blurred margin, mild hyperemia, +edema -- improved, no heme Sharp rim, Trace pallor   C/D Ratio 0.0 0.4   Macula Blunted foveal reflex, central edema -- improved, scattered IRH Flat, good foveal reflex, No heme or edema   Vessels Attenuated, Tortuous, AV crossing changes, CRVO Vascular attenuation, Tortuous   Periphery Attached, scattered MA -- improved Attached           Refraction     Wearing Rx       Sphere Cylinder Axis   Right -0.75 +0.75 154   Left -0.50 +1.50 165            IMAGING AND PROCEDURES  Imaging and Procedures for 12/18/2022  OCT, Retina - OU - Both Eyes       Right Eye Quality was borderline. Central Foveal Thickness: 393. Progression has improved. Findings include normal foveal contour, no IRF, no SRF, intraretinal hyper-reflective material (Interval improvement in central edema, disc edema and vitreous opacities).   Left Eye Quality was good. Central Foveal Thickness: 289. Progression has been stable. Findings include normal foveal contour, no IRF, no SRF, vitreomacular adhesion .   Notes *Images captured and stored on drive  Diagnosis / Impression:  OD: CRVO w/ Interval improvement in central edema, disc edema and vitreous opacities OS: NFP; no IRF/SRF   Clinical management:  See below  Abbreviations: NFP - Normal foveal profile. CME - cystoid macular edema. PED - pigment epithelial detachment. IRF - intraretinal fluid. SRF - subretinal fluid. EZ - ellipsoid zone. ERM - epiretinal membrane. ORA - outer retinal atrophy. ORT - outer retinal tubulation. SRHM - subretinal hyper-reflective material. IRHM - intraretinal hyper-reflective material      Intravitreal Injection, Pharmacologic Agent - OD - Right Eye       Time Out 12/18/2022. 10:56 AM. Confirmed correct patient, procedure, site, and patient consented.   Anesthesia Topical anesthesia was used. Anesthetic medications  included Lidocaine 2%, Proparacaine 0.5%.   Procedure Preparation included 5% betadine to ocular surface, eyelid speculum. A (32g) needle was used.   Injection: 2 mg aflibercept 2 MG/0.05ML   Route: Intravitreal, Site: Right Eye   NDC: L6038910, Lot: [id number], Expiration date: 10/14/2023, Waste: 0 mL   Post-op Post injection exam found visual acuity of at least counting fingers. The patient tolerated the procedure well. There were no complications. The patient received written and verbal post procedure care education.             ASSESSMENT/PLAN:    ICD-10-CM   1. 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 Hypertensive retinopathy of both eyes  H35.033     3. Essential hypertension  I10     4. Pseudophakia of both eyes  Z96.1      1. CRVO w/ CME, OD - previously followed with Dr. Luciana Axe - pt reports delayed presentation to Dr. Luciana Axe -- had several week history of decreased vision OD prior to presentation - s/p IVA OD x3 w/ [facility] (last 09.10.24) - s/p retrobulbar kenalog OD x1 - FA (  10.07.24) OD: Scattered perivascular leakage and significant leakage peri fovea and disc - s/p IVE OD #1 (10.07.24) - history of topical steroids - BCVA stable at 20/150  - OCT shows OD: interval improvement in central edema, disc edema and vitreous opacities at 4 wks - recommend IVE OD #2 today, 11.04.24 w/ f/u in 4 wks  - RBA of procedure discussed, questions answered - Eylea informed consent obtained and signed 10.07.24 (OD) - see procedure note - discussed significant inflammation OD - continue PredForte and Bromfenac QID OD  - f/u 4 wks -- DFE/OCT, possible injection    2,3.  Hypertensive retinopathy OU - discussed importance of tight BP control - monitor  4. Pseudophakia OU  - s/p CE/IOL with Dr. Nile Riggs (OU)  2009  - IOL in good position, doing well  - s/p YAG Cap  w/ Dr. Nile Riggs (OU) 2014  - monitor  Ophthalmic Meds Ordered this visit:  Meds ordered this encounter  Medications   aflibercept (EYLEA) SOLN 2 mg     Return in about 4 weeks (around 01/15/2023) for f/u CRVO OD, DFE, OCT.  There are no Patient Instructions on file for this visit.   Explained the diagnoses, plan, and follow up with the patient and they expressed understanding.  Patient expressed understanding of the importance of proper follow up care.   This document serves as a record of services personally performed by Karie Chimera, MD, PhD. It was created on their behalf by Annalee Genta, COMT. The creation of this record is the provider's dictation and/or activities during the visit.  Electronically signed by: Annalee Genta, COMT 12/18/22 12:48 PM  This document serves as a record of services personally performed by Karie Chimera, MD, PhD. It was created on their behalf by Glee Arvin. Manson Passey, OA an ophthalmic technician. The creation of this record is the provider's dictation and/or activities during the visit.    Electronically signed by: Glee Arvin. Manson Passey, OA 12/18/22 12:48 PM  Karie Chimera, M.D., Ph.D. Diseases & Surgery of the Retina and Vitreous [facility]  I have reviewed the above documentation for accuracy and completeness, and I agree with the above. Karie Chimera, M.D., Ph.D. 12/18/22 12:50 PM  Abbreviations: M myopia (nearsighted); A astigmatism; H hyperopia (farsighted); P presbyopia; Mrx spectacle prescription;  CTL contact lenses; OD right eye; OS left eye; OU both eyes  XT exotropia; ET esotropia; PEK punctate epithelial keratitis; PEE punctate epithelial erosions; DES dry eye syndrome; MGD meibomian gland dysfunction; ATs artificial tears; PFAT's preservative free artificial tears; NSC nuclear sclerotic cataract; PSC posterior subcapsular cataract; ERM epi-retinal membrane; PVD  posterior vitreous detachment; RD retinal detachment; DM diabetes mellitus; DR diabetic retinopathy; NPDR non-proliferative diabetic retinopathy; PDR proliferative diabetic retinopathy; CSME clinically significant macular edema; DME diabetic macular edema; dbh dot blot hemorrhages; CWS cotton wool spot; POAG primary open angle glaucoma; C/D cup-to-disc ratio; HVF humphrey visual field; GVF goldmann visual field; OCT optical coherence tomography; IOP intraocular pressure; BRVO Branch retinal vein occlusion; CRVO central retinal vein occlusion; CRAO central retinal artery occlusion; BRAO branch retinal artery occlusion; RT retinal tear; SB scleral buckle; PPV pars plana vitrectomy; VH Vitreous hemorrhage; PRP panretinal laser photocoagulation; IVK intravitreal kenalog; VMT vitreomacular traction; MH Macular hole;  NVD neovascularization of the disc; NVE neovascularization elsewhere; AREDS age related eye disease study; ARMD age related macular degeneration; POAG primary open angle glaucoma; EBMD epithelial/anterior basement membrane dystrophy; ACIOL anterior chamber intraocular lens; IOL intraocular lens; PCIOL posterior  chamber intraocular lens; Phaco/IOL phacoemulsification with intraocular lens placement; PRK photorefractive keratectomy; LASIK laser assisted in situ keratomileusis; HTN hypertension; DM diabetes mellitus; COPD chronic obstructive pulmonary disease

## 2022-12-18 ENCOUNTER — Ambulatory Visit (INDEPENDENT_AMBULATORY_CARE_PROVIDER_SITE_OTHER): Payer: PPO | Admitting: Ophthalmology

## 2022-12-18 ENCOUNTER — Encounter (INDEPENDENT_AMBULATORY_CARE_PROVIDER_SITE_OTHER): Payer: Self-pay | Admitting: Ophthalmology

## 2022-12-18 DIAGNOSIS — Z961 Presence of intraocular lens: Secondary | ICD-10-CM

## 2022-12-18 DIAGNOSIS — H35033 Hypertensive retinopathy, bilateral: Secondary | ICD-10-CM

## 2022-12-18 DIAGNOSIS — I10 Essential (primary) hypertension: Secondary | ICD-10-CM

## 2022-12-18 DIAGNOSIS — H34811 Central retinal vein occlusion, right eye, with macular edema: Secondary | ICD-10-CM | POA: Diagnosis not present

## 2022-12-18 MED ORDER — AFLIBERCEPT 2MG/0.05ML IZ SOLN FOR KALEIDOSCOPE
2.0000 mg | INTRAVITREAL | Status: AC | PRN
Start: 2022-12-18 — End: 2022-12-18
  Administered 2022-12-18: 2 mg via INTRAVITREAL

## 2023-01-02 ENCOUNTER — Other Ambulatory Visit (INDEPENDENT_AMBULATORY_CARE_PROVIDER_SITE_OTHER): Payer: Self-pay

## 2023-01-02 MED ORDER — BROMFENAC SODIUM 0.07 % OP SOLN: 1.0000 [drp] | Freq: Four times a day (QID) | OPHTHALMIC | 6 refills | Status: DC

## 2023-01-04 NOTE — Progress Notes (Shared)
[facility] - Clinic Note  01/15/2023     CHIEF COMPLAINT Patient presents for No chief complaint on file.   HISTORY OF PRESENT ILLNESS: Jordan Grant is a 82 y.o. female who presents to the clinic today for:    Patient states she hasn't noticed much improvement in her vision, she states she is not experiencing any irritation anymore  Referring physician: Babs Sciara, MD [address] [place],  [place] [address]  HISTORICAL INFORMATION:   Selected notes from the MEDICAL RECORD NUMBER Referred by Dr. Gerda Diss LEE: Dr. Luciana Axe 9.10.24 - IVA OD #3 Ocular Hx- CRVO w/ CME OD -- s/p retrobulbar kenalog x1, IVA OD x3 PMH-    CURRENT MEDICATIONS: Current Outpatient Medications (Ophthalmic Drugs)  Medication Sig   azelastine (OPTIVAR) 0.05 % ophthalmic solution Place 1 drop into the right eye 2 (two) times daily.   Bromfenac Sodium (PROLENSA) 0.07 % SOLN Apply 1 drop to eye in the morning, at noon, in the evening, and at bedtime.   Bromfenac Sodium 0.07 % SOLN Apply 1 drop to eye in the morning, at noon, in the evening, and at bedtime.   Polyethyl Glycol-Propyl Glycol (SYSTANE OP) Apply 1 drop to eye daily.    prednisoLONE acetate (PRED FORTE) 1 % ophthalmic suspension Place 1 drop into the right eye 4 (four) times daily.   No current facility-administered medications for this visit. (Ophthalmic Drugs)   Current Outpatient Medications (Other)  Medication Sig   alendronate (FOSAMAX) 70 MG tablet Take 1 tablet (70 mg total) by mouth every 7 (seven) days. Take with a full glass of water on an empty stomach.   Lactase (DAIRY DIGESTIVE PO) Take by mouth.   levothyroxine (SYNTHROID) 75 MCG tablet TAKE 1 TABLET BY MOUTH EVERY DAY   omeprazole (PRILOSEC) 40 MG capsule TAKE 1 CAPSULE BY MOUTH EVERY DAY   OVER THE COUNTER MEDICATION Calcium chews   rosuvastatin (CRESTOR) 10 MG tablet TAKE 1 TABLET BY MOUTH DAILY   traMADol (ULTRAM) 50 MG tablet TAKE 1/2  TABLET BY MOUTH IN THE MORNING AND MID AFTERNOON THEN TAKE 1 TABLET BY MOUTH EVERY EVENING   No current facility-administered medications for this visit. (Other)   REVIEW OF SYSTEMS:    ALLERGIES Allergies  Allergen Reactions   Codeine     Nausea, dizziness   Darvocet [Propoxyphene N-Acetaminophen]     nausea   Pravachol [Pravastatin Sodium]     Joint pain   PAST MEDICAL HISTORY Past Medical History:  Diagnosis Date   Anxiety    Arthritis    hip   Cataract    hx of   Diverticulosis 2007   History of diverticula   Emphysema lung (HCC) 12/24/2019   GERD (gastroesophageal reflux disease)    Hyperlipidemia    Hypothyroidism    Prediabetes    Past Surgical History:  Procedure Laterality Date   APPENDECTOMY     CATARACT EXTRACTION W/ INTRAOCULAR LENS  IMPLANT, BILATERAL  12/09   Dr Micheal Likens bilateral   COLONOSCOPY     DOPPLER ECHOCARDIOGRAPHY  06/2008   Leg   HEMORROIDECTOMY     ML bone density  07/11   TUBAL LIGATION     YAG LASER APPLICATION Right 09/24/2012   Procedure: YAG LASER APPLICATION;  Surgeon: Loraine Leriche T. Nile Riggs, MD;  Location: [facility];  Service: Ophthalmology;  Laterality: Right;   YAG LASER APPLICATION Left 10/08/2012   Procedure: YAG LASER APPLICATION;  Surgeon: Loraine Leriche T. Nile Riggs, MD;  Location: [facility];  Service: Ophthalmology;  Laterality: Left;   FAMILY HISTORY Family History  Problem Relation Age of Onset   Esophageal cancer Mother 22       died at 37   Stomach cancer Mother 56       removed 1/2 of stomach, distal esophageal ca   Breast cancer Maternal Aunt    Colon cancer Neg Hx    Rectal cancer Neg Hx     SOCIAL HISTORY Social History   Tobacco Use   Smoking status: Former    Current packs/day: 0.00    Types: Cigarettes    Quit date: 04/24/1983    Years since quitting: 39.7   Smokeless tobacco: Never  Substance Use Topics   Alcohol use: Yes    Alcohol/week: 11.0 standard drinks of alcohol    Types: 4 Glasses of wine, 7 Standard drinks or  equivalent per week   Drug use: No         OPHTHALMIC EXAM:  Not recorded     IMAGING AND PROCEDURES  Imaging and Procedures for 01/15/2023           ASSESSMENT/PLAN:    ICD-10-CM   1. 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 Hypertensive retinopathy of both eyes  H35.033     3. Essential hypertension  I10     4. Pseudophakia of both eyes  Z96.1      1. CRVO w/ CME, OD - previously followed with Dr. Luciana Axe - pt reports delayed presentation to Dr. Luciana Axe -- had several week history of decreased vision OD prior to presentation - s/p IVA OD x3 w/ [facility] (last 09.10.24) - s/p retrobulbar kenalog OD x1 - FA (10.07.24) OD: Scattered perivascular leakage and significant leakage peri fovea and disc - s/p IVE OD #1 (10.07.24), #2 (11.04.24) - history of topical steroids - BCVA stable at 20/150  - OCT shows OD: interval improvement in central edema, disc edema and vitreous opacities at 4 wks - recommend IVE OD #3 today, 12.02.24 w/ f/u in 4 wks  - RBA of procedure discussed, questions answered - Eylea informed consent obtained and signed 10.07.24 (OD) - see procedure note - discussed significant inflammation OD - continue PredForte and Bromfenac QID OD  - f/u 4 wks -- DFE/OCT, possible injection    2,3.  Hypertensive retinopathy OU - discussed importance of tight BP control - monitor  4. Pseudophakia OU  - s/p CE/IOL with Dr. Nile Riggs (OU) 2009  - IOL in good position, doing well  - s/p YAG Cap  w/ Dr. Nile Riggs (OU) 2014  - monitor  Ophthalmic Meds Ordered this visit:  No orders of the defined types were placed in this encounter.    No follow-ups on file.  There are no Patient Instructions on file for this visit.   Explained the diagnoses, plan, and follow up with the patient and they expressed understanding.  Patient expressed understanding of the importance of proper follow up care.   This document serves as a record  of services personally performed by Karie Chimera, MD, PhD. It was created on their behalf by Glee Arvin. Manson Passey, OA an ophthalmic technician. The creation of this record is the provider's dictation and/or activities during the visit.    Electronically signed by: Glee Arvin. Manson Passey, OA 01/04/23 1:45 PM   Karie Chimera, M.D., Ph.D. Diseases & Surgery of the Retina and Vitreous [facility]  Abbreviations: M myopia (nearsighted); A astigmatism; H hyperopia (farsighted); P presbyopia; Mrx spectacle prescription;  CTL contact lenses; OD right eye; OS left eye; OU both eyes  XT exotropia; ET esotropia; PEK punctate epithelial keratitis; PEE punctate epithelial erosions; DES dry eye syndrome; MGD meibomian gland dysfunction; ATs artificial tears; PFAT's preservative free artificial tears; NSC nuclear sclerotic cataract; PSC posterior subcapsular cataract; ERM epi-retinal membrane; PVD posterior vitreous detachment; RD retinal detachment; DM diabetes mellitus; DR diabetic retinopathy; NPDR non-proliferative diabetic retinopathy; PDR proliferative diabetic retinopathy; CSME clinically significant macular edema; DME diabetic macular edema; dbh dot blot hemorrhages; CWS cotton wool spot; POAG primary open angle glaucoma; C/D cup-to-disc ratio; HVF humphrey visual field; GVF goldmann visual field; OCT optical coherence tomography; IOP intraocular pressure; BRVO Branch retinal vein occlusion; CRVO central retinal vein occlusion; CRAO central retinal artery occlusion; BRAO branch retinal artery occlusion; RT retinal tear; SB scleral buckle; PPV pars plana vitrectomy; VH Vitreous hemorrhage; PRP panretinal laser photocoagulation; IVK intravitreal kenalog; VMT vitreomacular traction; MH Macular hole;  NVD neovascularization of the disc; NVE neovascularization elsewhere; AREDS age related eye disease study; ARMD age related macular degeneration; POAG primary open angle glaucoma; EBMD  epithelial/anterior basement membrane dystrophy; ACIOL anterior chamber intraocular lens; IOL intraocular lens; PCIOL posterior chamber intraocular lens; Phaco/IOL phacoemulsification with intraocular lens placement; PRK photorefractive keratectomy; LASIK laser assisted in situ keratomileusis; HTN hypertension; DM diabetes mellitus; COPD chronic obstructive pulmonary disease

## 2023-01-10 ENCOUNTER — Other Ambulatory Visit: Payer: Self-pay

## 2023-01-10 ENCOUNTER — Telehealth: Payer: Self-pay | Admitting: Family Medicine

## 2023-01-10 NOTE — Telephone Encounter (Signed)
Sent to provider

## 2023-01-10 NOTE — Progress Notes (Signed)
[facility] - Clinic Note  01/16/2023     CHIEF COMPLAINT Patient presents for Retina Follow Up   HISTORY OF PRESENT ILLNESS: Jordan Grant is a 82 y.o. female who presents to the clinic today for:   HPI     Retina Follow Up   Patient presents with  CRVO/BRVO.  In right eye.  This started 4 weeks ago.  Duration of 4 weeks.  Since onset it is stable.  I, the attending physician,  performed the HPI with the patient and updated documentation appropriately.        Comments   Patient feels the vision is better at times but generally the same. She is using AT's and Bromfenac OD TID and Pred OD QID.       Last edited by Rennis Chris, MD on 01/16/2023  4:09 PM.     Patient states some days she feels like her vision is better, other days, it doesn't see that good  Referring physician: Babs Sciara, MD [address] [place],  [place] [address]  HISTORICAL INFORMATION:   Selected notes from the MEDICAL RECORD NUMBER Referred by Dr. Gerda Diss LEE: Dr. Luciana Axe 9.10.24 - IVA OD #3 Ocular Hx- CRVO w/ CME OD -- s/p retrobulbar kenalog x1, IVA OD x3 PMH-    CURRENT MEDICATIONS: Current Outpatient Medications (Ophthalmic Drugs)  Medication Sig   azelastine (OPTIVAR) 0.05 % ophthalmic solution Place 1 drop into the right eye 2 (two) times daily.   Bromfenac Sodium (PROLENSA) 0.07 % SOLN Apply 1 drop to eye in the morning, at noon, in the evening, and at bedtime.   Bromfenac Sodium 0.07 % SOLN Apply 1 drop to eye in the morning, at noon, in the evening, and at bedtime.   Polyethyl Glycol-Propyl Glycol (SYSTANE OP) Apply 1 drop to eye daily.    prednisoLONE acetate (PRED FORTE) 1 % ophthalmic suspension Place 1 drop into the right eye 4 (four) times daily.   No current facility-administered medications for this visit. (Ophthalmic Drugs)   Current Outpatient Medications (Other)  Medication Sig   alendronate (FOSAMAX) 70 MG tablet Take 1 tablet (70 mg  total) by mouth every 7 (seven) days. Take with a full glass of water on an empty stomach.   Lactase (DAIRY DIGESTIVE PO) Take by mouth.   levothyroxine (SYNTHROID) 75 MCG tablet TAKE 1 TABLET BY MOUTH EVERY DAY   omeprazole (PRILOSEC) 40 MG capsule TAKE 1 CAPSULE BY MOUTH EVERY DAY   OVER THE COUNTER MEDICATION Calcium chews   rosuvastatin (CRESTOR) 10 MG tablet TAKE 1 TABLET BY MOUTH DAILY   traMADol (ULTRAM) 50 MG tablet TAKE 1/2 TABLET BY MOUTH IN THE MORNING AND MID AFTERNOON THEN TAKE 1 TABLET BY MOUTH EVERY EVENING   No current facility-administered medications for this visit. (Other)   REVIEW OF SYSTEMS: ROS   Positive for: Endocrine, Eyes Last edited by Charlette Caffey, COT on 01/16/2023  1:11 PM.     ALLERGIES Allergies  Allergen Reactions   Codeine     Nausea, dizziness   Darvocet [Propoxyphene N-Acetaminophen]     nausea   Pravachol [Pravastatin Sodium]     Joint pain   PAST MEDICAL HISTORY Past Medical History:  Diagnosis Date   Anxiety    Arthritis    hip   Cataract    hx of   Diverticulosis 2007   History of diverticula   Emphysema lung (HCC) 12/24/2019   GERD (gastroesophageal reflux disease)  Hyperlipidemia    Hypothyroidism    Prediabetes    Past Surgical History:  Procedure Laterality Date   APPENDECTOMY     CATARACT EXTRACTION W/ INTRAOCULAR LENS  IMPLANT, BILATERAL  12/09   Dr Micheal Likens bilateral   COLONOSCOPY     DOPPLER ECHOCARDIOGRAPHY  06/2008   Leg   HEMORROIDECTOMY     ML bone density  07/11   TUBAL LIGATION     YAG LASER APPLICATION Right 09/24/2012   Procedure: YAG LASER APPLICATION;  Surgeon: Loraine Leriche T. Nile Riggs, MD;  Location: [facility];  Service: Ophthalmology;  Laterality: Right;   YAG LASER APPLICATION Left 10/08/2012   Procedure: YAG LASER APPLICATION;  Surgeon: Loraine Leriche T. Nile Riggs, MD;  Location: [facility];  Service: Ophthalmology;  Laterality: Left;   FAMILY HISTORY Family History  Problem Relation Age of Onset   Esophageal cancer  Mother 84       died at 76   Stomach cancer Mother 54       removed 1/2 of stomach, distal esophageal ca   Breast cancer Maternal Aunt    Colon cancer Neg Hx    Rectal cancer Neg Hx     SOCIAL HISTORY Social History   Tobacco Use   Smoking status: Former    Current packs/day: 0.00    Types: Cigarettes    Quit date: 04/24/1983    Years since quitting: 39.7   Smokeless tobacco: Never  Substance Use Topics   Alcohol use: Yes    Alcohol/week: 11.0 standard drinks of alcohol    Types: 4 Glasses of wine, 7 Standard drinks or equivalent per week   Drug use: No         OPHTHALMIC EXAM:  Base Eye Exam     Visual Acuity (Snellen - Linear)       Right Left   Dist [place] 20/300 20/40   Dist ph [place] 20/70 20/30         Tonometry (Tonopen, 1:15 PM)       Right Left   Pressure 10 11         Pupils       Dark Light Shape React APD   Right 3 2 Round Brisk None   Left 3 2 Round Brisk None         Visual Fields       Left Right    Full Full         Extraocular Movement       Right Left    Full, Ortho Full, Ortho         Neuro/Psych     Oriented x3: Yes   Mood/Affect: Normal         Dilation     Both eyes: 1.0% Mydriacyl, 2.5% Phenylephrine @ 1:12 PM           Slit Lamp and Fundus Exam     External Exam       Right Left   External Normal Normal         Slit Lamp Exam       Right Left   Lids/Lashes Dermatochalasis - upper lid, Meibomian gland dysfunction Dermatochalasis - upper lid, Meibomian gland dysfunction   Conjunctiva/Sclera Trace Injection White and quiet   Cornea Arcus, Well healed cataract wound, fine KP inferiorly Arcus, Well healed cataract wound   Anterior Chamber Deep and clear Deep and clear   Iris Round and poorly dilated, No NVI Round and dilated   Lens PC IOL in good  postion with open PC Multi focal PC IOL in good postion with open PC   Anterior Vitreous Vitreous syneresis, mild asteroid hyalosis Vitreous syneresis          Fundus Exam       Right Left   Disc Blurred margin, mild hyperemia, +edema -- improved, no heme Sharp rim, Trace pallor   C/D Ratio 0.0 0.4   Macula good foveal reflex, central edema -- improved, scattered IRH Flat, good foveal reflex, No heme or edema   Vessels Attenuated, Tortuous, AV crossing changes, CRVO attenuated, Tortuous   Periphery Attached, scattered MA -- improved Attached           Refraction     Wearing Rx       Sphere Cylinder Axis   Right -0.75 +0.75 154   Left -0.50 +1.50 165            IMAGING AND PROCEDURES  Imaging and Procedures for 01/16/2023  OCT, Retina - OU - Both Eyes       Right Eye Quality was borderline. Central Foveal Thickness: 381. Progression has improved. Findings include normal foveal contour, no IRF, no SRF, intraretinal hyper-reflective material (Interval improvement in central edema, foveal contour, disc edema and vitreous opacities).   Left Eye Quality was good. Central Foveal Thickness: 289. Progression has been stable. Findings include normal foveal contour, no IRF, no SRF, vitreomacular adhesion .   Notes *Images captured and stored on drive  Diagnosis / Impression:  OD: CRVO w/ interval improvement in central edema, foveal contour, disc edema and vitreous opacities OS: NFP; no IRF/SRF   Clinical management:  See below  Abbreviations: NFP - Normal foveal profile. CME - cystoid macular edema. PED - pigment epithelial detachment. IRF - intraretinal fluid. SRF - subretinal fluid. EZ - ellipsoid zone. ERM - epiretinal membrane. ORA - outer retinal atrophy. ORT - outer retinal tubulation. SRHM - subretinal hyper-reflective material. IRHM - intraretinal hyper-reflective material      Intravitreal Injection, Pharmacologic Agent - OD - Right Eye       Time Out 01/16/2023. 1:45 PM. Confirmed correct patient, procedure, site, and patient consented.   Anesthesia Topical anesthesia was used. Anesthetic medications  included Lidocaine 2%, Proparacaine 0.5%.   Procedure Preparation included 5% betadine to ocular surface, eyelid speculum. A (32g) needle was used.   Injection: 2 mg aflibercept 2 MG/0.05ML   Route: Intravitreal, Site: Right Eye   NDC: L6038910, Lot: [id number], Expiration date: 06/12/2024, Waste: 0 mL   Post-op Post injection exam found visual acuity of at least counting fingers. The patient tolerated the procedure well. There were no complications. The patient received written and verbal post procedure care education.   Notes An AC tap was performed following injection due to elevated IOP using a 30 gauge needle on a syringe with the plunger removed. The needle was placed at the limbus at 7 oclock and approximately 0.09cc of aqueous was removed from the anterior chamber. Betadine was applied to the tap area before and after the paracentesis was performed. There were no complications. The patient tolerated the procedure well. The IOP was rechecked and was found to be ~6 mmHg by digital palpation.            ASSESSMENT/PLAN:    ICD-10-CM   1. 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 Hypertensive retinopathy of both eyes  H35.033     3. Essential hypertension  I10     4. Pseudophakia of both eyes  Z96.1      1. CRVO w/ CME, OD - previously followed with Dr. Luciana Axe - pt reports delayed presentation to Dr. Luciana Axe -- had several week history of decreased vision OD prior to presentation - s/p IVA OD x3 w/ [facility] (last 09.10.24) - s/p retrobulbar kenalog OD x1 - FA (10.07.24) OD: Scattered perivascular leakage and significant leakage peri fovea and disc - s/p IVE OD #1 (10.07.24), #2 (11.04.24) - history of topical steroids - BCVA improved to 20/70 from 20/150  - OCT shows OD: interval improvement in central  edema, foveal contour, disc edema and vitreous opacities at 4 wks - recommend IVE OD #3 today, 12.03.24 w/ f/u in 4 wks  - RBA of procedure discussed, questions answered - Eylea informed consent obtained and signed 10.07.24 (OD) - see procedure note - discussed significant inflammation OD - continue PredForte and Bromfenac QID OD  - f/u 4 wks -- DFE/OCT, possible injection    2,3.  Hypertensive retinopathy OU - discussed importance of tight BP control - monitor  4. Pseudophakia OU  - s/p CE/IOL with Dr. Nile Riggs (OU) 2009  - IOL in good position, doing well  - s/p YAG Cap  w/ Dr. Nile Riggs (OU) 2014  - monitor  Ophthalmic Meds Ordered this visit:  Meds ordered this encounter  Medications   aflibercept (EYLEA) SOLN 2 mg     Return in about 4 weeks (around 02/13/2023) for f/u CRVO OD, DFE, OCT.  There are no Patient Instructions on file for this visit.   Explained the diagnoses, plan, and follow up with the patient and they expressed understanding.  Patient expressed understanding of the importance of proper follow up care.   This document serves as a record of services personally performed by Karie Chimera, MD, PhD. It was created on their behalf by Charlette Caffey, COT an ophthalmic technician. The creation of this record is the provider's dictation and/or activities during the visit.    Electronically signed by:  Charlette Caffey, COT  01/16/23 4:10 PM  This document serves as a record of services personally performed by Karie Chimera, MD, PhD. It was created on their behalf by Glee Arvin. Manson Passey, OA an ophthalmic technician. The creation of this record is the provider's dictation and/or activities during the visit.    Electronically signed by: Glee Arvin. Manson Passey, OA 01/16/23 4:10 PM  Karie Chimera, M.D., Ph.D. Diseases & Surgery of the Retina and Vitreous [facility]  I have reviewed the above documentation for accuracy and completeness, and I  agree with the above. Karie Chimera, M.D., Ph.D. 01/16/23 4:12 PM   Abbreviations: M myopia (nearsighted); A astigmatism; H hyperopia (farsighted); P presbyopia; Mrx spectacle prescription;  CTL contact lenses; OD right eye; OS left eye; OU both eyes  XT exotropia; ET esotropia; PEK punctate epithelial keratitis; PEE punctate epithelial erosions; DES dry eye syndrome; MGD meibomian gland dysfunction; ATs artificial tears; PFAT's preservative free artificial tears; NSC nuclear sclerotic cataract; PSC posterior subcapsular cataract; ERM epi-retinal membrane; PVD posterior vitreous detachment; RD retinal detachment; DM diabetes mellitus; DR diabetic retinopathy; NPDR non-proliferative diabetic retinopathy; PDR proliferative diabetic retinopathy; CSME clinically significant macular edema; DME diabetic macular edema; dbh dot blot hemorrhages; CWS cotton wool spot; POAG primary open angle glaucoma; C/D  cup-to-disc ratio; HVF humphrey visual field; GVF goldmann visual field; OCT optical coherence tomography; IOP intraocular pressure; BRVO Branch retinal vein occlusion; CRVO central retinal vein occlusion; CRAO central retinal artery occlusion; BRAO branch retinal artery occlusion; RT retinal tear; SB scleral buckle; PPV pars plana vitrectomy; VH Vitreous hemorrhage; PRP panretinal laser photocoagulation; IVK intravitreal kenalog; VMT vitreomacular traction; MH Macular hole;  NVD neovascularization of the disc; NVE neovascularization elsewhere; AREDS age related eye disease study; ARMD age related macular degeneration; POAG primary open angle glaucoma; EBMD epithelial/anterior basement membrane dystrophy; ACIOL anterior chamber intraocular lens; IOL intraocular lens; PCIOL posterior chamber intraocular lens; Phaco/IOL phacoemulsification with intraocular lens placement; PRK photorefractive keratectomy; LASIK laser assisted in situ keratomileusis; HTN hypertension; DM diabetes mellitus; COPD chronic obstructive  pulmonary disease

## 2023-01-10 NOTE — Telephone Encounter (Signed)
Refill on    traMADol (ULTRAM) 50 MG tablet  [facility] [address]

## 2023-01-15 ENCOUNTER — Other Ambulatory Visit: Payer: Self-pay | Admitting: Family Medicine

## 2023-01-15 ENCOUNTER — Encounter (INDEPENDENT_AMBULATORY_CARE_PROVIDER_SITE_OTHER): Payer: PPO | Admitting: Ophthalmology

## 2023-01-15 ENCOUNTER — Telehealth: Payer: Self-pay | Admitting: *Deleted

## 2023-01-15 DIAGNOSIS — I10 Essential (primary) hypertension: Secondary | ICD-10-CM

## 2023-01-15 DIAGNOSIS — Z961 Presence of intraocular lens: Secondary | ICD-10-CM

## 2023-01-15 DIAGNOSIS — H35033 Hypertensive retinopathy, bilateral: Secondary | ICD-10-CM

## 2023-01-15 DIAGNOSIS — H34811 Central retinal vein occlusion, right eye, with macular edema: Secondary | ICD-10-CM

## 2023-01-15 MED ORDER — TRAMADOL HCL 50 MG PO TABS: ORAL_TABLET | ORAL | 3 refills | Status: DC

## 2023-01-15 NOTE — Telephone Encounter (Unsigned)
Copied from CRM [id number]. Topic: Clinical - Medication Refill >> Jan 15, 2023  4:03 PM Thomes Dinning wrote: Most Recent Primary Care Visit:  Provider: Lilyan Punt A  Department: [facility]  Visit Type: OFFICE VISIT  Date: 11/07/2022  Medication: traMADol (ULTRAM) 50 MG tablet  Has the patient contacted their pharmacy? Yes (Agent: If no, request that the patient contact the pharmacy for the refill. If patient does not wish to contact the pharmacy document the reason why and proceed with request.) (Agent: If yes, when and what did the pharmacy advise?)  Is this the correct pharmacy for this prescription? Yes If no, delete pharmacy and type the correct one.  This is the patient's preferred pharmacy:  [facility] - [place], [place] - [address] [place] [address] Phone: [phone] Fax: [phone]  [facility] [phone] - [place], [place] - [address] AT [facility] [address] [place] [address] Phone: [phone] Fax: [phone]   Has the prescription been filled recently? No  Is the patient out of the medication? Yes  Has the patient been seen for an appointment in the last year OR does the patient have an upcoming appointment? Yes  Can we respond through MyChart? Yes  Agent: Please be advised that Rx refills may take up to 3 business days. We ask that you follow-up with your pharmacy.

## 2023-01-15 NOTE — Telephone Encounter (Signed)
FYI-refill sent to [facility] [address] thank you

## 2023-01-16 ENCOUNTER — Other Ambulatory Visit (HOSPITAL_COMMUNITY): Payer: Self-pay | Admitting: Family Medicine

## 2023-01-16 ENCOUNTER — Ambulatory Visit (INDEPENDENT_AMBULATORY_CARE_PROVIDER_SITE_OTHER): Payer: PPO | Admitting: Ophthalmology

## 2023-01-16 ENCOUNTER — Encounter (INDEPENDENT_AMBULATORY_CARE_PROVIDER_SITE_OTHER): Payer: Self-pay | Admitting: Ophthalmology

## 2023-01-16 DIAGNOSIS — Z961 Presence of intraocular lens: Secondary | ICD-10-CM | POA: Diagnosis not present

## 2023-01-16 DIAGNOSIS — I10 Essential (primary) hypertension: Secondary | ICD-10-CM

## 2023-01-16 DIAGNOSIS — H34811 Central retinal vein occlusion, right eye, with macular edema: Secondary | ICD-10-CM | POA: Diagnosis not present

## 2023-01-16 DIAGNOSIS — H35033 Hypertensive retinopathy, bilateral: Secondary | ICD-10-CM

## 2023-01-16 DIAGNOSIS — Z1231 Encounter for screening mammogram for malignant neoplasm of breast: Secondary | ICD-10-CM

## 2023-01-16 MED ORDER — AFLIBERCEPT 2MG/0.05ML IZ SOLN FOR KALEIDOSCOPE
2.0000 mg | INTRAVITREAL | Status: AC | PRN
  Administered 2023-01-16: 2 mg via INTRAVITREAL

## 2023-02-01 NOTE — Progress Notes (Addendum)
 [facility] - Clinic Note  02/13/2023     CHIEF COMPLAINT Patient presents for Retina Follow Up   HISTORY OF PRESENT ILLNESS: Jordan Grant is a 82 y.o. female who presents to the clinic today for:   HPI     Retina Follow Up   Patient presents with  CRVO/BRVO.  In right eye.  This started 4 weeks ago.  Duration of 4 weeks.  Since onset it is stable.  I, the attending physician,  performed the HPI with the patient and updated documentation appropriately.        Comments   4 week retina follow up CRVO OD and IVE OD pt is reporting no vision changes noticed she denies any flashes or floaters       Last edited by Valdemar Rogue, MD on 02/13/2023  5:49 PM.     Patient states vision is okay  Referring physician: Meridee Seltzer, MD [address] [place],  [place] [address]  HISTORICAL INFORMATION:   Selected notes from the MEDICAL RECORD NUMBER Referred by Dr. Alphonsa LEE: Dr. Elner 9.10.24 - IVA OD #3 Ocular Hx- CRVO w/ CME OD -- s/p retrobulbar kenalog x1, IVA OD x3 PMH-    CURRENT MEDICATIONS: Current Outpatient Medications (Ophthalmic Drugs)  Medication Sig   azelastine  (OPTIVAR ) 0.05 % ophthalmic solution Place 1 drop into the right eye 2 (two) times daily.   Bromfenac  Sodium (PROLENSA ) 0.07 % SOLN Apply 1 drop to eye in the morning, at noon, in the evening, and at bedtime.   Bromfenac  Sodium 0.07 % SOLN Apply 1 drop to eye in the morning, at noon, in the evening, and at bedtime.   Polyethyl Glycol-Propyl Glycol (SYSTANE OP) Apply 1 drop to eye daily.    prednisoLONE  acetate (PRED FORTE ) 1 % ophthalmic suspension Place 1 drop into the right eye 4 (four) times daily.   No current facility-administered medications for this visit. (Ophthalmic Drugs)   Current Outpatient Medications (Other)  Medication Sig   alendronate  (FOSAMAX ) 70 MG tablet Take 1 tablet (70 mg total) by mouth every 7 (seven) days. Take with a full glass of water  on an empty stomach.   Lactase (DAIRY DIGESTIVE PO) Take by mouth.   levothyroxine  (SYNTHROID ) 75 MCG tablet TAKE 1 TABLET BY MOUTH EVERY DAY   omeprazole  (PRILOSEC) 40 MG capsule TAKE 1 CAPSULE BY MOUTH EVERY DAY   OVER THE COUNTER MEDICATION Calcium  chews   rosuvastatin  (CRESTOR ) 10 MG tablet TAKE 1 TABLET BY MOUTH DAILY   traMADol  (ULTRAM ) 50 MG tablet TAKE 1/2 TABLET BY MOUTH IN THE MORNING AND MID AFTERNOON THEN TAKE 1 TABLET BY MOUTH EVERY EVENING   No current facility-administered medications for this visit. (Other)   REVIEW OF SYSTEMS: ROS   Positive for: Endocrine, Eyes Last edited by Resa Delon LELON, COT on 02/13/2023 12:54 PM.      ALLERGIES Allergies  Allergen Reactions   Codeine     Nausea, dizziness   Darvocet [Propoxyphene N-Acetaminophen ]     nausea   Pravachol [Pravastatin Sodium]     Joint pain   PAST MEDICAL HISTORY Past Medical History:  Diagnosis Date   Anxiety    Arthritis    hip   Cataract    hx of   Diverticulosis 2007   History of diverticula   Emphysema lung (HCC) 12/24/2019   GERD (gastroesophageal reflux disease)    Hyperlipidemia    Hypothyroidism    Prediabetes  Past Surgical History:  Procedure Laterality Date   APPENDECTOMY     CATARACT EXTRACTION W/ INTRAOCULAR LENS  IMPLANT, BILATERAL  12/09   Dr Phares bilateral   COLONOSCOPY     DOPPLER ECHOCARDIOGRAPHY  06/2008   Leg   HEMORROIDECTOMY     ML bone density  07/11   TUBAL LIGATION     YAG LASER APPLICATION Right 09/24/2012   Procedure: YAG LASER APPLICATION;  Surgeon: Oneil T. Roz, MD;  Location: [facility];  Service: Ophthalmology;  Laterality: Right;   YAG LASER APPLICATION Left 10/08/2012   Procedure: YAG LASER APPLICATION;  Surgeon: Oneil T. Roz, MD;  Location: [facility];  Service: Ophthalmology;  Laterality: Left;   FAMILY HISTORY Family History  Problem Relation Age of Onset   Esophageal cancer Mother 62       died at 62   Stomach cancer Mother 26        removed 1/2 of stomach, distal esophageal ca   Breast cancer Maternal Aunt    Colon cancer Neg Hx    Rectal cancer Neg Hx     SOCIAL HISTORY Social History   Tobacco Use   Smoking status: Former    Current packs/day: 0.00    Types: Cigarettes    Quit date: 04/24/1983    Years since quitting: 39.8   Smokeless tobacco: Never  Substance Use Topics   Alcohol use: Yes    Alcohol/week: 11.0 standard drinks of alcohol    Types: 4 Glasses of wine, 7 Standard drinks or equivalent per week   Drug use: No         OPHTHALMIC EXAM:  Base Eye Exam     Visual Acuity (Snellen - Linear)       Right Left   Dist cc 20/100 20/30 -1   Dist ph cc 20/70 -2 20/25 -1    Correction: Glasses         Tonometry (Tonopen, 1:06 PM)       Right Left   Pressure 12 11         Pupils       Pupils Dark Light Shape React APD   Right PERRL 3 2 Round Brisk None   Left PERRL 3 2 Round Brisk None         Visual Fields       Left Right    Full Full         Extraocular Movement       Right Left    Full, Ortho Full, Ortho         Neuro/Psych     Oriented x3: Yes   Mood/Affect: Normal           Slit Lamp and Fundus Exam     External Exam       Right Left   External Normal Normal         Slit Lamp Exam       Right Left   Lids/Lashes Dermatochalasis - upper lid, Meibomian gland dysfunction Dermatochalasis - upper lid, Meibomian gland dysfunction   Conjunctiva/Sclera Trace Injection White and quiet   Cornea Arcus, Well healed cataract wound, fine KP inferiorly, corneal haze and scarring, irregular epi inferiorly Arcus, Well healed cataract wound, 1+ Punctate epithelial erosions   Anterior Chamber Deep and clear Deep and clear   Iris Round and poorly dilated, No NVI Round and dilated   Lens MF PC IOL in good postion with open PC Multi focal PC IOL in good postion  with open PC   Anterior Vitreous Vitreous syneresis, mild asteroid hyalosis Vitreous syneresis          Fundus Exam       Right Left   Disc Pink and Sharp, no edema -- disc edema resolved Sharp rim, Trace pallor, mild PPA   C/D Ratio 0.0 0.4   Macula good foveal reflex, central edema -- stably improved, scattered IRH -- improved Flat, good foveal reflex, No heme or edema   Vessels Attenuated, Tortuous, mild copper wiring, AV crossing changes -- CRVO attenuated, Tortuous   Periphery Attached, scattered MA -- improved Attached           Refraction     Wearing Rx       Sphere Cylinder Axis   Right -0.75 +0.75 154   Left -0.50 +1.50 165            IMAGING AND PROCEDURES  Imaging and Procedures for 02/13/2023  OCT, Retina - OU - Both Eyes       Right Eye Quality was borderline. Central Foveal Thickness: 361. Progression has improved. Findings include normal foveal contour, no IRF, no SRF, intraretinal hyper-reflective material (Stable improvement in central edema, foveal contour, disc edema and vitreous opacities).   Left Eye Quality was good. Central Foveal Thickness: 292. Progression has been stable. Findings include normal foveal contour, no IRF, no SRF, vitreomacular adhesion .   Notes *Images captured and stored on drive  Diagnosis / Impression:  OD: CRVO w/ stable improvement in central edema, foveal contour, disc edema and vitreous opacities OS: NFP; no IRF/SRF   Clinical management:  See below  Abbreviations: NFP - Normal foveal profile. CME - cystoid macular edema. PED - pigment epithelial detachment. IRF - intraretinal fluid. SRF - subretinal fluid. EZ - ellipsoid zone. ERM - epiretinal membrane. ORA - outer retinal atrophy. ORT - outer retinal tubulation. SRHM - subretinal hyper-reflective material. IRHM - intraretinal hyper-reflective material      Intravitreal Injection, Pharmacologic Agent - OD - Right Eye       Time Out 02/13/2023. 2:04 PM. Confirmed correct patient, procedure, site, and patient consented.   Anesthesia Topical anesthesia was  used. Anesthetic medications included Lidocaine 2%, Proparacaine 0.5%.   Procedure Preparation included 5% betadine to ocular surface, eyelid speculum. A (32g) needle was used.   Injection: 2 mg aflibercept  2 MG/0.05ML   Route: Intravitreal, Site: Right Eye   NDC: [id number], Lot: [id number], Expiration date: 07/14/2023, Waste: 0 mL   Post-op Post injection exam found visual acuity of at least counting fingers. The patient tolerated the procedure well. There were no complications. The patient received written and verbal post procedure care education.   Notes             ASSESSMENT/PLAN:    ICD-10-CM   1. 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 Hypertensive retinopathy of both eyes  H35.033     3. Essential hypertension  I10     4. Pseudophakia of both eyes  Z96.1     5. Keratopathy  H18.9       1. CRVO w/ CME, OD - previously followed with Dr. Elner - pt reports delayed presentation to Dr. Elner -- had several week history of decreased vision OD prior to presentation - s/p IVA OD  x3 w/ [facility] (last 09.10.24) - s/p retrobulbar kenalog OD x1 - FA (10.07.24) OD: Scattered perivascular leakage and significant leakage peri fovea and disc - s/p IVE OD #1 (10.07.24), #2 (11.04.24), #3 (12.03.24) - history of topical steroids - BCVA OD stable at 20/70  - OCT shows OD: stable improvement in central edema, foveal contour, disc edema and vitreous opacities at 4 wks - recommend IVE OD #4 today, 12.31.24 w/ f/u extended to 6 wks  - RBA of procedure discussed, questions answered - Eylea  informed consent obtained and signed 10.07.24 (OD) - see procedure note - discussed significant inflammation OD - decrease PredForte and Bromfenac  to BID OD  - f/u 4 wks -- DFE/OCT, possible injection    2,3.  Hypertensive  retinopathy OU - discussed importance of tight BP control - monitor  4. Pseudophakia OU  - s/p CE/IOL with Dr. Roz (OU) 2009  - IOL in good position, doing well  - s/p YAG Cap  w/ Dr. Roz (OU) 2014  - monitor  5. Keratopathy OD  - exam shows corneal haze and scarring, early band K and calcification / Salzmann's nodules inferior quadrant  - discussed findings, and likely impact on vision  - ?exacerbation by PredForte and Bromfenac  -- decreasing to BID OD  - will refer to Dr. Meridee for cornea eval and management  Ophthalmic Meds Ordered this visit:  Meds ordered this encounter  Medications   aflibercept  (EYLEA ) SOLN 2 mg     Return in about 6 weeks (around 03/27/2023) for f/u CRVO OD, DFE, OCT.  There are no Patient Instructions on file for this visit.   Explained the diagnoses, plan, and follow up with the patient and they expressed understanding.  Patient expressed understanding of the importance of proper follow up care.   This document serves as a record of services personally performed by Redell JUDITHANN Hans, MD, PhD. It was created on their behalf by Wanda GEANNIE Keens, COT an ophthalmic technician. The creation of this record is the provider's dictation and/or activities during the visit.    Electronically signed by:  Wanda GEANNIE Keens, COT  02/14/23 1:02 AM  This document serves as a record of services personally performed by Redell JUDITHANN Hans, MD, PhD. It was created on their behalf by Alan PARAS. Delores, OA an ophthalmic technician. The creation of this record is the provider's dictation and/or activities during the visit.    Electronically signed by: Alan PARAS. Delores, OA 02/14/23 1:02 AM  Redell JUDITHANN Hans, M.D., Ph.D. Diseases & Surgery of the Retina and Vitreous [facility]  I have reviewed the above documentation for accuracy and completeness, and I agree with the above. Redell JUDITHANN Hans, M.D., Ph.D. 02/14/23 1:02 AM  Abbreviations: M  myopia (nearsighted); A astigmatism; H hyperopia (farsighted); P presbyopia; Mrx spectacle prescription;  CTL contact lenses; OD right eye; OS left eye; OU both eyes  XT exotropia; ET esotropia; PEK punctate epithelial keratitis; PEE punctate epithelial erosions; DES dry eye syndrome; MGD meibomian gland dysfunction; ATs artificial tears; PFAT's preservative free artificial tears; NSC nuclear sclerotic cataract; PSC posterior subcapsular cataract; ERM epi-retinal membrane; PVD posterior vitreous detachment; RD retinal detachment; DM diabetes mellitus; DR diabetic retinopathy; NPDR non-proliferative diabetic retinopathy; PDR proliferative diabetic retinopathy; CSME clinically significant macular edema; DME diabetic macular edema; dbh dot blot hemorrhages; CWS cotton wool spot; POAG primary open angle glaucoma; C/D cup-to-disc ratio; HVF humphrey visual field; GVF goldmann visual field; OCT optical coherence tomography; IOP intraocular pressure;  BRVO Branch retinal vein occlusion; CRVO central retinal vein occlusion; CRAO central retinal artery occlusion; BRAO branch retinal artery occlusion; RT retinal tear; SB scleral buckle; PPV pars plana vitrectomy; VH Vitreous hemorrhage; PRP panretinal laser photocoagulation; IVK intravitreal kenalog; VMT vitreomacular traction; MH Macular hole;  NVD neovascularization of the disc; NVE neovascularization elsewhere; AREDS age related eye disease study; ARMD age related macular degeneration; POAG primary open angle glaucoma; EBMD epithelial/anterior basement membrane dystrophy; ACIOL anterior chamber intraocular lens; IOL intraocular lens; PCIOL posterior chamber intraocular lens; Phaco/IOL phacoemulsification with intraocular lens placement; PRK photorefractive keratectomy; LASIK laser assisted in situ keratomileusis; HTN hypertension; DM diabetes mellitus; COPD chronic obstructive pulmonary disease

## 2023-02-13 ENCOUNTER — Ambulatory Visit (INDEPENDENT_AMBULATORY_CARE_PROVIDER_SITE_OTHER): Payer: PPO | Admitting: Ophthalmology

## 2023-02-13 ENCOUNTER — Encounter (INDEPENDENT_AMBULATORY_CARE_PROVIDER_SITE_OTHER): Payer: Self-pay | Admitting: Ophthalmology

## 2023-02-13 DIAGNOSIS — H189 Unspecified disorder of cornea: Secondary | ICD-10-CM

## 2023-02-13 DIAGNOSIS — H35033 Hypertensive retinopathy, bilateral: Secondary | ICD-10-CM | POA: Diagnosis not present

## 2023-02-13 DIAGNOSIS — I10 Essential (primary) hypertension: Secondary | ICD-10-CM

## 2023-02-13 DIAGNOSIS — Z961 Presence of intraocular lens: Secondary | ICD-10-CM

## 2023-02-13 DIAGNOSIS — H34811 Central retinal vein occlusion, right eye, with macular edema: Secondary | ICD-10-CM | POA: Diagnosis not present

## 2023-02-13 MED ORDER — AFLIBERCEPT 2MG/0.05ML IZ SOLN FOR KALEIDOSCOPE
2.0000 mg | INTRAVITREAL | Status: AC | PRN
  Administered 2023-02-13: 2 mg via INTRAVITREAL

## 2023-02-15 ENCOUNTER — Ambulatory Visit (HOSPITAL_COMMUNITY): Payer: PPO

## 2023-02-19 ENCOUNTER — Ambulatory Visit (HOSPITAL_COMMUNITY)
Admission: RE | Admit: 2023-02-19 | Discharge: 2023-02-19 | Disposition: A | Discharge status: Home / Self Care | Payer: PPO | Source: Ambulatory Visit | Attending: Family Medicine | Admitting: Family Medicine

## 2023-02-19 ENCOUNTER — Encounter (HOSPITAL_COMMUNITY): Payer: Self-pay

## 2023-02-19 DIAGNOSIS — Z1231 Encounter for screening mammogram for malignant neoplasm of breast: Secondary | ICD-10-CM | POA: Insufficient documentation

## 2023-02-28 ENCOUNTER — Other Ambulatory Visit: Payer: Self-pay | Admitting: Family Medicine

## 2023-03-02 DIAGNOSIS — Z79899 Other long term (current) drug therapy: Secondary | ICD-10-CM | POA: Diagnosis not present

## 2023-03-02 DIAGNOSIS — E1169 Type 2 diabetes mellitus with other specified complication: Secondary | ICD-10-CM | POA: Diagnosis not present

## 2023-03-02 DIAGNOSIS — E785 Hyperlipidemia, unspecified: Secondary | ICD-10-CM | POA: Diagnosis not present

## 2023-03-02 DIAGNOSIS — E119 Type 2 diabetes mellitus without complications: Secondary | ICD-10-CM | POA: Diagnosis not present

## 2023-03-03 LAB — BASIC METABOLIC PANEL
BUN/Creatinine Ratio: 18 (ref 12–28)
BUN: 12 mg/dL (ref 8–27)
CO2: 24 mmol/L (ref 20–29)
Calcium: 9.3 mg/dL (ref 8.7–10.3)
Chloride: 104 mmol/L (ref 96–106)
Creatinine, Ser: 0.67 mg/dL (ref 0.57–1.00)
Glucose: 95 mg/dL (ref 70–99)
Potassium: 4.4 mmol/L (ref 3.5–5.2)
Sodium: 143 mmol/L (ref 134–144)
eGFR: 86 mL/min/{1.73_m2} (ref >59)

## 2023-03-03 LAB — LIPID PANEL
Chol/HDL Ratio: 2.5 {ratio} (ref 0.0–4.4)
Cholesterol, Total: 147 mg/dL (ref 100–199)
HDL: 60 mg/dL (ref >39)
LDL Chol Calc (NIH): 71 mg/dL (ref 0–99)
Triglycerides: 83 mg/dL (ref 0–149)
VLDL Cholesterol Cal: 16 mg/dL (ref 5–40)

## 2023-03-03 LAB — HEPATIC FUNCTION PANEL
ALT: 16 [IU]/L (ref 0–32)
AST: 26 [IU]/L (ref 0–40)
Albumin: 4.4 g/dL (ref 3.7–4.7)
Alkaline Phosphatase: 71 [IU]/L (ref 44–121)
Bilirubin Total: 0.4 mg/dL (ref 0.0–1.2)
Bilirubin, Direct: 0.16 mg/dL (ref 0.00–0.40)
Total Protein: 7 g/dL (ref 6.0–8.5)

## 2023-03-03 LAB — MICROALBUMIN / CREATININE URINE RATIO
Creatinine, Urine: 21.6 mg/dL
Microalb/Creat Ratio: <14 mg/g{creat} (ref 0–29)
Microalbumin, Urine: <3 ug/mL

## 2023-03-03 LAB — HEMOGLOBIN A1C
Est. average glucose Bld gHb Est-mCnc: 137 mg/dL
Hgb A1c MFr Bld: 6.4 % — ABNORMAL HIGH (ref 4.8–5.6)

## 2023-03-04 ENCOUNTER — Encounter: Payer: Self-pay | Admitting: Family Medicine

## 2023-03-08 ENCOUNTER — Ambulatory Visit: Payer: PPO | Admitting: Family Medicine

## 2023-03-08 ENCOUNTER — Encounter: Payer: Self-pay | Admitting: Family Medicine

## 2023-03-08 VITALS — BP 137/81 | HR 70 | Temp 98.1°F | Ht 61.0 in | Wt 122.0 lb

## 2023-03-08 DIAGNOSIS — I700 Atherosclerosis of aorta: Secondary | ICD-10-CM

## 2023-03-08 DIAGNOSIS — E038 Other specified hypothyroidism: Secondary | ICD-10-CM

## 2023-03-08 DIAGNOSIS — E1169 Type 2 diabetes mellitus with other specified complication: Secondary | ICD-10-CM

## 2023-03-08 DIAGNOSIS — Z23 Encounter for immunization: Secondary | ICD-10-CM

## 2023-03-08 DIAGNOSIS — H34811 Central retinal vein occlusion, right eye, with macular edema: Secondary | ICD-10-CM | POA: Diagnosis not present

## 2023-03-08 DIAGNOSIS — E785 Hyperlipidemia, unspecified: Secondary | ICD-10-CM

## 2023-03-08 DIAGNOSIS — J432 Centrilobular emphysema: Secondary | ICD-10-CM | POA: Diagnosis not present

## 2023-03-08 DIAGNOSIS — R634 Abnormal weight loss: Secondary | ICD-10-CM

## 2023-03-08 DIAGNOSIS — E119 Type 2 diabetes mellitus without complications: Secondary | ICD-10-CM

## 2023-03-08 NOTE — Progress Notes (Signed)
   Subjective:    Patient ID: Jordan Grant, female    DOB: April 11, 1940, 83 y.o.   MRN: [id number]  HPI follow up  blood work results  Discussed the use of AI scribe software for clinical note transcription with the patient, who gave verbal consent to proceed.  History of Present Illness   The patient, with a history of multiple health conditions, has been mindful of her eating habits, focusing on both the quantity and quality of food. She has been struggling with reduced physical activity due to cold weather, managing to walk only once a week. The patient has also been experiencing what she describes as 'winter blahs', which has led to increased sedentary behavior.  The patient has noticed a decrease in weight, currently at 122 pounds, down from previous weights of around 134-136 pounds. She has been trying to maintain a low-fat diet, including salads, cereals, and low-fat foods. She has also been experiencing shortness of breath, particularly when walking up steps. Despite these challenges, the patient has been trying to maintain an active lifestyle.  She denies dysuria and hematuria.  The patient has also reported right shoulder pain following a recent fall.  In addition, the patient has been experiencing itching and numbness in three toes on her left foot. The symptoms started about a year ago with itching, which has recently progressed to numbness. The patient has not noticed any rash associated with the itching. The patient has also reported leg pain, particularly at night, which she manages with half a tablet of tramadol twice a day.  She denies any new medications. She has also been taking calcium supplements and has been trying to maintain a regular sleep schedule. Despite these challenges, the patient has been trying to maintain a positive outlook and has expressed a desire to increase her physical activity and improve her diet.       Review of Systems     Objective:   Physical  Exam General-in no acute distress Eyes-no discharge Lungs-respiratory rate normal, CTA CV-no murmurs,RRR Extremities skin warm dry no edema Neuro grossly normal Behavior normal, alert        Assessment & Plan:  Assessment and Plan      Patient is hoping that she can increase her physical activity as the weather gets better She has had weight loss but it is mainly because that she is being very careful with her eating habits which is a good thing but I did tell her that she does not need to lose more weight and ideally if she can get her weight between 125 and 130 that would be better Her diabetes is doing better she is doing a better job watching starches A1c under reasonable control do not need to add any medicines She also has some 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

## 2023-03-09 ENCOUNTER — Other Ambulatory Visit: Payer: Self-pay

## 2023-03-09 DIAGNOSIS — E119 Type 2 diabetes mellitus without complications: Secondary | ICD-10-CM

## 2023-03-09 DIAGNOSIS — E038 Other specified hypothyroidism: Secondary | ICD-10-CM

## 2023-03-09 DIAGNOSIS — E1169 Type 2 diabetes mellitus with other specified complication: Secondary | ICD-10-CM

## 2023-03-09 DIAGNOSIS — G609 Hereditary and idiopathic neuropathy, unspecified: Secondary | ICD-10-CM

## 2023-03-15 NOTE — Progress Notes (Shared)
[facility] - Clinic Note  03/27/2023     CHIEF COMPLAINT Patient presents for No chief complaint on file.   HISTORY OF PRESENT ILLNESS: Jordan Grant is a 83 y.o. female who presents to the clinic today for:     Patient states vision is okay  Referring physician: Babs Sciara, MD [address] [place],  [place] [address]  HISTORICAL INFORMATION:   Selected notes from the MEDICAL RECORD NUMBER Referred by Dr. Gerda Diss LEE: Dr. Luciana Axe 9.10.24 - IVA OD #3 Ocular Hx- CRVO w/ CME OD -- s/p retrobulbar kenalog x1, IVA OD x3 PMH-    CURRENT MEDICATIONS: Current Outpatient Medications (Ophthalmic Drugs)  Medication Sig   azelastine (OPTIVAR) 0.05 % ophthalmic solution Place 1 drop into the right eye 2 (two) times daily.   Bromfenac Sodium (PROLENSA) 0.07 % SOLN Apply 1 drop to eye in the morning, at noon, in the evening, and at bedtime.   Bromfenac Sodium 0.07 % SOLN Apply 1 drop to eye in the morning, at noon, in the evening, and at bedtime.   Polyethyl Glycol-Propyl Glycol (SYSTANE OP) Apply 1 drop to eye daily.    prednisoLONE acetate (PRED FORTE) 1 % ophthalmic suspension Place 1 drop into the right eye 4 (four) times daily.   No current facility-administered medications for this visit. (Ophthalmic Drugs)   Current Outpatient Medications (Other)  Medication Sig   alendronate (FOSAMAX) 70 MG tablet Take 1 tablet (70 mg total) by mouth every 7 (seven) days. Take with a full glass of water on an empty stomach.   levothyroxine (SYNTHROID) 75 MCG tablet TAKE 1 TABLET BY MOUTH EVERY DAY   omeprazole (PRILOSEC) 40 MG capsule TAKE 1 CAPSULE BY MOUTH EVERY DAY   OVER THE COUNTER MEDICATION Calcium chews   rosuvastatin (CRESTOR) 10 MG tablet TAKE 1 TABLET BY MOUTH DAILY   traMADol (ULTRAM) 50 MG tablet TAKE 1/2 TABLET BY MOUTH IN THE MORNING AND MID AFTERNOON THEN TAKE 1 TABLET BY MOUTH EVERY EVENING   No current facility-administered medications  for this visit. (Other)   REVIEW OF SYSTEMS:    ALLERGIES Allergies  Allergen Reactions   Codeine     Nausea, dizziness   Darvocet [Propoxyphene N-Acetaminophen]     nausea   Pravachol [Pravastatin Sodium]     Joint pain   PAST MEDICAL HISTORY Past Medical History:  Diagnosis Date   Anxiety    Arthritis    hip   Cataract    hx of   Diverticulosis 2007   History of diverticula   Emphysema lung (HCC) 12/24/2019   GERD (gastroesophageal reflux disease)    Hyperlipidemia    Hypothyroidism    Prediabetes    Past Surgical History:  Procedure Laterality Date   APPENDECTOMY     breast lift Bilateral    CATARACT EXTRACTION W/ INTRAOCULAR LENS  IMPLANT, BILATERAL  01/14/2008   Dr Micheal Likens bilateral   COLONOSCOPY     DOPPLER ECHOCARDIOGRAPHY  06/13/2008   Leg   HEMORROIDECTOMY     ML bone density  08/13/2009   TUBAL LIGATION     YAG LASER APPLICATION Right 09/24/2012   Procedure: YAG LASER APPLICATION;  Surgeon: Loraine Leriche T. Nile Riggs, MD;  Location: [facility];  Service: Ophthalmology;  Laterality: Right;   YAG LASER APPLICATION Left 10/08/2012   Procedure: YAG LASER APPLICATION;  Surgeon: Loraine Leriche T. Nile Riggs, MD;  Location: [facility];  Service: Ophthalmology;  Laterality: Left;   FAMILY HISTORY Family History  Problem Relation Age of Onset   Esophageal cancer Mother 76       died at 32   Stomach cancer Mother 31       removed 1/2 of stomach, distal esophageal ca   Breast cancer Maternal Aunt    Colon cancer Neg Hx    Rectal cancer Neg Hx     SOCIAL HISTORY Social History   Tobacco Use   Smoking status: Former    Current packs/day: 0.00    Types: Cigarettes    Quit date: 04/24/1983    Years since quitting: 39.9   Smokeless tobacco: Never  Substance Use Topics   Alcohol use: Yes    Alcohol/week: 11.0 standard drinks of alcohol    Types: 4 Glasses of wine, 7 Standard drinks or equivalent per week   Drug use: No         OPHTHALMIC EXAM:  Not recorded     IMAGING  AND PROCEDURES  Imaging and Procedures for 03/27/2023          ASSESSMENT/PLAN:    ICD-10-CM   1. 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 Hypertensive retinopathy of both eyes  H35.033     3. Essential hypertension  I10     4. Pseudophakia of both eyes  Z96.1     5. Keratopathy  H18.9        1. CRVO w/ CME, OD - previously followed with Dr. Luciana Axe - pt reports delayed presentation to Dr. Luciana Axe -- had several week history of decreased vision OD prior to presentation - s/p IVA OD x3 w/ [facility] (last 09.10.24) - s/p retrobulbar kenalog OD x1 - FA (10.07.24) OD: Scattered perivascular leakage and significant leakage peri fovea and disc - s/p IVE OD #1 (10.07.24), #2 (11.04.24), #3 (12.03.24), #4 (12.31.24) - history of topical steroids - BCVA OD stable at 20/70  - OCT shows OD: stable improvement in central edema, foveal contour, disc edema and vitreous opacities at 4 wks - recommend IVE OD #5 today, 02.11.25 w/ f/u extended to 6 wks  - RBA of procedure discussed, questions answered - Eylea informed consent obtained and signed 10.07.24 (OD) - see procedure note - discussed significant inflammation OD - decrease PredForte and Bromfenac to BID OD  - f/u 4 wks -- DFE/OCT, possible injection    2,3.  Hypertensive retinopathy OU - discussed importance of tight BP control - monitor  4. Pseudophakia OU  - s/p CE/IOL with Dr. Nile Riggs (OU) 2009  - IOL in good position, doing well  - s/p YAG Cap  w/ Dr. Nile Riggs (OU) 2014  - monitor  5. Keratopathy OD  - exam shows corneal haze and scarring, early band K and calcification / Salzmann's nodules inferior quadrant  - discussed findings, and likely impact on vision  - ?exacerbation by PredForte and Bromfenac -- decreasing to BID OD  - will refer to Dr. Delaney Meigs for cornea eval and management  Ophthalmic Meds Ordered this visit:  No orders of the defined types were placed in this  encounter.    No follow-ups on file.  There are no Patient Instructions on file for this visit.   Explained the diagnoses, plan, and follow up with the patient and they expressed understanding.  Patient expressed understanding of the importance of proper follow up care.   This document serves as a record of services personally performed by Karie Chimera, MD, PhD. It was created on their  behalf by Charlette Caffey, COT an ophthalmic technician. The creation of this record is the provider's dictation and/or activities during the visit.    Electronically signed by:  Charlette Caffey, COT  03/15/23 10:04 AM    Karie Chimera, M.D., Ph.D. Diseases & Surgery of the Retina and Vitreous [facility]    Abbreviations: M myopia (nearsighted); A astigmatism; H hyperopia (farsighted); P presbyopia; Mrx spectacle prescription;  CTL contact lenses; OD right eye; OS left eye; OU both eyes  XT exotropia; ET esotropia; PEK punctate epithelial keratitis; PEE punctate epithelial erosions; DES dry eye syndrome; MGD meibomian gland dysfunction; ATs artificial tears; PFAT's preservative free artificial tears; NSC nuclear sclerotic cataract; PSC posterior subcapsular cataract; ERM epi-retinal membrane; PVD posterior vitreous detachment; RD retinal detachment; DM diabetes mellitus; DR diabetic retinopathy; NPDR non-proliferative diabetic retinopathy; PDR proliferative diabetic retinopathy; CSME clinically significant macular edema; DME diabetic macular edema; dbh dot blot hemorrhages; CWS cotton wool spot; POAG primary open angle glaucoma; C/D cup-to-disc ratio; HVF humphrey visual field; GVF goldmann visual field; OCT optical coherence tomography; IOP intraocular pressure; BRVO Branch retinal vein occlusion; CRVO central retinal vein occlusion; CRAO central retinal artery occlusion; BRAO branch retinal artery occlusion; RT retinal tear; SB scleral buckle; PPV pars plana vitrectomy; VH  Vitreous hemorrhage; PRP panretinal laser photocoagulation; IVK intravitreal kenalog; VMT vitreomacular traction; MH Macular hole;  NVD neovascularization of the disc; NVE neovascularization elsewhere; AREDS age related eye disease study; ARMD age related macular degeneration; POAG primary open angle glaucoma; EBMD epithelial/anterior basement membrane dystrophy; ACIOL anterior chamber intraocular lens; IOL intraocular lens; PCIOL posterior chamber intraocular lens; Phaco/IOL phacoemulsification with intraocular lens placement; PRK photorefractive keratectomy; LASIK laser assisted in situ keratomileusis; HTN hypertension; DM diabetes mellitus; COPD chronic obstructive pulmonary disease

## 2023-03-20 ENCOUNTER — Ambulatory Visit (INDEPENDENT_AMBULATORY_CARE_PROVIDER_SITE_OTHER): Payer: PPO | Admitting: Orthopedic Surgery

## 2023-03-20 VITALS — Ht 61.0 in | Wt 121.8 lb

## 2023-03-20 DIAGNOSIS — M7581 Other shoulder lesions, right shoulder: Secondary | ICD-10-CM

## 2023-03-20 NOTE — Progress Notes (Signed)
 Return patient Visit  Assessment: Jordan Grant is a 83 y.o. female with the following: 1.  Right shoulder, rotator cuff tendinitis  Plan: [place] has had recurrence of pain in her right shoulder.  Injections have been beneficial.  She would like to repeat an injection in clinic today.  This was completed.  There were no issues.  Contact the clinic with issues.  She will follow-up as needed.  Procedure note injection - Right shoulder    Verbal consent was obtained to inject the right shoulder, subacromial space Timeout was completed to confirm the site of injection.   The skin was prepped with alcohol and ethyl chloride was sprayed at the injection site.  A 21-gauge needle was used to inject 40 mg of Depo-Medrol  and 1% lidocaine (3 cc) into the subacromial space of the right shoulder using a posterolateral approach.  There were no complications.  A sterile bandage was applied.    Follow-up: Return if symptoms worsen or fail to improve.  Subjective:  Chief Complaint  Patient presents with   Injections    R shoulder pain back over past mo. Pt states she's been lifting her grand baby lately and thinks that may have irritated it.     History of Present Illness: Jordan Grant is a 83 y.o. female who returns to clinic today for repeat evaluation of her right shoulder.  I have not seen her in clinic for over 6 months.  Most recent injection was very effective.  She states that her right shoulder started to bother her about a month ago.  She has been taking care of a newborn baby.  No specific injury or episode.  Pain is in the posterior and lateral aspect of the right shoulder.  Radiating distally towards the elbow.  Review of Systems: No fevers or chills No numbness or tingling No chest pain No shortness of breath No bowel or bladder dysfunction No GI distress No headaches   Objective: Ht 5' 1 (1.549 m)   Wt 121 lb 12.8 oz (55.2 kg)   BMI 23.01 kg/m    Physical Exam:  General: Alert and oriented. and No acute distress. Gait: Normal gait.  Right shoulder without deformity.  No redness.  No swelling.  Near full forward flexion with some discomfort.  In the 90/90 position, she has pain in the posterior aspect of the shoulder with internal rotation.  Tenderness to palpation over the posterior shoulder.  External rotation is similar to the contralateral side.  Fingers warm and well-perfused.  Sensation intact throughout the right hand.  IMAGING: I personally ordered and reviewed the following images  No new imaging obtained today.  New Medications:  No orders of the defined types were placed in this encounter.     Jordan DELENA Horde, MD  03/20/2023 8:46 AM

## 2023-03-20 NOTE — Patient Instructions (Signed)

## 2023-03-23 ENCOUNTER — Other Ambulatory Visit: Payer: Self-pay

## 2023-03-23 ENCOUNTER — Ambulatory Visit: Payer: Self-pay | Admitting: Family Medicine

## 2023-03-23 ENCOUNTER — Telehealth: Payer: Self-pay | Admitting: Family Medicine

## 2023-03-23 MED ORDER — LEVOTHYROXINE SODIUM 75 MCG PO TABS: 75.0000 ug | ORAL_TABLET | Freq: Every day | ORAL | 1 refills | Status: DC

## 2023-03-23 NOTE — Telephone Encounter (Signed)
 Copied from CRM [id number]. Topic: Clinical - Prescription Issue >> Mar 23, 2023 11:30 AM Elle L wrote: Reason for CRM: The patient sent a request for a refill of levothyroxine  (SYNTHROID ) 75 MCG tablet. However, she would like to switch that prescription and all future prescriptions to the [facility] Address: [address] [place], [place] [address] Phone: [phone].

## 2023-03-23 NOTE — Telephone Encounter (Signed)
 Refill on    levothyroxine  (SYNTHROID ) 75 MCG tablet  Send to [facility] [address]

## 2023-03-27 ENCOUNTER — Encounter (INDEPENDENT_AMBULATORY_CARE_PROVIDER_SITE_OTHER): Payer: PPO | Admitting: Ophthalmology

## 2023-03-27 ENCOUNTER — Other Ambulatory Visit (INDEPENDENT_AMBULATORY_CARE_PROVIDER_SITE_OTHER): Payer: Self-pay

## 2023-03-27 DIAGNOSIS — Z961 Presence of intraocular lens: Secondary | ICD-10-CM

## 2023-03-27 DIAGNOSIS — I10 Essential (primary) hypertension: Secondary | ICD-10-CM

## 2023-03-27 DIAGNOSIS — H189 Unspecified disorder of cornea: Secondary | ICD-10-CM

## 2023-03-27 DIAGNOSIS — H35033 Hypertensive retinopathy, bilateral: Secondary | ICD-10-CM

## 2023-03-27 DIAGNOSIS — H34811 Central retinal vein occlusion, right eye, with macular edema: Secondary | ICD-10-CM

## 2023-03-27 MED ORDER — PREDNISOLONE ACETATE 1 % OP SUSP: 1.0000 [drp] | Freq: Two times a day (BID) | OPHTHALMIC | 3 refills | Status: AC

## 2023-03-27 MED ORDER — BROMFENAC SODIUM 0.07 % OP SOLN: 1.0000 [drp] | Freq: Two times a day (BID) | OPHTHALMIC | 6 refills | Status: DC

## 2023-03-28 NOTE — Progress Notes (Signed)
 [facility] - Clinic Note  03/30/2023     CHIEF COMPLAINT Patient presents for Retina Follow Up   HISTORY OF PRESENT ILLNESS: Jordan Grant is a 83 y.o. female who presents to the clinic today for:   HPI     Retina Follow Up   Patient presents with  CRVO/BRVO.  In right eye.  Severity is moderate.  Duration of 6 weeks.  Since onset it is stable.  I, the attending physician,  performed the HPI with the patient and updated documentation appropriately.        Comments   Patient states some days vision seems improved OD. Using Pred Forte and bromfenac bid OD.      Last edited by Rennis Chris, MD on 03/30/2023 11:55 PM.    Patient is taking PF and bromfenac BID OD, she has an appt with her optometrist in April, she got a new Rx from him in October, but has not gotten it filled yet, pt did not go to her appt with Dr. Delaney Meigs on Wednesday bc she has been very sick, she states she called to r/s it and they sent her to another dr  Referring physician: Babs Sciara, MD [address] [place],  [place] [address]  HISTORICAL INFORMATION:   Selected notes from the MEDICAL RECORD NUMBER Referred by Dr. Gerda Diss LEE: Dr. Luciana Axe 9.10.24 - IVA OD #3 Ocular Hx- CRVO w/ CME OD -- s/p retrobulbar kenalog x1, IVA OD x3 PMH-    CURRENT MEDICATIONS: Current Outpatient Medications (Ophthalmic Drugs)  Medication Sig   azelastine (OPTIVAR) 0.05 % ophthalmic solution Place 1 drop into the right eye 2 (two) times daily.   Bromfenac Sodium (PROLENSA) 0.07 % SOLN Apply 1 drop to eye 2 (two) times daily.   Bromfenac Sodium 0.07 % SOLN Apply 1 drop to eye in the morning, at noon, in the evening, and at bedtime.   Polyethyl Glycol-Propyl Glycol (SYSTANE OP) Apply 1 drop to eye daily.    prednisoLONE acetate (PRED FORTE) 1 % ophthalmic suspension Place 1 drop into the right eye 2 (two) times daily.   No current facility-administered medications for this visit.  (Ophthalmic Drugs)   Current Outpatient Medications (Other)  Medication Sig   alendronate (FOSAMAX) 70 MG tablet Take 1 tablet (70 mg total) by mouth every 7 (seven) days. Take with a full glass of water on an empty stomach.   levothyroxine (SYNTHROID) 75 MCG tablet Take 1 tablet (75 mcg total) by mouth daily.   omeprazole (PRILOSEC) 40 MG capsule TAKE 1 CAPSULE BY MOUTH EVERY DAY   OVER THE COUNTER MEDICATION Calcium chews   rosuvastatin (CRESTOR) 10 MG tablet TAKE 1 TABLET BY MOUTH DAILY   traMADol (ULTRAM) 50 MG tablet TAKE 1/2 TABLET BY MOUTH IN THE MORNING AND MID AFTERNOON THEN TAKE 1 TABLET BY MOUTH EVERY EVENING   No current facility-administered medications for this visit. (Other)   REVIEW OF SYSTEMS: ROS   Positive for: Endocrine, Eyes Negative for: Constitutional, Gastrointestinal, Neurological, Skin, Genitourinary, Musculoskeletal, HENT, Cardiovascular, Respiratory, Psychiatric, Allergic/Imm, Heme/Lymph Last edited by Doreene Nest, COT on 03/30/2023 12:45 PM.       ALLERGIES Allergies  Allergen Reactions   Codeine     Nausea, dizziness   Darvocet [Propoxyphene N-Acetaminophen]     nausea   Pravachol [Pravastatin Sodium]     Joint pain   PAST MEDICAL HISTORY Past Medical History:  Diagnosis Date   Anxiety    Arthritis  hip   Cataract    hx of   Diverticulosis 2007   History of diverticula   Emphysema lung (HCC) 12/24/2019   GERD (gastroesophageal reflux disease)    Hyperlipidemia    Hypothyroidism    Prediabetes    Past Surgical History:  Procedure Laterality Date   APPENDECTOMY     breast lift Bilateral    CATARACT EXTRACTION W/ INTRAOCULAR LENS  IMPLANT, BILATERAL  01/14/2008   Dr Micheal Likens bilateral   COLONOSCOPY     DOPPLER ECHOCARDIOGRAPHY  06/13/2008   Leg   HEMORROIDECTOMY     ML bone density  08/13/2009   TUBAL LIGATION     YAG LASER APPLICATION Right 09/24/2012   Procedure: YAG LASER APPLICATION;  Surgeon: Loraine Leriche T. Nile Riggs, MD;   Location: [facility];  Service: Ophthalmology;  Laterality: Right;   YAG LASER APPLICATION Left 10/08/2012   Procedure: YAG LASER APPLICATION;  Surgeon: Loraine Leriche T. Nile Riggs, MD;  Location: [facility];  Service: Ophthalmology;  Laterality: Left;   FAMILY HISTORY Family History  Problem Relation Age of Onset   Esophageal cancer Mother 44       died at 83   Stomach cancer Mother 14       removed 1/2 of stomach, distal esophageal ca   Breast cancer Maternal Aunt    Colon cancer Neg Hx    Rectal cancer Neg Hx     SOCIAL HISTORY Social History   Tobacco Use   Smoking status: Former    Current packs/day: 0.00    Types: Cigarettes    Quit date: 04/24/1983    Years since quitting: 39.9   Smokeless tobacco: Never  Substance Use Topics   Alcohol use: Yes    Alcohol/week: 11.0 standard drinks of alcohol    Types: 4 Glasses of wine, 7 Standard drinks or equivalent per week   Drug use: No         OPHTHALMIC EXAM:  Base Eye Exam     Visual Acuity (Snellen - Linear)       Right Left   Dist cc 20/80 -2 20/20 -1   Dist ph cc 20/50 -2     Correction: Glasses         Tonometry (Tonopen, 12:51 PM)       Right Left   Pressure 12 10         Pupils       Dark Light Shape React APD   Right 3 2 Round Brisk None   Left 3 2 Round Brisk None         Visual Fields (Counting fingers)       Left Right    Full Full         Extraocular Movement       Right Left    Full, Ortho Full, Ortho         Neuro/Psych     Oriented x3: Yes   Mood/Affect: Normal         Dilation     Both eyes: 1.0% Mydriacyl, 2.5% Phenylephrine @ 12:51 PM           Slit Lamp and Fundus Exam     External Exam       Right Left   External Normal Normal         Slit Lamp Exam       Right Left   Lids/Lashes Dermatochalasis - upper lid, Meibomian gland dysfunction Dermatochalasis - upper lid, Meibomian gland dysfunction   Conjunctiva/Sclera Trace  Injection White and quiet   Cornea  Arcus, Well healed cataract wound, fine KP inferiorly, corneal haze and scarring, irregular epi inferiorly Arcus, Well healed cataract wound, 1+ Punctate epithelial erosions   Anterior Chamber Deep and clear Deep and clear   Iris Round and poorly dilated, No NVI Round and dilated   Lens MF PC IOL in good postion with open PC Multi focal PC IOL in good postion with open PC   Anterior Vitreous Vitreous syneresis, mild asteroid hyalosis Vitreous syneresis         Fundus Exam       Right Left   Disc Pink and Sharp, no edema -- disc edema resolved Sharp rim, Trace pallor, mild PPA   C/D Ratio 0.2 0.4   Macula good foveal reflex, central edema -- stably improved, scattered IRH -- improved Flat, good foveal reflex, No heme or edema   Vessels Attenuated, Tortuous, mild copper wiring, AV crossing changes -- CRVO attenuated, Tortuous   Periphery Attached, scattered MA -- improved Attached           Refraction     Wearing Rx       Sphere Cylinder Axis   Right -0.75 +0.75 154   Left -0.50 +1.50 165            IMAGING AND PROCEDURES  Imaging and Procedures for 03/30/2023  OCT, Retina - OU - Both Eyes       Right Eye Quality was borderline. Central Foveal Thickness: 361. Progression has improved. Findings include normal foveal contour, no IRF, no SRF, intraretinal hyper-reflective material (Stable improvement in central edema, foveal contour, disc edema and vitreous opacities).   Left Eye Quality was good. Central Foveal Thickness: 286. Progression has been stable. Findings include normal foveal contour, no IRF, no SRF, vitreomacular adhesion .   Notes *Images captured and stored on drive  Diagnosis / Impression:  OD: CRVO w/ stable improvement in central edema, foveal contour, disc edema and vitreous opacities OS: NFP; no IRF/SRF   Clinical management:  See below  Abbreviations: NFP - Normal foveal profile. CME - cystoid macular edema. PED - pigment epithelial detachment.  IRF - intraretinal fluid. SRF - subretinal fluid. EZ - ellipsoid zone. ERM - epiretinal membrane. ORA - outer retinal atrophy. ORT - outer retinal tubulation. SRHM - subretinal hyper-reflective material. IRHM - intraretinal hyper-reflective material      Intravitreal Injection, Pharmacologic Agent - OD - Right Eye       Time Out 03/30/2023. 1:38 PM. Confirmed correct patient, procedure, site, and patient consented.   Anesthesia Topical anesthesia was used. Anesthetic medications included Lidocaine 2%, Proparacaine 0.5%.   Procedure Preparation included 5% betadine to ocular surface, eyelid speculum. A (32 g) needle was used.   Injection: 2 mg aflibercept 2 MG/0.05ML   Route: Intravitreal, Site: Right Eye   NDC: L6038910, Lot: [id number], Expiration date: 07/13/2024, Waste: 0 mL   Post-op Post injection exam found visual acuity of at least counting fingers. The patient tolerated the procedure well. There were no complications. The patient received written and verbal post procedure care education.            ASSESSMENT/PLAN:    ICD-10-CM   1. 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 Hypertensive retinopathy of both eyes  H35.033  3. Essential hypertension  I10     4. Pseudophakia of both eyes  Z96.1     5. Keratopathy  H18.9      1. CRVO w/ CME, OD - previously followed with Dr. Luciana Axe - pt reports delayed presentation to Dr. Luciana Axe -- had several week history of decreased vision OD prior to presentation - s/p IVA OD x3 w/ [facility] (last 09.10.24) - s/p retrobulbar kenalog OD x1 - FA (10.07.24) OD: Scattered perivascular leakage and significant leakage peri fovea and disc - s/p IVE OD #1 (10.07.24), #2 (11.04.24), #3 (12.03.24), #4 (12.31.24) - history of topical steroids - BCVA OD stable at 20/70  -  OCT shows OD: stable improvement in central edema, foveal contour, disc edema and vitreous opacities at 6 wks - recommend IVE OD #5 today, 2.14.25 w/ f/u extended to 8 wks  - RBA of procedure discussed, questions answered - Eylea informed consent obtained and signed 10.07.24 (OD) - see procedure note - [employer] funding unavailable -- pt covering 20% coinsurance on medication - discussed significant inflammation OD - cont PredForte and Bromfenac to BID OD  - f/u 8 weeks -- DFE/OCT, possible injection    2,3.  Hypertensive retinopathy OU - discussed importance of tight BP control - monitor  4. Pseudophakia OU  - s/p CE/IOL with Dr. Nile Riggs (OU) 2009  - IOL in good position, doing well  - s/p YAG Cap  w/ Dr. Nile Riggs (OU) 2014  - monitor  5. Keratopathy OD  - exam shows corneal haze and scarring, early band K and calcification / Salzmann's nodules inferior quadrant  - discussed findings, and likely impact on vision  - ?exacerbation by PredForte and Bromfenac QID -- decreased to BID OD  - referred to Dr. Delaney Meigs for cornea eval and management -- consult pending  Ophthalmic Meds Ordered this visit:  Meds ordered this encounter  Medications   aflibercept (EYLEA) SOLN 2 mg     Return in about 8 weeks (around 05/25/2023) for f/u CRVO OD, DFE, OCT, Possible Injxn.  There are no Patient Instructions on file for this visit.   This document serves as a record of services personally performed by Karie Chimera, MD, PhD. It was created on their behalf by Berlin Hun COT, an ophthalmic technician. The creation of this record is the provider's dictation and/or activities during the visit.    Electronically signed by: Berlin Hun COT 02.12.25 12:55 AM  This document serves as a record of services personally performed by Karie Chimera, MD, PhD. It was created on their behalf by Glee Arvin. Manson Passey, OA an ophthalmic technician. The creation of this record is the provider's  dictation and/or activities during the visit.    Electronically signed by: Glee Arvin. Manson Passey, OA 03/31/23 12:55 AM  Karie Chimera, M.D., Ph.D. Diseases & Surgery of the Retina and Vitreous [facility] 03/30/2023   I have reviewed the above documentation for accuracy and completeness, and I agree with the above. Karie Chimera, M.D., Ph.D. 03/31/23 12:58 AM  Abbreviations: M myopia (nearsighted); A astigmatism; H hyperopia (farsighted); P presbyopia; Mrx spectacle prescription;  CTL contact lenses; OD right eye; OS left eye; OU both eyes  XT exotropia; ET esotropia; PEK punctate epithelial keratitis; PEE punctate epithelial erosions; DES dry eye syndrome; MGD meibomian gland dysfunction; ATs artificial tears; PFAT's preservative free artificial tears; NSC nuclear sclerotic cataract; PSC posterior subcapsular cataract; ERM epi-retinal membrane; PVD posterior vitreous detachment; RD retinal detachment; DM  diabetes mellitus; DR diabetic retinopathy; NPDR non-proliferative diabetic retinopathy; PDR proliferative diabetic retinopathy; CSME clinically significant macular edema; DME diabetic macular edema; dbh dot blot hemorrhages; CWS cotton wool spot; POAG primary open angle glaucoma; C/D cup-to-disc ratio; HVF humphrey visual field; GVF goldmann visual field; OCT optical coherence tomography; IOP intraocular pressure; BRVO Branch retinal vein occlusion; CRVO central retinal vein occlusion; CRAO central retinal artery occlusion; BRAO branch retinal artery occlusion; RT retinal tear; SB scleral buckle; PPV pars plana vitrectomy; VH Vitreous hemorrhage; PRP panretinal laser photocoagulation; IVK intravitreal kenalog; VMT vitreomacular traction; MH Macular hole;  NVD neovascularization of the disc; NVE neovascularization elsewhere; AREDS age related eye disease study; ARMD age related macular degeneration; POAG primary open angle glaucoma; EBMD epithelial/anterior basement membrane  dystrophy; ACIOL anterior chamber intraocular lens; IOL intraocular lens; PCIOL posterior chamber intraocular lens; Phaco/IOL phacoemulsification with intraocular lens placement; PRK photorefractive keratectomy; LASIK laser assisted in situ keratomileusis; HTN hypertension; DM diabetes mellitus; COPD chronic obstructive pulmonary disease

## 2023-03-30 ENCOUNTER — Ambulatory Visit (INDEPENDENT_AMBULATORY_CARE_PROVIDER_SITE_OTHER): Payer: PPO | Admitting: Ophthalmology

## 2023-03-30 ENCOUNTER — Encounter (INDEPENDENT_AMBULATORY_CARE_PROVIDER_SITE_OTHER): Payer: Self-pay | Admitting: Ophthalmology

## 2023-03-30 DIAGNOSIS — H189 Unspecified disorder of cornea: Secondary | ICD-10-CM | POA: Diagnosis not present

## 2023-03-30 DIAGNOSIS — H34811 Central retinal vein occlusion, right eye, with macular edema: Secondary | ICD-10-CM | POA: Diagnosis not present

## 2023-03-30 DIAGNOSIS — Z961 Presence of intraocular lens: Secondary | ICD-10-CM | POA: Diagnosis not present

## 2023-03-30 DIAGNOSIS — I10 Essential (primary) hypertension: Secondary | ICD-10-CM | POA: Diagnosis not present

## 2023-03-30 DIAGNOSIS — H35033 Hypertensive retinopathy, bilateral: Secondary | ICD-10-CM | POA: Diagnosis not present

## 2023-03-30 MED ORDER — AFLIBERCEPT 2MG/0.05ML IZ SOLN FOR KALEIDOSCOPE
2.0000 mg | INTRAVITREAL | Status: AC | PRN
  Administered 2023-03-30: 2 mg via INTRAVITREAL

## 2023-04-30 DIAGNOSIS — Z961 Presence of intraocular lens: Secondary | ICD-10-CM | POA: Diagnosis not present

## 2023-04-30 DIAGNOSIS — H18421 Band keratopathy, right eye: Secondary | ICD-10-CM | POA: Diagnosis not present

## 2023-04-30 DIAGNOSIS — H34811 Central retinal vein occlusion, right eye, with macular edema: Secondary | ICD-10-CM | POA: Diagnosis not present

## 2023-04-30 DIAGNOSIS — H18413 Arcus senilis, bilateral: Secondary | ICD-10-CM | POA: Diagnosis not present

## 2023-05-25 ENCOUNTER — Ambulatory Visit (INDEPENDENT_AMBULATORY_CARE_PROVIDER_SITE_OTHER): Payer: PPO | Admitting: Ophthalmology

## 2023-05-25 ENCOUNTER — Encounter (INDEPENDENT_AMBULATORY_CARE_PROVIDER_SITE_OTHER): Payer: Self-pay | Admitting: Ophthalmology

## 2023-05-25 DIAGNOSIS — I10 Essential (primary) hypertension: Secondary | ICD-10-CM | POA: Diagnosis not present

## 2023-05-25 DIAGNOSIS — H189 Unspecified disorder of cornea: Secondary | ICD-10-CM

## 2023-05-25 DIAGNOSIS — H35033 Hypertensive retinopathy, bilateral: Secondary | ICD-10-CM

## 2023-05-25 DIAGNOSIS — H34811 Central retinal vein occlusion, right eye, with macular edema: Secondary | ICD-10-CM | POA: Diagnosis not present

## 2023-05-25 DIAGNOSIS — Z961 Presence of intraocular lens: Secondary | ICD-10-CM

## 2023-05-25 MED ORDER — AFLIBERCEPT 2MG/0.05ML IZ SOLN FOR KALEIDOSCOPE
2.0000 mg | INTRAVITREAL | Status: AC | PRN
  Administered 2023-05-25: 2 mg via INTRAVITREAL

## 2023-05-25 NOTE — Progress Notes (Signed)
 [facility] - Clinic Note  05/25/2023     CHIEF COMPLAINT Patient presents for Retina Follow Up   HISTORY OF PRESENT ILLNESS: Jordan Grant is a 83 y.o. female who presents to the clinic today for:   HPI     Retina Follow Up   Patient presents with  CRVO/BRVO.  In right eye.  This started 8 weeks ago.  Severity is moderate.  Duration of 8 weeks.  Since onset it is stable.  I, the attending physician,  performed the HPI with the patient and updated documentation appropriately.        Comments   Patient feels the vision is not good. She has a headache today and saw a lot of floaters yesterday. She is using Bromfenac OD BID and she stopped the Pred all on her own - due to calcium forming on the right eye . Next week Dr. Lafayette Dragon is doing surgery on her eye.      Last edited by Rennis Chris, MD on 05/25/2023  1:38 PM.    Pt is having surgery on her cornea next week with Dr. Delaney Meigs, she has stopped PF bc of the calcium forming on her cornea, she is using Xdemvy per Dr. Delaney Meigs  Referring physician: Babs Sciara, MD [address] [place],  [place] [address]  HISTORICAL INFORMATION:   Selected notes from the MEDICAL RECORD NUMBER Referred by Dr. Gerda Diss LEE: Dr. Luciana Axe 9.10.24 - IVA OD #3 Ocular Hx- CRVO w/ CME OD -- s/p retrobulbar kenalog x1, IVA OD x3 PMH-    CURRENT MEDICATIONS: Current Outpatient Medications (Ophthalmic Drugs)  Medication Sig   azelastine (OPTIVAR) 0.05 % ophthalmic solution Place 1 drop into the right eye 2 (two) times daily.   Bromfenac Sodium (PROLENSA) 0.07 % SOLN Apply 1 drop to eye 2 (two) times daily.   Bromfenac Sodium 0.07 % SOLN Apply 1 drop to eye in the morning, at noon, in the evening, and at bedtime.   Polyethyl Glycol-Propyl Glycol (SYSTANE OP) Apply 1 drop to eye daily.    prednisoLONE acetate (PRED FORTE) 1 % ophthalmic suspension Place 1 drop into the right eye 2 (two) times daily.   No  current facility-administered medications for this visit. (Ophthalmic Drugs)   Current Outpatient Medications (Other)  Medication Sig   alendronate (FOSAMAX) 70 MG tablet Take 1 tablet (70 mg total) by mouth every 7 (seven) days. Take with a full glass of water on an empty stomach.   levothyroxine (SYNTHROID) 75 MCG tablet Take 1 tablet (75 mcg total) by mouth daily.   omeprazole (PRILOSEC) 40 MG capsule TAKE 1 CAPSULE BY MOUTH EVERY DAY   OVER THE COUNTER MEDICATION Calcium chews   rosuvastatin (CRESTOR) 10 MG tablet TAKE 1 TABLET BY MOUTH DAILY   traMADol (ULTRAM) 50 MG tablet TAKE 1/2 TABLET BY MOUTH IN THE MORNING AND MID AFTERNOON THEN TAKE 1 TABLET BY MOUTH EVERY EVENING   No current facility-administered medications for this visit. (Other)   REVIEW OF SYSTEMS: ROS   Positive for: Endocrine, Eyes Negative for: Constitutional, Gastrointestinal, Neurological, Skin, Genitourinary, Musculoskeletal, HENT, Cardiovascular, Respiratory, Psychiatric, Allergic/Imm, Heme/Lymph Last edited by Charlette Caffey, COT on 05/25/2023 12:53 PM.     ALLERGIES Allergies  Allergen Reactions   Codeine     Nausea, dizziness   Darvocet [Propoxyphene N-Acetaminophen]     nausea   Pravachol [Pravastatin Sodium]     Joint pain   PAST MEDICAL HISTORY Past Medical History:  Diagnosis Date   Anxiety    Arthritis    hip   Cataract    hx of   Diverticulosis 2007   History of diverticula   Emphysema lung (HCC) 12/24/2019   GERD (gastroesophageal reflux disease)    Hyperlipidemia    Hypothyroidism    Prediabetes    Past Surgical History:  Procedure Laterality Date   APPENDECTOMY     breast lift Bilateral    CATARACT EXTRACTION W/ INTRAOCULAR LENS  IMPLANT, BILATERAL  01/14/2008   Dr Micheal Likens bilateral   COLONOSCOPY     DOPPLER ECHOCARDIOGRAPHY  06/13/2008   Leg   HEMORROIDECTOMY     ML bone density  08/13/2009   TUBAL LIGATION     YAG LASER APPLICATION Right 09/24/2012   Procedure: YAG  LASER APPLICATION;  Surgeon: Loraine Leriche T. Nile Riggs, MD;  Location: [facility];  Service: Ophthalmology;  Laterality: Right;   YAG LASER APPLICATION Left 10/08/2012   Procedure: YAG LASER APPLICATION;  Surgeon: Loraine Leriche T. Nile Riggs, MD;  Location: [facility];  Service: Ophthalmology;  Laterality: Left;   FAMILY HISTORY Family History  Problem Relation Age of Onset   Esophageal cancer Mother 82       died at 9   Stomach cancer Mother 16       removed 1/2 of stomach, distal esophageal ca   Breast cancer Maternal Aunt    Colon cancer Neg Hx    Rectal cancer Neg Hx     SOCIAL HISTORY Social History   Tobacco Use   Smoking status: Former    Current packs/day: 0.00    Types: Cigarettes    Quit date: 04/24/1983    Years since quitting: 40.1   Smokeless tobacco: Never  Substance Use Topics   Alcohol use: Yes    Alcohol/week: 11.0 standard drinks of alcohol    Types: 4 Glasses of wine, 7 Standard drinks or equivalent per week   Drug use: No       OPHTHALMIC EXAM:  Base Eye Exam     Visual Acuity (Snellen - Linear)       Right Left   Dist cc 20/60 +1 20/20   Dist ph cc 20/50     Correction: Glasses         Tonometry (Tonopen, 12:59 PM)       Right Left   Pressure 9 11         Pupils       Dark Light Shape React APD   Right 3 2 Round Brisk None   Left 3 2 Round Brisk None         Visual Fields       Left Right    Full Full         Extraocular Movement       Right Left    Full, Ortho Full, Ortho         Neuro/Psych     Oriented x3: Yes   Mood/Affect: Normal         Dilation     Both eyes: 1.0% Mydriacyl, 2.5% Phenylephrine @ 12:53 PM           Slit Lamp and Fundus Exam     External Exam       Right Left   External Normal Normal         Slit Lamp Exam       Right Left   Lids/Lashes Dermatochalasis - upper lid, Meibomian gland dysfunction Dermatochalasis - upper lid, Meibomian  gland dysfunction   Conjunctiva/Sclera Trace Injection White  and quiet   Cornea Arcus, Well healed cataract wound, fine KP inferiorly, corneal haze and scarring, irregular epi inferiorly Arcus, Well healed cataract wound, 1+ Punctate epithelial erosions   Anterior Chamber Deep and clear Deep and clear   Iris Round and poorly dilated, No NVI Round and dilated   Lens MF PC IOL in good postion with open PC Multi focal PC IOL in good postion with open PC   Anterior Vitreous Vitreous syneresis, mild asteroid hyalosis Vitreous syneresis         Fundus Exam       Right Left   Disc Pink and Sharp, no edema -- disc edema resolved Sharp rim, Trace pallor, mild PPA   C/D Ratio 0.2 0.4   Macula good foveal reflex, central edema -- stably improved, scattered IRH -- improved Flat, good foveal reflex, No heme or edema   Vessels Attenuated, Tortuous, mild copper wiring, AV crossing changes -- CRVO attenuated, Tortuous   Periphery Attached, scattered MA -- improved -- no heme Attached            IMAGING AND PROCEDURES  Imaging and Procedures for 05/25/2023  OCT, Retina - OU - Both Eyes       Right Eye Quality was good. Central Foveal Thickness: 319. Progression has been stable. Findings include normal foveal contour, no IRF, no SRF, intraretinal hyper-reflective material (Stable improvement in central edema, foveal contour, disc edema and vitreous opacities).   Left Eye Quality was good. Central Foveal Thickness: 290. Progression has been stable. Findings include normal foveal contour, no IRF, no SRF, vitreomacular adhesion .   Notes *Images captured and stored on drive  Diagnosis / Impression:  OD: CRVO w/ stable improvement in central edema, foveal contour, disc edema and vitreous opacities OS: NFP; no IRF/SRF   Clinical management:  See below  Abbreviations: NFP - Normal foveal profile. CME - cystoid macular edema. PED - pigment epithelial detachment. IRF - intraretinal fluid. SRF - subretinal fluid. EZ - ellipsoid zone. ERM - epiretinal  membrane. ORA - outer retinal atrophy. ORT - outer retinal tubulation. SRHM - subretinal hyper-reflective material. IRHM - intraretinal hyper-reflective material      Intravitreal Injection, Pharmacologic Agent - OD - Right Eye       Time Out 05/25/2023. 1:19 PM. Confirmed correct patient, procedure, site, and patient consented.   Anesthesia Topical anesthesia was used. Anesthetic medications included Lidocaine 2%, Proparacaine 0.5%.   Procedure Preparation included 5% betadine to ocular surface, eyelid speculum. A (32 g) needle was used.   Injection: 2 mg aflibercept 2 MG/0.05ML   Route: Intravitreal, Site: Right Eye   NDC: L6038910, Lot: [id number], Expiration date: 06/12/2024, Waste: 0 mL   Post-op Post injection exam found visual acuity of at least counting fingers. The patient tolerated the procedure well. There were no complications. The patient received written and verbal post procedure care education.            ASSESSMENT/PLAN:    ICD-10-CM   1. 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 Hypertensive retinopathy of both eyes  H35.033     3. Essential hypertension  I10     4. Pseudophakia of both eyes  Z96.1     5. Keratopathy  H18.9  1. CRVO w/ CME, OD - previously followed with Dr. Luciana Axe - pt reports delayed presentation to Dr. Luciana Axe -- had several week history of decreased vision OD prior to presentation - s/p IVA OD x3 w/ [facility] (last 09.10.24) - s/p retrobulbar kenalog OD x1 - FA (10.07.24) OD: Scattered perivascular leakage and significant leakage peri fovea and disc - s/p IVE OD #1 (10.07.24), #2 (11.04.24), #3 (12.03.24), #4 (12.31.24), #5 (02.14.25) - history of topical steroids - BCVA OD stable at 20/50  - OCT shows OD: stable improvement in central edema, foveal contour, disc  edema and vitreous opacities at 8 wks - recommend IVE OD #6 today, 04.11.25 w/ f/u extended to 10 wks  - RBA of procedure discussed, questions answered - Eylea informed consent obtained and signed 10.07.24 (OD) - see procedure note - [employer] funding unavailable -- pt covering 20% coinsurance on medication - discussed significant inflammation OD - cont PredForte and Bromfenac to BID OD -- pt self d/c PF - f/u 10 weeks -- DFE/OCT, possible injection    2,3.  Hypertensive retinopathy OU - discussed importance of tight BP control - monitor  4. Pseudophakia OU  - s/p CE/IOL with Dr. Nile Riggs (OU) 2009  - IOL in good position, doing well  - s/p YAG Cap  w/ Dr. Nile Riggs (OU) 2014  - monitor  5. Keratopathy OD  - exam shows corneal haze and scarring, early band K and calcification / Salzmann's nodules inferior quadrant  - discussed findings, and likely impact on vision  - ?exacerbation by PredForte and Bromfenac BID OD -- okay to stop  - surgery scheduled with Dr. Delaney Meigs for next week  Ophthalmic Meds Ordered this visit:  Meds ordered this encounter  Medications   aflibercept (EYLEA) SOLN 2 mg     Return in about 10 weeks (around 08/03/2023) for f/u CRVO OD, DFE, OCT, Possible Injxn.  There are no Patient Instructions on file for this visit.   This document serves as a record of services personally performed by Karie Chimera, MD, PhD. It was created on their behalf by Glee Arvin. Manson Passey, OA an ophthalmic technician. The creation of this record is the provider's dictation and/or activities during the visit.    Electronically signed by: Glee Arvin. Manson Passey, OA 05/25/23 1:40 PM  Karie Chimera, M.D., Ph.D. Diseases & Surgery of the Retina and Vitreous [facility] 05/25/2023   I have reviewed the above documentation for accuracy and completeness, and I agree with the above. Karie Chimera, M.D., Ph.D. 05/25/23 1:41 PM   Abbreviations: M myopia  (nearsighted); A astigmatism; H hyperopia (farsighted); P presbyopia; Mrx spectacle prescription;  CTL contact lenses; OD right eye; OS left eye; OU both eyes  XT exotropia; ET esotropia; PEK punctate epithelial keratitis; PEE punctate epithelial erosions; DES dry eye syndrome; MGD meibomian gland dysfunction; ATs artificial tears; PFAT's preservative free artificial tears; NSC nuclear sclerotic cataract; PSC posterior subcapsular cataract; ERM epi-retinal membrane; PVD posterior vitreous detachment; RD retinal detachment; DM diabetes mellitus; DR diabetic retinopathy; NPDR non-proliferative diabetic retinopathy; PDR proliferative diabetic retinopathy; CSME clinically significant macular edema; DME diabetic macular edema; dbh dot blot hemorrhages; CWS cotton wool spot; POAG primary open angle glaucoma; C/D cup-to-disc ratio; HVF humphrey visual field; GVF goldmann visual field; OCT optical coherence tomography; IOP intraocular pressure; BRVO Branch retinal vein occlusion; CRVO central retinal vein occlusion; CRAO central retinal artery occlusion; BRAO branch retinal artery occlusion; RT retinal tear; SB scleral buckle; PPV pars plana  vitrectomy; VH Vitreous hemorrhage; PRP panretinal laser photocoagulation; IVK intravitreal kenalog; VMT vitreomacular traction; MH Macular hole;  NVD neovascularization of the disc; NVE neovascularization elsewhere; AREDS age related eye disease study; ARMD age related macular degeneration; POAG primary open angle glaucoma; EBMD epithelial/anterior basement membrane dystrophy; ACIOL anterior chamber intraocular lens; IOL intraocular lens; PCIOL posterior chamber intraocular lens; Phaco/IOL phacoemulsification with intraocular lens placement; PRK photorefractive keratectomy; LASIK laser assisted in situ keratomileusis; HTN hypertension; DM diabetes mellitus; COPD chronic obstructive pulmonary disease

## 2023-05-31 DIAGNOSIS — H18421 Band keratopathy, right eye: Secondary | ICD-10-CM | POA: Diagnosis not present

## 2023-06-01 DIAGNOSIS — S0501XA Injury of conjunctiva and corneal abrasion without foreign body, right eye, initial encounter: Secondary | ICD-10-CM | POA: Diagnosis not present

## 2023-06-01 DIAGNOSIS — H18421 Band keratopathy, right eye: Secondary | ICD-10-CM | POA: Diagnosis not present

## 2023-06-13 ENCOUNTER — Other Ambulatory Visit: Payer: Self-pay | Admitting: Family Medicine

## 2023-06-13 NOTE — Telephone Encounter (Signed)
 Copied from CRM [id number]. Topic: Clinical - Medication Refill >> Jun 13, 2023  9:11 AM Rosaria Common wrote: Most Recent Primary Care Visit:  Provider: Charlotta Cook A  Department: [facility]  Visit Type: OFFICE VISIT  Date: 03/08/2023  Medication: traMADol  (ULTRAM ) 50 MG tablet  Has the patient contacted their pharmacy? Yes (Agent: If no, request that the patient contact the pharmacy for the refill. If patient does not wish to contact the pharmacy document the reason why and proceed with request.) (Agent: If yes, when and what did the pharmacy advise?)  Is this the correct pharmacy for this prescription? Yes If no, delete pharmacy and type the correct one.  This is the patient's preferred pharmacy:  [facility] [address], [place] - [address] [place] [address] [place] [address] Phone: [phone] Fax: [phone]     Has the prescription been filled recently? Yes  Is the patient out of the medication? No  Has the patient been seen for an appointment in the last year OR does the patient have an upcoming appointment? Yes  Can we respond through MyChart? No  Agent: Please be advised that Rx refills may take up to 3 business days. We ask that you follow-up with your pharmacy.

## 2023-06-15 MED ORDER — TRAMADOL HCL 50 MG PO TABS: ORAL_TABLET | ORAL | 3 refills | Status: DC

## 2023-06-15 NOTE — Telephone Encounter (Signed)
 Pt called to check status of request.

## 2023-07-02 ENCOUNTER — Telehealth: Payer: Self-pay | Admitting: Family Medicine

## 2023-07-02 ENCOUNTER — Other Ambulatory Visit: Payer: Self-pay

## 2023-07-02 MED ORDER — ALENDRONATE SODIUM 70 MG PO TABS: 70.0000 mg | ORAL_TABLET | ORAL | 11 refills | Status: AC

## 2023-07-02 NOTE — Telephone Encounter (Unsigned)
 Copied from CRM [id number]. Topic: Clinical - Medication Refill >> Jul 02, 2023  8:50 AM Jenice Mitts wrote: Medication: alendronate  (FOSAMAX ) 70 MG table  Has the patient contacted their pharmacy? Yes (Agent: If no, request that the patient contact the pharmacy for the refill. If patient does not wish to contact the pharmacy document the reason why and proceed with request.) (Agent: If yes, when and what did the pharmacy advise?)  This is the patient's preferred pharmacy:  [facility] [address] [place] - [address] [place] [address] Phone: [phone] Fax: [phone]  Is this the correct pharmacy for this prescription? Yes If no, delete pharmacy and type the correct one.   Has the prescription been filled recently? No  Is the patient out of the medication? No, week left  Has the patient been seen for an appointment in the last year OR does the patient have an upcoming appointment? Yes  Can we respond through MyChart? Yes  Agent: Please be advised that Rx refills may take up to 3 business days. We ask that you follow-up with your pharmacy.

## 2023-07-25 NOTE — Progress Notes (Signed)
 [facility] - Clinic Note  08/03/2023     CHIEF COMPLAINT Patient presents for Retina Follow Up   HISTORY OF PRESENT ILLNESS: Jordan Grant is a 83 y.o. female who presents to the clinic today for:   HPI     Retina Follow Up   Patient presents with  CRVO/BRVO.  In right eye.  This started 10 weeks ago.  I, the attending physician,  performed the HPI with the patient and updated documentation appropriately.        Comments   Patient here for 10 weeks retina follow up for BRVO OD. Patient states vision not good. Today very blurry. Woke up was like a film over vision. Saw Dr Kirkland Peppers and he did a procedure. Has calcium  scraped off. It is painful. Using steroid drops QHS OD. Uses systane constantly. QID OD.      Last edited by Ronelle Coffee, MD on 08/03/2023  4:24 PM.    Pt states she had a procedure on her right eye cornea with Dr. Kirkland Peppers about 4-5 weeks ago, she feels like her vision is worse than it was last time she was here, she's still on steroid drops   Referring physician: Bennet Brasil, MD [address] [place],  [place] [address]  HISTORICAL INFORMATION:   Selected notes from the MEDICAL RECORD NUMBER Referred by Dr. Fairy Homer LEE: Dr. Seward Dao 9.10.24 - IVA OD #3 Ocular Hx- CRVO w/ CME OD -- s/p retrobulbar kenalog x1, IVA OD x3 PMH-    CURRENT MEDICATIONS: Current Outpatient Medications (Ophthalmic Drugs)  Medication Sig   azelastine  (OPTIVAR ) 0.05 % ophthalmic solution Place 1 drop into the right eye 2 (two) times daily.   Bromfenac  Sodium (PROLENSA ) 0.07 % SOLN Apply 1 drop to eye 2 (two) times daily.   Polyethyl Glycol-Propyl Glycol (SYSTANE OP) Apply 1 drop to eye daily.    prednisoLONE  acetate (PRED FORTE ) 1 % ophthalmic suspension Place 1 drop into the right eye 2 (two) times daily.   Bromfenac  Sodium 0.07 % SOLN Apply 1 drop to eye in the morning, at noon, in the evening, and at bedtime.   No current  facility-administered medications for this visit. (Ophthalmic Drugs)   Current Outpatient Medications (Other)  Medication Sig   alendronate  (FOSAMAX ) 70 MG tablet Take 1 tablet (70 mg total) by mouth every 7 (seven) days. Take with a full glass of water on an empty stomach.   levothyroxine  (SYNTHROID ) 75 MCG tablet Take 1 tablet (75 mcg total) by mouth daily.   omeprazole  (PRILOSEC) 40 MG capsule TAKE 1 CAPSULE BY MOUTH EVERY DAY   OVER THE COUNTER MEDICATION Calcium  chews   rosuvastatin  (CRESTOR ) 10 MG tablet TAKE 1 TABLET BY MOUTH DAILY   traMADol  (ULTRAM ) 50 MG tablet TAKE 1/2 TABLET BY MOUTH IN THE MORNING AND MID AFTERNOON THEN TAKE 1 TABLET BY MOUTH EVERY EVENING   No current facility-administered medications for this visit. (Other)   REVIEW OF SYSTEMS: ROS   Positive for: Endocrine, Eyes Negative for: Constitutional, Gastrointestinal, Neurological, Skin, Genitourinary, Musculoskeletal, HENT, Cardiovascular, Respiratory, Psychiatric, Allergic/Imm, Heme/Lymph Last edited by Sylvan Evener, COA on 08/03/2023 12:51 PM.      ALLERGIES Allergies  Allergen Reactions   Codeine     Nausea, dizziness   Darvocet [Propoxyphene N-Acetaminophen ]     nausea   Pravachol [Pravastatin Sodium]     Joint pain   PAST MEDICAL HISTORY Past Medical History:  Diagnosis Date   Anxiety  Arthritis    hip   Cataract    hx of   Diverticulosis 2007   History of diverticula   Emphysema lung (HCC) 12/24/2019   GERD (gastroesophageal reflux disease)    Hyperlipidemia    Hypothyroidism    Prediabetes    Past Surgical History:  Procedure Laterality Date   APPENDECTOMY     breast lift Bilateral    CATARACT EXTRACTION W/ INTRAOCULAR LENS  IMPLANT, BILATERAL  01/14/2008   Dr Larae Plaster bilateral   COLONOSCOPY     DOPPLER ECHOCARDIOGRAPHY  06/13/2008   Leg   HEMORROIDECTOMY     ML bone density  08/13/2009   TUBAL LIGATION     YAG LASER APPLICATION Right 09/24/2012   Procedure: YAG LASER  APPLICATION;  Surgeon: Lavonia Powers T. Gennie Kicks, MD;  Location: [facility];  Service: Ophthalmology;  Laterality: Right;   YAG LASER APPLICATION Left 10/08/2012   Procedure: YAG LASER APPLICATION;  Surgeon: Lavonia Powers T. Gennie Kicks, MD;  Location: [facility];  Service: Ophthalmology;  Laterality: Left;   FAMILY HISTORY Family History  Problem Relation Age of Onset   Esophageal cancer Mother 89       died at 44   Stomach cancer Mother 52       removed 1/2 of stomach, distal esophageal ca   Breast cancer Maternal Aunt    Colon cancer Neg Hx    Rectal cancer Neg Hx     SOCIAL HISTORY Social History   Tobacco Use   Smoking status: Former    Current packs/day: 0.00    Types: Cigarettes    Quit date: 04/24/1983    Years since quitting: 40.3   Smokeless tobacco: Never  Vaping Use   Vaping status: Never Used  Substance Use Topics   Alcohol use: Yes    Alcohol/week: 11.0 standard drinks of alcohol    Types: 4 Glasses of wine, 7 Standard drinks or equivalent per week   Drug use: No       OPHTHALMIC EXAM:  Base Eye Exam     Visual Acuity (Snellen - Linear)       Right Left   Dist [place] 20/200 -2 20/30 -2   Dist ph [place] 20/80 20/20 -2  Didn't bring glasses.        Tonometry (Tonopen, 12:47 PM)       Right Left   Pressure 12 12         Pupils       Dark Light Shape React APD   Right 3 2 Round Brisk None   Left 3 2 Round Brisk None         Visual Fields (Counting fingers)       Left Right    Full Full         Extraocular Movement       Right Left    Full, Ortho Full, Ortho         Neuro/Psych     Oriented x3: Yes   Mood/Affect: Normal         Dilation     Both eyes: 1.0% Mydriacyl , 2.5% Phenylephrine @ 12:47 PM           Slit Lamp and Fundus Exam     External Exam       Right Left   External Normal Normal         Slit Lamp Exam       Right Left   Lids/Lashes Dermatochalasis - upper lid, Meibomian gland dysfunction Dermatochalasis -  upper lid,  Meibomian gland dysfunction   Conjunctiva/Sclera White and quiet White and quiet   Cornea Arcus, Well healed cataract wound, fine KP inferiorly, corneal haze and scarring, irregular epi inferiorly, 3+ Punctate epithelial erosions Arcus, Well healed cataract wound, 1-2+ Punctate epithelial erosions, tear film debris   Anterior Chamber Deep and clear Deep and clear   Iris Round and poorly dilated, No NVI Round and dilated   Lens MF PC IOL in good postion with open PC Multi focal PC IOL in good postion with open PC   Anterior Vitreous Vitreous syneresis, mild asteroid hyalosis Vitreous syneresis         Fundus Exam       Right Left   Disc Pink and Sharp, mild disc elevation Sharp rim, Trace pallor, mild PPA   C/D Ratio 0.2 0.4   Macula good foveal reflex, central edema -- stably improved, scattered IRH -- improved Flat, good foveal reflex, No heme or edema   Vessels Attenuated, mild tortuosity attenuated, mild tortuosity   Periphery Attached, scattered MA -- improved -- no heme Attached, No heme           Refraction     Wearing Rx       Sphere Cylinder Axis   Right -0.75 +0.75 154   Left -0.50 +1.50 165            IMAGING AND PROCEDURES  Imaging and Procedures for 08/03/2023  OCT, Retina - OU - Both Eyes       Right Eye Quality was borderline. Central Foveal Thickness: 357. Progression has been stable. Findings include normal foveal contour, no IRF, no SRF, intraretinal hyper-reflective material (Stable improvement in central edema, foveal contour, mild interval increase in disc edema, +vitreous opacities).   Left Eye Quality was good. Central Foveal Thickness: 290. Progression has been stable. Findings include normal foveal contour, no IRF, no SRF, vitreomacular adhesion .   Notes *Images captured and stored on drive  Diagnosis / Impression:  OD: CRVO w/ Stable improvement in central edema, foveal contour, mild interval increase in disc edema, +vitreous  opacities OS: NFP; no IRF/SRF   Clinical management:  See below  Abbreviations: NFP - Normal foveal profile. CME - cystoid macular edema. PED - pigment epithelial detachment. IRF - intraretinal fluid. SRF - subretinal fluid. EZ - ellipsoid zone. ERM - epiretinal membrane. ORA - outer retinal atrophy. ORT - outer retinal tubulation. SRHM - subretinal hyper-reflective material. IRHM - intraretinal hyper-reflective material      Intravitreal Injection, Pharmacologic Agent - OD - Right Eye       Time Out 08/03/2023. 1:03 PM. Confirmed correct patient, procedure, site, and patient consented.   Anesthesia Topical anesthesia was used. Anesthetic medications included Lidocaine 2%, Proparacaine 0.5%.   Procedure Preparation included 5% betadine to ocular surface, eyelid speculum. A (32 g) needle was used.   Injection: 2 mg aflibercept  2 MG/0.05ML   Route: Intravitreal, Site: Right Eye   NDC: Q956576, Lot: [id number], Expiration date: 12/12/2024, Waste: 0 mL   Post-op Post injection exam found visual acuity of at least counting fingers. The patient tolerated the procedure well. There were no complications. The patient received written and verbal post procedure care education.             ASSESSMENT/PLAN:    ICD-10-CM   1. 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 Hypertensive retinopathy of both eyes  H35.033     3. Essential hypertension  I10     4. Pseudophakia of both eyes  Z96.1     5. Keratopathy  H18.9       1. CRVO w/ CME, OD - previously followed with Dr. Seward Dao - pt reports delayed presentation to Dr. Seward Dao -- had several week history of decreased vision OD prior to presentation - s/p IVA OD x3 w/ [facility] (last 09.10.24) - s/p retrobulbar kenalog OD x1 - FA (10.07.24) OD: Scattered perivascular  leakage and significant leakage peri fovea and disc - s/p IVE OD #1 (10.07.24), #2 (11.04.24), #3 (12.03.24), #4 (12.31.24), #5 (02.14.25), #6 (04.11.25) - history of topical steroids - BCVA OD 20/80 from 20/50 (mostly corneal changes) - OCT shows OD Stable improvement in central edema, foveal contour, mild interval increase in disc edema, +vitreous opacities at 10 wks - recommend IVE OD #7 today, 06.20.25 w/ f/u back to 7-8 wks  - RBA of procedure discussed, questions answered - Eylea  informed consent obtained and signed 10.07.24 (OD) - see procedure note - [employer] funding unavailable -- pt covering 20% coinsurance on medication - discussed significant inflammation OD - f/u 7-8 weeks -- DFE/OCT, possible injection    2,3.  Hypertensive retinopathy OU - discussed importance of tight BP control - monitor  4. Pseudophakia OU  - s/p CE/IOL with Dr. Gennie Kicks (OU) 2009  - IOL in good position, doing well  - s/p YAG Cap  w/ Dr. Gennie Kicks (OU) 2014  - monitor  5. Keratopathy OD  - exam shows corneal haze and scarring, early band K and calcification / Salzmann's nodules inferior quadrant  - discussed findings, and likely impact on vision  - ?exacerbation by PredForte and Bromfenac  BID OD -- okay to stop  - s/p EDTA chelation OD by Dr. Kirkland Peppers May 2025  Ophthalmic Meds Ordered this visit:  Meds ordered this encounter  Medications   aflibercept  (EYLEA ) SOLN 2 mg     Return for f/u 7-8 wks CRVO OD, DFE, OCT, Possible Injxn.  There are no Patient Instructions on file for this visit.   This document serves as a record of services personally performed by Jeanice Millard, MD, PhD. It was created on their behalf by Angelia Kelp, an ophthalmic technician. The creation of this record is the provider's dictation and/or activities during the visit.    Electronically signed by: Angelia Kelp, OA, 08/03/23  4:25 PM  This document serves as a record of services personally performed by  Jeanice Millard, MD, PhD. It was created on their behalf by Morley Arabia. Bevin Bucks, OA an ophthalmic technician. The creation of this record is the provider's dictation and/or activities during the visit.    Electronically signed by: Morley Arabia. Bevin Bucks, OA 08/03/23 4:25 PM  Jeanice Millard, M.D., Ph.D. Diseases & Surgery of the Retina and Vitreous [facility]  I have reviewed the above documentation for accuracy and completeness, and I agree with the above. Jeanice Millard, M.D., Ph.D. 08/03/23 4:26 PM   Abbreviations: M myopia (nearsighted); A astigmatism; H hyperopia (farsighted); P presbyopia; Mrx spectacle prescription;  CTL contact lenses; OD right eye; OS left eye; OU both eyes  XT exotropia; ET esotropia; PEK punctate epithelial keratitis; PEE punctate epithelial erosions; DES dry eye syndrome; MGD meibomian gland dysfunction; ATs artificial tears; PFAT's preservative free artificial tears; NSC  nuclear sclerotic cataract; PSC posterior subcapsular cataract; ERM epi-retinal membrane; PVD posterior vitreous detachment; RD retinal detachment; DM diabetes mellitus; DR diabetic retinopathy; NPDR non-proliferative diabetic retinopathy; PDR proliferative diabetic retinopathy; CSME clinically significant macular edema; DME diabetic macular edema; dbh dot blot hemorrhages; CWS cotton wool spot; POAG primary open angle glaucoma; C/D cup-to-disc ratio; HVF humphrey visual field; GVF goldmann visual field; OCT optical coherence tomography; IOP intraocular pressure; BRVO Branch retinal vein occlusion; CRVO central retinal vein occlusion; CRAO central retinal artery occlusion; BRAO branch retinal artery occlusion; RT retinal tear; SB scleral buckle; PPV pars plana vitrectomy; VH Vitreous hemorrhage; PRP panretinal laser photocoagulation; IVK intravitreal kenalog; VMT vitreomacular traction; MH Macular hole;  NVD neovascularization of the disc; NVE neovascularization elsewhere; AREDS age related eye  disease study; ARMD age related macular degeneration; POAG primary open angle glaucoma; EBMD epithelial/anterior basement membrane dystrophy; ACIOL anterior chamber intraocular lens; IOL intraocular lens; PCIOL posterior chamber intraocular lens; Phaco/IOL phacoemulsification with intraocular lens placement; PRK photorefractive keratectomy; LASIK laser assisted in situ keratomileusis; HTN hypertension; DM diabetes mellitus; COPD chronic obstructive pulmonary disease

## 2023-08-03 ENCOUNTER — Ambulatory Visit (INDEPENDENT_AMBULATORY_CARE_PROVIDER_SITE_OTHER): Admitting: Ophthalmology

## 2023-08-03 ENCOUNTER — Encounter (INDEPENDENT_AMBULATORY_CARE_PROVIDER_SITE_OTHER): Payer: Self-pay | Admitting: Ophthalmology

## 2023-08-03 DIAGNOSIS — I10 Essential (primary) hypertension: Secondary | ICD-10-CM

## 2023-08-03 DIAGNOSIS — H189 Unspecified disorder of cornea: Secondary | ICD-10-CM | POA: Diagnosis not present

## 2023-08-03 DIAGNOSIS — Z961 Presence of intraocular lens: Secondary | ICD-10-CM | POA: Diagnosis not present

## 2023-08-03 DIAGNOSIS — H34811 Central retinal vein occlusion, right eye, with macular edema: Secondary | ICD-10-CM

## 2023-08-03 DIAGNOSIS — H35033 Hypertensive retinopathy, bilateral: Secondary | ICD-10-CM | POA: Diagnosis not present

## 2023-08-03 MED ORDER — AFLIBERCEPT 2MG/0.05ML IZ SOLN FOR KALEIDOSCOPE
2.0000 mg | INTRAVITREAL | Status: AC | PRN
  Administered 2023-08-03: 2 mg via INTRAVITREAL

## 2023-08-06 DIAGNOSIS — E038 Other specified hypothyroidism: Secondary | ICD-10-CM | POA: Diagnosis not present

## 2023-08-06 DIAGNOSIS — E119 Type 2 diabetes mellitus without complications: Secondary | ICD-10-CM | POA: Diagnosis not present

## 2023-08-06 DIAGNOSIS — E1169 Type 2 diabetes mellitus with other specified complication: Secondary | ICD-10-CM | POA: Diagnosis not present

## 2023-08-06 DIAGNOSIS — G609 Hereditary and idiopathic neuropathy, unspecified: Secondary | ICD-10-CM | POA: Diagnosis not present

## 2023-08-06 DIAGNOSIS — E785 Hyperlipidemia, unspecified: Secondary | ICD-10-CM | POA: Diagnosis not present

## 2023-08-07 ENCOUNTER — Ambulatory Visit: Payer: Self-pay | Admitting: Family Medicine

## 2023-08-07 LAB — LIPID PANEL
Chol/HDL Ratio: 2.6 ratio (ref 0.0–4.4)
Cholesterol, Total: 143 mg/dL (ref 100–199)
HDL: 54 mg/dL (ref >39)
LDL Chol Calc (NIH): 71 mg/dL (ref 0–99)
Triglycerides: 98 mg/dL (ref 0–149)
VLDL Cholesterol Cal: 18 mg/dL (ref 5–40)

## 2023-08-07 LAB — HEMOGLOBIN A1C
Est. average glucose Bld gHb Est-mCnc: 134 mg/dL
Hgb A1c MFr Bld: 6.3 % — ABNORMAL HIGH (ref 4.8–5.6)

## 2023-08-07 LAB — BASIC METABOLIC PANEL WITH GFR
BUN/Creatinine Ratio: 15 (ref 12–28)
BUN: 10 mg/dL (ref 8–27)
CO2: 23 mmol/L (ref 20–29)
Calcium: 9.3 mg/dL (ref 8.7–10.3)
Chloride: 104 mmol/L (ref 96–106)
Creatinine, Ser: 0.67 mg/dL (ref 0.57–1.00)
Glucose: 101 mg/dL — ABNORMAL HIGH (ref 70–99)
Potassium: 4.6 mmol/L (ref 3.5–5.2)
Sodium: 144 mmol/L (ref 134–144)
eGFR: 86 mL/min/{1.73_m2} (ref >59)

## 2023-08-07 LAB — TSH+FREE T4
Free T4: 1.42 ng/dL (ref 0.82–1.77)
TSH: 1.57 u[IU]/mL (ref 0.450–4.500)

## 2023-08-07 LAB — VITAMIN B12: Vitamin B-12: 493 pg/mL (ref 232–1245)

## 2023-08-08 ENCOUNTER — Ambulatory Visit (INDEPENDENT_AMBULATORY_CARE_PROVIDER_SITE_OTHER): Payer: PPO | Admitting: Family Medicine

## 2023-08-08 VITALS — BP 120/70 | HR 80 | Temp 97.9°F | Ht 61.0 in | Wt 121.0 lb

## 2023-08-08 DIAGNOSIS — E119 Type 2 diabetes mellitus without complications: Secondary | ICD-10-CM

## 2023-08-08 DIAGNOSIS — E1169 Type 2 diabetes mellitus with other specified complication: Secondary | ICD-10-CM

## 2023-08-08 DIAGNOSIS — E038 Other specified hypothyroidism: Secondary | ICD-10-CM

## 2023-08-08 DIAGNOSIS — E785 Hyperlipidemia, unspecified: Secondary | ICD-10-CM

## 2023-08-08 NOTE — Progress Notes (Signed)
   Subjective:    Patient ID: Jordan Grant, female    DOB: 27-Jul-1940, 83 y.o.   MRN: [id number]  HPI 5 month follow up  Runny nose 2 to 3 weeks Related to her husband had a upper respiratory illness and she had a head cold coming back from the beach she denies high fever chills or sweats denies wheeze difficulty breathing Results for orders placed or performed in visit on 03/09/23  Vitamin B12   Collection Time: 08/06/23  8:08 AM  Result Value Ref Range   Vitamin B-12 493 232 - 1,245 pg/mL  Hemoglobin A1c   Collection Time: 08/06/23  8:08 AM  Result Value Ref Range   Hgb A1c MFr Bld 6.3 (H) 4.8 - 5.6 %   Est. average glucose Bld gHb Est-mCnc 134 mg/dL  Basic Metabolic Panel   Collection Time: 08/06/23  8:08 AM  Result Value Ref Range   Glucose 101 (H) 70 - 99 mg/dL   BUN 10 8 - 27 mg/dL   Creatinine, Ser 9.32 0.57 - 1.00 mg/dL   eGFR 86 >40 fO/fpw/8.26   BUN/Creatinine Ratio 15 12 - 28   Sodium 144 134 - 144 mmol/L   Potassium 4.6 3.5 - 5.2 mmol/L   Chloride 104 96 - 106 mmol/L   CO2 23 20 - 29 mmol/L   Calcium  9.3 8.7 - 10.3 mg/dL  Lipid Panel   Collection Time: 08/06/23  8:08 AM  Result Value Ref Range   Cholesterol, Total 143 100 - 199 mg/dL   Triglycerides 98 0 - 149 mg/dL   HDL 54 >60 mg/dL   VLDL Cholesterol Cal 18 5 - 40 mg/dL   LDL Chol Calc (NIH) 71 0 - 99 mg/dL   Chol/HDL Ratio 2.6 0.0 - 4.4 ratio  TSH + free T4   Collection Time: 08/06/23  8:08 AM  Result Value Ref Range   TSH 1.570 0.450 - 4.500 uIU/mL   Free T4 1.42 0.82 - 1.77 ng/dL   Her labs were reviewed in detail Medication list reviewed Patient denies any chest tightness pressure pain shortness of breath recently   Review of Systems     Objective:   Physical Exam General-in no acute distress Eyes-no discharge Lungs-respiratory rate normal, CTA CV-no murmurs,RRR Extremities skin warm dry no edema Neuro grossly normal Behavior normal, alert        Assessment & Plan:  1.  Diabetes mellitus without complication (HCC) (Primary) A1c under good control continue current measures  2. Hyperlipidemia associated with type 2 diabetes mellitus (HCC) Cholesterol profile overall looks good continue current measures  3. Other specified hypothyroidism Thyroid  profile is where it needs to be-continue medication  Patient overall doing well she is holding her weight she is able to do some walking on a regular basis taking all of her medicines her lab work looks good continue current measures follow-up in 6 months

## 2023-09-03 ENCOUNTER — Other Ambulatory Visit: Payer: Self-pay | Admitting: Family Medicine

## 2023-09-04 ENCOUNTER — Telehealth: Payer: Self-pay

## 2023-09-04 ENCOUNTER — Other Ambulatory Visit: Payer: Self-pay

## 2023-09-04 DIAGNOSIS — M25519 Pain in unspecified shoulder: Secondary | ICD-10-CM

## 2023-09-04 NOTE — Telephone Encounter (Signed)
 Spoke with patient in regards to her shoulder pain. Patient states she normally get cortisone shots for this on going issue but the last one has not helped at all. Patient is possibly wanting a referral to orthopedic surgeon.

## 2023-09-04 NOTE — Telephone Encounter (Signed)
  Communication  Reason for CRM: Patient requesting to speak to Dr. Steven nurse. Patient states she has some questions to ask and declined to give further information.        Best call back number, [phone]                Attempted to call patient in regards, no answer

## 2023-09-14 NOTE — Progress Notes (Signed)
 [facility] - Clinic Note  09/28/2023     CHIEF COMPLAINT Patient presents for Retina Follow Up   HISTORY OF PRESENT ILLNESS: Jordan Grant is a 83 y.o. female who presents to the clinic today for:   HPI     Retina Follow Up   Patient presents with  CRVO/BRVO.  In right eye.  This started 10 months ago.  Severity is moderate.  Duration of 8 weeks.  I, the attending physician,  performed the HPI with the patient and updated documentation appropriately.        Comments   Pt states vision seems to fluctuate, she will have clear days and film days. Pt is using Systane 2-3 times per day OU. Pt denies FOL/floaters/pain.      Last edited by Valdemar Rogue, MD on 09/29/2023 12:53 AM.    Pt states she still sees film over OD.   Referring physician: Alphonsa Glendia LABOR, MD [address] [place],  [place] [address]  HISTORICAL INFORMATION:   Selected notes from the MEDICAL RECORD NUMBER Referred by Dr. Alphonsa LEE: Dr. Elner 9.10.24 - IVA OD #3 Ocular Hx- CRVO w/ CME OD -- s/p retrobulbar kenalog x1, IVA OD x3 PMH-    CURRENT MEDICATIONS: Current Outpatient Medications (Ophthalmic Drugs)  Medication Sig   Polyethyl Glycol-Propyl Glycol (SYSTANE OP) Apply 1 drop to eye daily.    prednisoLONE  acetate (PRED FORTE ) 1 % ophthalmic suspension Place 1 drop into the right eye 2 (two) times daily.   No current facility-administered medications for this visit. (Ophthalmic Drugs)   Current Outpatient Medications (Other)  Medication Sig   alendronate  (FOSAMAX ) 70 MG tablet Take 1 tablet (70 mg total) by mouth every 7 (seven) days. Take with a full glass of water on an empty stomach.   levothyroxine  (SYNTHROID ) 75 MCG tablet Take 1 tablet (75 mcg total) by mouth daily.   omeprazole  (PRILOSEC) 40 MG capsule TAKE 1 CAPSULE BY MOUTH EVERY DAY   OVER THE COUNTER MEDICATION Calcium  chews   rosuvastatin  (CRESTOR ) 10 MG tablet Take 1 tablet by mouth once daily    traMADol  (ULTRAM ) 50 MG tablet TAKE 1/2 TABLET BY MOUTH IN THE MORNING AND MID AFTERNOON THEN TAKE 1 TABLET BY MOUTH EVERY EVENING   No current facility-administered medications for this visit. (Other)   REVIEW OF SYSTEMS: ROS   Positive for: Endocrine, Eyes Negative for: Constitutional, Gastrointestinal, Neurological, Skin, Genitourinary, Musculoskeletal, HENT, Cardiovascular, Respiratory, Psychiatric, Allergic/Imm, Heme/Lymph Last edited by Elnor Avelina RAMAN, COT on 09/28/2023  1:23 PM.       ALLERGIES Allergies  Allergen Reactions   Codeine     Nausea, dizziness   Darvocet [Propoxyphene N-Acetaminophen ]     nausea   Pravachol [Pravastatin Sodium]     Joint pain   PAST MEDICAL HISTORY Past Medical History:  Diagnosis Date   Anxiety    Arthritis    hip   Cataract    hx of   Diverticulosis 2007   History of diverticula   Emphysema lung (HCC) 12/24/2019   GERD (gastroesophageal reflux disease)    Hyperlipidemia    Hypothyroidism    Prediabetes    Past Surgical History:  Procedure Laterality Date   APPENDECTOMY     breast lift Bilateral    CATARACT EXTRACTION W/ INTRAOCULAR LENS  IMPLANT, BILATERAL  01/14/2008   Dr Phares bilateral   COLONOSCOPY     DOPPLER ECHOCARDIOGRAPHY  06/13/2008   Leg   HEMORROIDECTOMY  ML bone density  08/13/2009   TUBAL LIGATION     YAG LASER APPLICATION Right 09/24/2012   Procedure: YAG LASER APPLICATION;  Surgeon: Oneil T. Roz, MD;  Location: [facility];  Service: Ophthalmology;  Laterality: Right;   YAG LASER APPLICATION Left 10/08/2012   Procedure: YAG LASER APPLICATION;  Surgeon: Oneil T. Roz, MD;  Location: [facility];  Service: Ophthalmology;  Laterality: Left;   FAMILY HISTORY Family History  Problem Relation Age of Onset   Esophageal cancer Mother 71       died at 48   Stomach cancer Mother 53       removed 1/2 of stomach, distal esophageal ca   Breast cancer Maternal Aunt    Colon cancer Neg Hx    Rectal cancer Neg Hx      SOCIAL HISTORY Social History   Tobacco Use   Smoking status: Former    Current packs/day: 0.00    Types: Cigarettes    Quit date: 04/24/1983    Years since quitting: 40.4   Smokeless tobacco: Never  Vaping Use   Vaping status: Never Used  Substance Use Topics   Alcohol use: Yes    Alcohol/week: 11.0 standard drinks of alcohol    Types: 4 Glasses of wine, 7 Standard drinks or equivalent per week   Drug use: No       OPHTHALMIC EXAM:  Base Eye Exam     Visual Acuity (Snellen - Linear)       Right Left   Dist cc 20/100 -2 20/20 -1   Dist ph cc 20/80 -2     Correction: Glasses         Tonometry (Tonopen, 1:19 PM)       Right Left   Pressure 13 12         Pupils       Pupils Dark Light Shape React APD   Right PERRL 3 2 Round Brisk None   Left PERRL 3 2 Round Brisk None         Visual Fields       Left Right    Full Full         Extraocular Movement       Right Left    Full, Ortho Full, Ortho         Neuro/Psych     Oriented x3: Yes   Mood/Affect: Normal         Dilation     Both eyes: 1.0% Mydriacyl , 2.5% Phenylephrine @ 1:19 PM           Slit Lamp and Fundus Exam     External Exam       Right Left   External Normal Normal         Slit Lamp Exam       Right Left   Lids/Lashes Dermatochalasis, Meibomian gland dysfunction Dermatochalasis, Meibomian gland dysfunction   Conjunctiva/Sclera White and quiet White and quiet   Cornea Arcus, Well healed cataract wound, fine KP inferiorly, corneal haze and scarring, irregular epi inferiorly, 2-3+ Punctate epithelial erosions Arcus, Well healed cataract wound, 2+ Punctate epithelial erosions, tear film debris   Anterior Chamber Deep and clear Deep and clear   Iris Round and moderately dilated, No NVI Round and dilated   Lens MF PC IOL in good postion with open PC Multi focal PC IOL in good postion with open PC   Anterior Vitreous mild asteroid hyalosis, mild syneresis  Vitreous syneresis  Fundus Exam       Right Left   Disc Pink and Sharp, mild disc elevation--improved Sharp rim, Trace pallor, mild PPA   C/D Ratio 0.2 0.4   Macula good foveal reflex, central edema -- stably improved, scattered IRH -- improved Flat, good foveal reflex, No heme or edema   Vessels Attenuated, mild tortuosity attenuated, mild tortuosity   Periphery Attached, scattered MA -- improved -- no heme Attached, No heme           Refraction     Wearing Rx       Sphere Cylinder Axis   Right -0.75 +0.75 154   Left -0.50 +1.50 165            IMAGING AND PROCEDURES  Imaging and Procedures for 09/28/2023  OCT, Retina - OU - Both Eyes       Right Eye Quality was borderline. Central Foveal Thickness: 364. Progression has improved. Findings include normal foveal contour, no IRF, no SRF, intraretinal hyper-reflective material (Stable improvement in central edema, foveal contour, mild interval improvement in disc edema, +vitreous opacities).   Left Eye Quality was good. Central Foveal Thickness: 288. Progression has been stable. Findings include normal foveal contour, no IRF, no SRF, vitreomacular adhesion .   Notes *Images captured and stored on drive  Diagnosis / Impression:  OD: CRVO w/ Stable improvement in central edema, foveal contour, mild interval improvement in disc edema, +vitreous opacities OS: NFP; no IRF/SRF   Clinical management:  See below  Abbreviations: NFP - Normal foveal profile. CME - cystoid macular edema. PED - pigment epithelial detachment. IRF - intraretinal fluid. SRF - subretinal fluid. EZ - ellipsoid zone. ERM - epiretinal membrane. ORA - outer retinal atrophy. ORT - outer retinal tubulation. SRHM - subretinal hyper-reflective material. IRHM - intraretinal hyper-reflective material      Intravitreal Injection, Pharmacologic Agent - OD - Right Eye       Time Out 09/28/2023. 2:15 PM. Confirmed correct patient, procedure, site,  and patient consented.   Anesthesia Topical anesthesia was used. Anesthetic medications included Lidocaine 2%, Proparacaine 0.5%.   Procedure Preparation included 5% betadine to ocular surface, eyelid speculum. A (32 g) needle was used.   Injection: 2 mg aflibercept  2 MG/0.05ML   Route: Intravitreal, Site: Right Eye   NDC: Q956576, Lot: [id number], Expiration date: 12/13/2024, Waste: 0 mL   Post-op Post injection exam found visual acuity of at least counting fingers. The patient tolerated the procedure well. There were no complications. The patient received written and verbal post procedure care education.            ASSESSMENT/PLAN:    ICD-10-CM   1. 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 Hypertensive retinopathy of both eyes  H35.033     3. Essential hypertension  I10     4. Pseudophakia of both eyes  Z96.1     5. Keratopathy  H18.9      1. CRVO w/ CME, OD - previously followed with Dr. Elner - pt reports delayed presentation to Dr. Elner -- had several week history of decreased vision OD prior to presentation - s/p IVA OD x3 w/ [facility] (last 09.10.24) - s/p retrobulbar kenalog OD x1 - FA (10.07.24) OD: Scattered perivascular leakage and significant leakage peri fovea and disc -  s/p IVE OD #1 (10.07.24), #2 (11.04.24), #3 (12.03.24), #4 (12.31.24), #5 (02.14.25), #6 (04.11.25), #7 (06.20.25) - history of topical steroids - BCVA OD 20/80 from 20/50 (mostly corneal changes) - OCT shows OD Stable improvement in central edema, foveal contour, mild interval increase in disc edema, +vitreous opacities at 10 wks - recommend IVE OD #8 (08.15.25) today w/ f/u back to 8-9 wks  - RBA of procedure discussed, questions answered - Eylea  informed consent obtained and signed 10.07.24 (OD) - see procedure note -  [employer] funding unavailable -- pt covering 20% coinsurance on medication - discussed significant inflammation OD - f/u 8-9 weeks -- DFE/OCT, possible injection    2,3.  Hypertensive retinopathy OU - discussed importance of tight BP control - monitor  4. Pseudophakia OU  - s/p CE/IOL with Dr. Roz (OU) 2009  - IOL in good position, doing well  - s/p YAG Cap  w/ Dr. Roz (OU) 2014  - monitor  5. Keratopathy OD  - exam shows corneal haze and scarring, early band K and calcification / Salzmann's nodules inferior quadrant  - discussed findings, and likely impact on vision  - ?exacerbation by PredForte and Bromfenac  BID OD -- okay to stop  - s/p EDTA chelation OD by Dr. Meridee May 2025  Ophthalmic Meds Ordered this visit:  Meds ordered this encounter  Medications   aflibercept  (EYLEA ) SOLN 2 mg     Return for 8-9 weeks CRVO OD, DFE, OCT, Possible Injxn.  There are no Patient Instructions on file for this visit.   This document serves as a record of services personally performed by Redell JUDITHANN Hans, MD, PhD. It was created on their behalf by Avelina Pereyra, COA an ophthalmic technician. The creation of this record is the provider's dictation and/or activities during the visit.   Electronically signed by: Avelina GORMAN Pereyra, COT  09/30/23  10:16 PM    Redell JUDITHANN Hans, M.D., Ph.D. Diseases & Surgery of the Retina and Vitreous [facility]  I have reviewed the above documentation for accuracy and completeness, and I agree with the above. Redell JUDITHANN Hans, M.D., Ph.D. 09/30/23 10:17 PM   Abbreviations: M myopia (nearsighted); A astigmatism; H hyperopia (farsighted); P presbyopia; Mrx spectacle prescription;  CTL contact lenses; OD right eye; OS left eye; OU both eyes  XT exotropia; ET esotropia; PEK punctate epithelial keratitis; PEE punctate epithelial erosions; DES dry eye syndrome; MGD meibomian gland dysfunction; ATs artificial tears; PFAT's  preservative free artificial tears; NSC nuclear sclerotic cataract; PSC posterior subcapsular cataract; ERM epi-retinal membrane; PVD posterior vitreous detachment; RD retinal detachment; DM diabetes mellitus; DR diabetic retinopathy; NPDR non-proliferative diabetic retinopathy; PDR proliferative diabetic retinopathy; CSME clinically significant macular edema; DME diabetic macular edema; dbh dot blot hemorrhages; CWS cotton wool spot; POAG primary open angle glaucoma; C/D cup-to-disc ratio; HVF humphrey visual field; GVF goldmann visual field; OCT optical coherence tomography; IOP intraocular pressure; BRVO Branch retinal vein occlusion; CRVO central retinal vein occlusion; CRAO central retinal artery occlusion; BRAO branch retinal artery occlusion; RT retinal tear; SB scleral buckle; PPV pars plana vitrectomy; VH Vitreous hemorrhage; PRP panretinal laser photocoagulation; IVK intravitreal kenalog; VMT vitreomacular traction; MH Macular hole;  NVD neovascularization of the disc; NVE neovascularization elsewhere; AREDS age related eye disease study; ARMD age related macular degeneration; POAG primary open angle glaucoma; EBMD epithelial/anterior basement membrane dystrophy; ACIOL anterior chamber intraocular lens; IOL intraocular lens; PCIOL posterior chamber intraocular lens; Phaco/IOL phacoemulsification with intraocular lens placement; PRK photorefractive keratectomy;  LASIK laser assisted in situ keratomileusis; HTN hypertension; DM diabetes mellitus; COPD chronic obstructive pulmonary disease

## 2023-09-26 ENCOUNTER — Ambulatory Visit: Admitting: Orthopedic Surgery

## 2023-09-28 ENCOUNTER — Encounter (INDEPENDENT_AMBULATORY_CARE_PROVIDER_SITE_OTHER): Payer: Self-pay | Admitting: Ophthalmology

## 2023-09-28 ENCOUNTER — Ambulatory Visit (INDEPENDENT_AMBULATORY_CARE_PROVIDER_SITE_OTHER): Admitting: Ophthalmology

## 2023-09-28 DIAGNOSIS — H35033 Hypertensive retinopathy, bilateral: Secondary | ICD-10-CM

## 2023-09-28 DIAGNOSIS — I10 Essential (primary) hypertension: Secondary | ICD-10-CM

## 2023-09-28 DIAGNOSIS — H189 Unspecified disorder of cornea: Secondary | ICD-10-CM

## 2023-09-28 DIAGNOSIS — H34811 Central retinal vein occlusion, right eye, with macular edema: Secondary | ICD-10-CM

## 2023-09-28 DIAGNOSIS — Z961 Presence of intraocular lens: Secondary | ICD-10-CM | POA: Diagnosis not present

## 2023-09-29 ENCOUNTER — Encounter (INDEPENDENT_AMBULATORY_CARE_PROVIDER_SITE_OTHER): Payer: Self-pay | Admitting: Ophthalmology

## 2023-09-29 MED ORDER — AFLIBERCEPT 2MG/0.05ML IZ SOLN FOR KALEIDOSCOPE
2.0000 mg | INTRAVITREAL | Status: AC | PRN
  Administered 2023-09-29: 2 mg via INTRAVITREAL

## 2023-11-15 DIAGNOSIS — H52203 Unspecified astigmatism, bilateral: Secondary | ICD-10-CM | POA: Diagnosis not present

## 2023-11-15 DIAGNOSIS — Z961 Presence of intraocular lens: Secondary | ICD-10-CM | POA: Diagnosis not present

## 2023-11-15 DIAGNOSIS — E119 Type 2 diabetes mellitus without complications: Secondary | ICD-10-CM | POA: Diagnosis not present

## 2023-11-15 DIAGNOSIS — H04123 Dry eye syndrome of bilateral lacrimal glands: Secondary | ICD-10-CM | POA: Diagnosis not present

## 2023-11-15 LAB — OPHTHALMOLOGY REPORT-SCANNED

## 2023-11-23 ENCOUNTER — Other Ambulatory Visit: Payer: Self-pay | Admitting: Family Medicine

## 2023-11-28 NOTE — Progress Notes (Signed)
 [facility] - Clinic Note  11/30/2023     CHIEF COMPLAINT Patient presents for Retina Follow Up   HISTORY OF PRESENT ILLNESS: Jordan Grant is a 83 y.o. female who presents to the clinic today for:   HPI     Retina Follow Up   Patient presents with  CRVO/BRVO.  In right eye.  Severity is moderate.  Duration of 9 weeks.  Since onset it is stable.  I, the attending physician,  performed the HPI with the patient and updated documentation appropriately.        Comments   9 week Retina eval. Patient states vision seems the same. Patient has new glasses      Last edited by Valdemar Rogue, MD on 11/30/2023  4:43 PM.     Pt states she still sees film over OD.   Referring physician: Alphonsa Glendia LABOR, MD [address] [place],  [place] [address]  HISTORICAL INFORMATION:   Selected notes from the MEDICAL RECORD NUMBER Referred by Dr. Alphonsa LEE: Dr. Elner 9.10.24 - IVA OD #3 Ocular Hx- CRVO w/ CME OD -- s/p retrobulbar kenalog x1, IVA OD x3 PMH-    CURRENT MEDICATIONS: Current Outpatient Medications (Ophthalmic Drugs)  Medication Sig   Polyethyl Glycol-Propyl Glycol (SYSTANE OP) Apply 1 drop to eye daily.    prednisoLONE  acetate (PRED FORTE ) 1 % ophthalmic suspension Place 1 drop into the right eye 2 (two) times daily.   No current facility-administered medications for this visit. (Ophthalmic Drugs)   Current Outpatient Medications (Other)  Medication Sig   alendronate  (FOSAMAX ) 70 MG tablet Take 1 tablet (70 mg total) by mouth every 7 (seven) days. Take with a full glass of water on an empty stomach.   omeprazole  (PRILOSEC) 40 MG capsule TAKE 1 CAPSULE BY MOUTH EVERY DAY   OVER THE COUNTER MEDICATION Calcium  chews   rosuvastatin  (CRESTOR ) 10 MG tablet Take 1 tablet by mouth once daily   traMADol  (ULTRAM ) 50 MG tablet TAKE 1/2 (ONE-HALF) TABLET BY MOUTH IN THE MORNING AND AT MID-AFTERNOON AND 1 IN THE EVENING   levothyroxine  (SYNTHROID ) 75  MCG tablet Take 1 tablet by mouth once daily   No current facility-administered medications for this visit. (Other)   REVIEW OF SYSTEMS: ROS   Positive for: Endocrine, Eyes Negative for: Constitutional, Gastrointestinal, Neurological, Skin, Genitourinary, Musculoskeletal, HENT, Cardiovascular, Respiratory, Psychiatric, Allergic/Imm, Heme/Lymph Last edited by German Olam BRAVO, COT on 11/30/2023  1:03 PM.     ALLERGIES Allergies  Allergen Reactions   Codeine     Nausea, dizziness   Darvocet [Propoxyphene N-Acetaminophen ]     nausea   Pravachol [Pravastatin Sodium]     Joint pain   PAST MEDICAL HISTORY Past Medical History:  Diagnosis Date   Anxiety    Arthritis    hip   Cataract    hx of   Diverticulosis 2007   History of diverticula   Emphysema lung (HCC) 12/24/2019   GERD (gastroesophageal reflux disease)    Hyperlipidemia    Hypothyroidism    Prediabetes    Past Surgical History:  Procedure Laterality Date   APPENDECTOMY     breast lift Bilateral    CATARACT EXTRACTION W/ INTRAOCULAR LENS  IMPLANT, BILATERAL  01/14/2008   Dr Phares bilateral   COLONOSCOPY     DOPPLER ECHOCARDIOGRAPHY  06/13/2008   Leg   HEMORROIDECTOMY     ML bone density  08/13/2009   TUBAL LIGATION     YAG  LASER APPLICATION Right 09/24/2012   Procedure: YAG LASER APPLICATION;  Surgeon: Oneil T. Roz, MD;  Location: [facility];  Service: Ophthalmology;  Laterality: Right;   YAG LASER APPLICATION Left 10/08/2012   Procedure: YAG LASER APPLICATION;  Surgeon: Oneil T. Roz, MD;  Location: [facility];  Service: Ophthalmology;  Laterality: Left;   FAMILY HISTORY Family History  Problem Relation Age of Onset   Esophageal cancer Mother 104       died at 44   Stomach cancer Mother 73       removed 1/2 of stomach, distal esophageal ca   Breast cancer Maternal Aunt    Colon cancer Neg Hx    Rectal cancer Neg Hx     SOCIAL HISTORY Social History   Tobacco Use   Smoking status: Former    Current  packs/day: 0.00    Types: Cigarettes    Quit date: 04/24/1983    Years since quitting: 40.6   Smokeless tobacco: Never  Vaping Use   Vaping status: Never Used  Substance Use Topics   Alcohol use: Yes    Alcohol/week: 11.0 standard drinks of alcohol    Types: 4 Glasses of wine, 7 Standard drinks or equivalent per week   Drug use: No       OPHTHALMIC EXAM:  Base Eye Exam     Visual Acuity (Snellen - Linear)       Right Left   Dist cc 20/60 20/25 -2   Dist ph cc 20/50 -3 20/NI    Correction: Glasses         Tonometry (Tonopen, 1:14 PM)       Right Left   Pressure 10 11         Pupils       Dark Light Shape React APD   Right 3 2 Round Brisk None   Left 3 2 Round Brisk None         Visual Fields (Counting fingers)       Left Right    Full Full         Extraocular Movement       Right Left    Full, Ortho Full, Ortho         Neuro/Psych     Oriented x3: Yes   Mood/Affect: Normal         Dilation     Both eyes: 1.0% Mydriacyl , 2.5% Phenylephrine @ 1:14 PM           Slit Lamp and Fundus Exam     External Exam       Right Left   External Normal Normal         Slit Lamp Exam       Right Left   Lids/Lashes Dermatochalasis, Meibomian gland dysfunction Dermatochalasis, Meibomian gland dysfunction   Conjunctiva/Sclera White and quiet White and quiet   Cornea Arcus, Well healed cataract wound, fine KP inferiorly, corneal haze and scarring, irregular epi inferiorly, 2-3+ Punctate epithelial erosions Arcus, Well healed cataract wound, 2+ Punctate epithelial erosions, tear film debris   Anterior Chamber Deep and clear Deep and clear   Iris Round and moderately dilated, No NVI Round and dilated   Lens MF PC IOL in good postion with open PC Multi focal PC IOL in good postion with open PC   Anterior Vitreous mild asteroid hyalosis, mild syneresis Vitreous syneresis         Fundus Exam       Right Left   Disc  Pink and Sharp, mild disc  elevation--improved Sharp rim, Trace pallor, mild PPA   C/D Ratio 0.2 0.4   Macula good foveal reflex, central edema -- stably improved, scattered IRH -- improved Flat, good foveal reflex, No heme or edema   Vessels Attenuated, mild tortuosity attenuated, mild tortuosity   Periphery Attached, scattered MA -- improved -- no heme Attached, No heme           Refraction     Wearing Rx       Sphere Cylinder Axis Add   Right -1.00 +2.75 162 +2.75   Left -0.50 +2.00 177 +2.75    Type: Bifocal            IMAGING AND PROCEDURES  Imaging and Procedures for 11/30/2023  OCT, Retina - OU - Both Eyes       Right Eye Quality was borderline. Central Foveal Thickness: 347. Progression has improved. Findings include normal foveal contour, no IRF, no SRF, intraretinal hyper-reflective material (Stable improvement in central edema, foveal contour, mild interval improvement in disc edema, +vitreous opacities).   Left Eye Quality was good. Central Foveal Thickness: 287. Progression has been stable. Findings include normal foveal contour, no IRF, no SRF, vitreomacular adhesion .   Notes *Images captured and stored on drive  Diagnosis / Impression:  OD: CRVO w/ Stable improvement in central edema, foveal contour, mild interval improvement in disc edema, +vitreous opacities OS: NFP; no IRF/SRF   Clinical management:  See below  Abbreviations: NFP - Normal foveal profile. CME - cystoid macular edema. PED - pigment epithelial detachment. IRF - intraretinal fluid. SRF - subretinal fluid. EZ - ellipsoid zone. ERM - epiretinal membrane. ORA - outer retinal atrophy. ORT - outer retinal tubulation. SRHM - subretinal hyper-reflective material. IRHM - intraretinal hyper-reflective material      Intravitreal Injection, Pharmacologic Agent - OD - Right Eye       Time Out 11/30/2023. 1:49 PM. Confirmed correct patient, procedure, site, and patient consented.   Anesthesia Topical anesthesia was  used. Anesthetic medications included Lidocaine 2%, Proparacaine 0.5%.   Procedure Preparation included 5% betadine to ocular surface, eyelid speculum. A (32 g) needle was used.   Injection: 2 mg aflibercept  2 MG/0.05ML   Route: Intravitreal, Site: Right Eye   NDC: [id number], Lot: [id number], Expiration date: 01/12/2025, Waste: 0 mL   Post-op Post injection exam found visual acuity of at least counting fingers. The patient tolerated the procedure well. There were no complications. The patient received written and verbal post procedure care education.             ASSESSMENT/PLAN:    ICD-10-CM   1. 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 Hypertensive retinopathy of both eyes  H35.033     3. Essential hypertension  I10     4. Pseudophakia of both eyes  Z96.1     5. Keratopathy  H18.9      1. CRVO w/ CME, OD - previously followed with Dr. Elner - pt reports delayed presentation to Dr. Elner -- had several week history of decreased vision OD prior to presentation - s/p IVA OD x3 w/ [facility] (last 09.10.24) - s/p retrobulbar kenalog OD x1 - FA (10.07.24) OD: Scattered perivascular leakage and significant leakage peri fovea and disc - s/p  IVE OD #1 (10.07.24), #2 (11.04.24), #3 (12.03.24), #4 (12.31.24), #5 (02.14.25), #6 (04.11.25), #7 (06.20.25) #8 (08.15.25) - history of topical steroids - BCVA OD 20/50 from 20/80 - OCT shows OD Stable improvement in central edema, foveal contour, mild interval improvement in disc edema, +vitreous opacities at 9 wks - recommend IVE OD #9 (10.17.25) today w/ f/u in 8-9 wks  - RBA of procedure discussed, questions answered - Eylea  informed consent obtained and signed 10.07.24 (OD) - see procedure note - [employer] funding unavailable -- pt covering 20% coinsurance on  medication - discussed significant inflammation OD - f/u 8-9 weeks -- DFE/OCT, possible injection    2,3.  Hypertensive retinopathy OU - discussed importance of tight BP control - monitor  4. Pseudophakia OU  - s/p CE/IOL with Dr. Roz (OU) 2009  - IOL in good position, doing well  - s/p YAG Cap  w/ Dr. Roz (OU) 2014  - monitor  5. Keratopathy OD - exam shows corneal haze and scarring, early band K and calcification / Salzmann's nodules inferior quadrant  - discussed findings, and likely impact on vision  - ?exacerbation by PredForte and Bromfenac  BID OD -- okay to stop  - s/p EDTA chelation OD by Dr. Meridee May 2025  Ophthalmic Meds Ordered this visit:  Meds ordered this encounter  Medications   aflibercept  (EYLEA ) SOLN 2 mg     Return in about 8 weeks (around 01/25/2024) for f/u, CRVO, DFE, OCT, Possible, IVE, OD.  There are no Patient Instructions on file for this visit.   This document serves as a record of services personally performed by Redell JUDITHANN Hans, MD, PhD. It was created on their behalf by Delon Newness COT, an ophthalmic technician. The creation of this record is the provider's dictation and/or activities during the visit.    Electronically signed by: Delon Newness COT 10.15.25  12:05 PM  This document serves as a record of services personally performed by Redell JUDITHANN Hans, MD, PhD. It was created on their behalf by Wanda GEANNIE Keens, COT an ophthalmic technician. The creation of this record is the provider's dictation and/or activities during the visit.    Electronically signed by:  Wanda GEANNIE Keens, COT  12/09/23 12:05 PM  Redell JUDITHANN Hans, M.D., Ph.D. Diseases & Surgery of the Retina and Vitreous [facility] 11/30/2023    I have reviewed the above documentation for accuracy and completeness, and I agree with the above. Redell JUDITHANN Hans, M.D., Ph.D. 12/09/23 12:05 PM   Abbreviations: M myopia  (nearsighted); A astigmatism; H hyperopia (farsighted); P presbyopia; Mrx spectacle prescription;  CTL contact lenses; OD right eye; OS left eye; OU both eyes  XT exotropia; ET esotropia; PEK punctate epithelial keratitis; PEE punctate epithelial erosions; DES dry eye syndrome; MGD meibomian gland dysfunction; ATs artificial tears; PFAT's preservative free artificial tears; NSC nuclear sclerotic cataract; PSC posterior subcapsular cataract; ERM epi-retinal membrane; PVD posterior vitreous detachment; RD retinal detachment; DM diabetes mellitus; DR diabetic retinopathy; NPDR non-proliferative diabetic retinopathy; PDR proliferative diabetic retinopathy; CSME clinically significant macular edema; DME diabetic macular edema; dbh dot blot hemorrhages; CWS cotton wool spot; POAG primary open angle glaucoma; C/D cup-to-disc ratio; HVF humphrey visual field; GVF goldmann visual field; OCT optical coherence tomography; IOP intraocular pressure; BRVO Branch retinal vein occlusion; CRVO central retinal vein occlusion; CRAO central retinal artery occlusion; BRAO branch retinal artery occlusion; RT retinal tear; SB scleral buckle; PPV pars plana vitrectomy; VH Vitreous hemorrhage; PRP panretinal laser photocoagulation;  IVK intravitreal kenalog; VMT vitreomacular traction; MH Macular hole;  NVD neovascularization of the disc; NVE neovascularization elsewhere; AREDS age related eye disease study; ARMD age related macular degeneration; POAG primary open angle glaucoma; EBMD epithelial/anterior basement membrane dystrophy; ACIOL anterior chamber intraocular lens; IOL intraocular lens; PCIOL posterior chamber intraocular lens; Phaco/IOL phacoemulsification with intraocular lens placement; PRK photorefractive keratectomy; LASIK laser assisted in situ keratomileusis; HTN hypertension; DM diabetes mellitus; COPD chronic obstructive pulmonary disease

## 2023-11-30 ENCOUNTER — Other Ambulatory Visit: Payer: Self-pay | Admitting: Family Medicine

## 2023-11-30 ENCOUNTER — Encounter (INDEPENDENT_AMBULATORY_CARE_PROVIDER_SITE_OTHER): Payer: Self-pay | Admitting: Ophthalmology

## 2023-11-30 ENCOUNTER — Ambulatory Visit (INDEPENDENT_AMBULATORY_CARE_PROVIDER_SITE_OTHER): Admitting: Ophthalmology

## 2023-11-30 DIAGNOSIS — I10 Essential (primary) hypertension: Secondary | ICD-10-CM | POA: Diagnosis not present

## 2023-11-30 DIAGNOSIS — H189 Unspecified disorder of cornea: Secondary | ICD-10-CM

## 2023-11-30 DIAGNOSIS — H35033 Hypertensive retinopathy, bilateral: Secondary | ICD-10-CM

## 2023-11-30 DIAGNOSIS — H34811 Central retinal vein occlusion, right eye, with macular edema: Secondary | ICD-10-CM

## 2023-11-30 DIAGNOSIS — Z961 Presence of intraocular lens: Secondary | ICD-10-CM

## 2023-11-30 MED ORDER — AFLIBERCEPT 2MG/0.05ML IZ SOLN FOR KALEIDOSCOPE
2.0000 mg | INTRAVITREAL | Status: AC | PRN
  Administered 2023-11-30: 2 mg via INTRAVITREAL

## 2023-12-20 ENCOUNTER — Other Ambulatory Visit: Payer: Self-pay | Admitting: Family Medicine

## 2023-12-28 ENCOUNTER — Other Ambulatory Visit: Payer: Self-pay | Admitting: Family Medicine

## 2023-12-28 DIAGNOSIS — E1169 Type 2 diabetes mellitus with other specified complication: Secondary | ICD-10-CM

## 2023-12-28 DIAGNOSIS — Z79899 Other long term (current) drug therapy: Secondary | ICD-10-CM

## 2023-12-28 DIAGNOSIS — E038 Other specified hypothyroidism: Secondary | ICD-10-CM

## 2023-12-28 DIAGNOSIS — R7303 Prediabetes: Secondary | ICD-10-CM

## 2023-12-28 NOTE — Telephone Encounter (Signed)
-  spoke with patient she states she will have labs done the morning of the appt, please place lab orders--  Copied from CRM #[id number]. Topic: Clinical - Request for Lab/Test Order >> Dec 28, 2023 10:13 AM Jordan Grant wrote: Reason for CRM: Patient is requesting bloodwork before her appointment Monday the 17th. Callback number [phone]

## 2023-12-31 ENCOUNTER — Ambulatory Visit (INDEPENDENT_AMBULATORY_CARE_PROVIDER_SITE_OTHER): Payer: Self-pay | Admitting: Family Medicine

## 2023-12-31 ENCOUNTER — Encounter: Payer: Self-pay | Admitting: Family Medicine

## 2023-12-31 VITALS — BP 123/72 | HR 84 | Temp 98.4°F | Ht 61.0 in | Wt 125.2 lb

## 2023-12-31 DIAGNOSIS — M545 Low back pain, unspecified: Secondary | ICD-10-CM | POA: Diagnosis not present

## 2023-12-31 DIAGNOSIS — Z79899 Other long term (current) drug therapy: Secondary | ICD-10-CM | POA: Diagnosis not present

## 2023-12-31 DIAGNOSIS — R7303 Prediabetes: Secondary | ICD-10-CM | POA: Diagnosis not present

## 2023-12-31 DIAGNOSIS — E1169 Type 2 diabetes mellitus with other specified complication: Secondary | ICD-10-CM

## 2023-12-31 DIAGNOSIS — H34811 Central retinal vein occlusion, right eye, with macular edema: Secondary | ICD-10-CM | POA: Diagnosis not present

## 2023-12-31 DIAGNOSIS — E038 Other specified hypothyroidism: Secondary | ICD-10-CM | POA: Diagnosis not present

## 2023-12-31 DIAGNOSIS — E785 Hyperlipidemia, unspecified: Secondary | ICD-10-CM | POA: Diagnosis not present

## 2023-12-31 DIAGNOSIS — R1084 Generalized abdominal pain: Secondary | ICD-10-CM | POA: Diagnosis not present

## 2023-12-31 DIAGNOSIS — E119 Type 2 diabetes mellitus without complications: Secondary | ICD-10-CM

## 2023-12-31 NOTE — Progress Notes (Signed)
   Subjective:    Patient ID: Jordan Grant, female    DOB: [birth year], 83 y.o.   MRN: [id number] Pt doing well overall.  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  Goes to the hip to some degree but not down the leg.  Hurts with certain movements.  Has diabetes on previous lab work she is trying to do better with that we will see what her A1c she had blood work drawn earlier today  She is taking her medication we will see how her cholesterol is doing she feels like she is tolerating medicine well  She has central retinal vein occlusion that the ophthalmologist is doing injections she has not seen much improvement  She has had abdominal pain and discomfort that is more in the mid abdomen woke her up at night no high fever chills been going on for multiple days her bowel movements seem to be going okay at times a little difficult but they tend to be soft she denies rectal bleeding denies fever or chills no vomiting   Review of Systems     Objective:   Physical Exam  General-in no acute distress Eyes-no discharge Lungs-respiratory rate normal, CTA CV-no murmurs,RRR Extremities skin warm dry no edema Neuro grossly normal Behavior normal, alert Subjective discomfort in the abdomen no guarding rebound or tenderness      Assessment & Plan:  1. Generalized abdominal pain (Primary) Recommend lab work [facility]  said that they could add this Spoke with the tech here at the office May need to have CT scan Especially if the abdominal discomfort not doing better over the next 48 hours Bland diet for now - CBC with Differential - Lipase  2. Lumbar back pain Back stretches recommended X-rays would be reasonable We will wait to see what blood work shows first  3. Prediabetes Lab work A1c pending  4. Other specified hypothyroidism Takes her medicine lab pending  5. Hyperlipidemia associated with type 2 diabetes mellitus (HCC) Takes her medicine lab pending healthy diet  6. Diabetes mellitus without complication (HCC) Healthy diet regular walking check A1c awaiting lab results  7. 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

## 2024-01-01 LAB — CMP14+EGFR
ALT: 20 IU/L (ref 0–32)
AST: 25 IU/L (ref 0–40)
Albumin: 4.4 g/dL (ref 3.7–4.7)
Alkaline Phosphatase: 74 IU/L (ref 48–129)
BUN/Creatinine Ratio: 18 (ref 12–28)
BUN: 13 mg/dL (ref 8–27)
Bilirubin Total: 0.5 mg/dL (ref 0.0–1.2)
CO2: 23 mmol/L (ref 20–29)
Calcium: 9.7 mg/dL (ref 8.7–10.3)
Chloride: 105 mmol/L (ref 96–106)
Creatinine, Ser: 0.72 mg/dL (ref 0.57–1.00)
Globulin, Total: 2.7 g/dL (ref 1.5–4.5)
Glucose: 99 mg/dL (ref 70–99)
Potassium: 4.6 mmol/L (ref 3.5–5.2)
Sodium: 141 mmol/L (ref 134–144)
Total Protein: 7.1 g/dL (ref 6.0–8.5)
eGFR: 82 mL/min/1.73 (ref >59)

## 2024-01-01 LAB — LIPID PANEL
Chol/HDL Ratio: 2.6 ratio (ref 0.0–4.4)
Cholesterol, Total: 169 mg/dL (ref 100–199)
HDL: 64 mg/dL (ref >39)
LDL Chol Calc (NIH): 85 mg/dL (ref 0–99)
Triglycerides: 110 mg/dL (ref 0–149)
VLDL Cholesterol Cal: 20 mg/dL (ref 5–40)

## 2024-01-01 LAB — TSH+FREE T4
Free T4: 1.51 ng/dL (ref 0.82–1.77)
TSH: 0.495 u[IU]/mL (ref 0.450–4.500)

## 2024-01-01 LAB — HEMOGLOBIN A1C
Est. average glucose Bld gHb Est-mCnc: 131 mg/dL
Hgb A1c MFr Bld: 6.2 % — ABNORMAL HIGH (ref 4.8–5.6)

## 2024-01-02 ENCOUNTER — Ambulatory Visit: Payer: Self-pay | Admitting: Family Medicine

## 2024-01-02 ENCOUNTER — Other Ambulatory Visit: Payer: Self-pay | Admitting: Family Medicine

## 2024-01-02 DIAGNOSIS — R1084 Generalized abdominal pain: Secondary | ICD-10-CM

## 2024-01-02 DIAGNOSIS — M545 Low back pain, unspecified: Secondary | ICD-10-CM | POA: Diagnosis not present

## 2024-01-02 NOTE — Progress Notes (Signed)
 I spoke with patient Still having intermittent abdominal pain not as bad Also lower back pain She wondered if it could be a UTI We ordered urinalysis urine culture CBC and lipase awaiting the results of this May or may not have to do additional testing

## 2024-01-03 ENCOUNTER — Other Ambulatory Visit: Payer: Self-pay | Admitting: Family Medicine

## 2024-01-03 ENCOUNTER — Ambulatory Visit: Payer: Self-pay | Admitting: Family Medicine

## 2024-01-03 LAB — CBC WITH DIFFERENTIAL/PLATELET
Basophils Absolute: 0.1 x10E3/uL (ref 0.0–0.2)
Basos: 1 %
EOS (ABSOLUTE): 0.1 x10E3/uL (ref 0.0–0.4)
Eos: 2 %
Hematocrit: 37.2 % (ref 34.0–46.6)
Hemoglobin: 11.9 g/dL (ref 11.1–15.9)
Immature Grans (Abs): 0 x10E3/uL (ref 0.0–0.1)
Immature Granulocytes: 0 %
Lymphocytes Absolute: 2.7 x10E3/uL (ref 0.7–3.1)
Lymphs: 36 %
MCH: 30.3 pg (ref 26.6–33.0)
MCHC: 32 g/dL (ref 31.5–35.7)
MCV: 95 fL (ref 79–97)
Monocytes Absolute: 0.7 x10E3/uL (ref 0.1–0.9)
Monocytes: 9 %
Neutrophils Absolute: 3.9 x10E3/uL (ref 1.4–7.0)
Neutrophils: 52 %
Platelets: 267 x10E3/uL (ref 150–450)
RBC: 3.93 x10E6/uL (ref 3.77–5.28)
RDW: 11.9 % (ref 11.7–15.4)
WBC: 7.5 x10E3/uL (ref 3.4–10.8)

## 2024-01-03 LAB — SPECIMEN STATUS REPORT

## 2024-01-03 LAB — LIPASE
Lipase: 23 U/L (ref 14–85)
Lipase: 23 U/L (ref 14–85)

## 2024-01-03 LAB — URINALYSIS
Bilirubin, UA: NEGATIVE
Glucose, UA: NEGATIVE
Ketones, UA: NEGATIVE
Nitrite, UA: NEGATIVE
Protein,UA: NEGATIVE
RBC, UA: NEGATIVE
Specific Gravity, UA: 1.021 (ref 1.005–1.030)
Urobilinogen, Ur: 0.2 mg/dL (ref 0.2–1.0)
pH, UA: 5 (ref 5.0–7.5)

## 2024-01-03 MED ORDER — TRAMADOL HCL 50 MG PO TABS: ORAL_TABLET | ORAL | 2 refills | Status: AC

## 2024-01-04 LAB — URINE CULTURE

## 2024-01-23 NOTE — Progress Notes (Signed)
 [facility] - Clinic Note  01/25/2024     CHIEF COMPLAINT Patient presents for Retina Follow Up   HISTORY OF PRESENT ILLNESS: Jordan Grant is a 83 y.o. female who presents to the clinic today for:   HPI     Retina Follow Up   Patient presents with  CRVO/BRVO.  In right eye.  Severity is moderate.  Duration of 9 weeks.  Since onset it is stable.  I, the attending physician,  performed the HPI with the patient and updated documentation appropriately.        Comments   Pt states for the past 3-4 days she has had a film that is constantly over her vision in the OD. Pt is using Systane about 4-5 time per day hoping that it might make a difference. Pt is also using a gel at bedtime. Pt denies any pain/FOL/floaters.      Last edited by Valdemar Rogue, MD on 01/25/2024 11:43 PM.      Pt states she feels there is a film over OD. She uses drops 'all the time'. Feels dry. Her drop regimen starts in the morning, uses 5-6x/day along with a warm compress. Using a gel at bedtime.   Referring physician: Alphonsa Glendia LABOR, MD [address] [place],  [place] [address]  HISTORICAL INFORMATION:   Selected notes from the MEDICAL RECORD NUMBER Referred by Dr. Alphonsa LEE: Dr. Elner 9.10.24 - IVA OD #3 Ocular Hx- CRVO w/ CME OD -- s/p retrobulbar kenalog x1, IVA OD x3 PMH-    CURRENT MEDICATIONS: Current Outpatient Medications (Ophthalmic Drugs)  Medication Sig   Polyethyl Glycol-Propyl Glycol (SYSTANE OP) Apply 1 drop to eye daily.    prednisoLONE  acetate (PRED FORTE ) 1 % ophthalmic suspension Place 1 drop into the right eye 2 (two) times daily.   No current facility-administered medications for this visit. (Ophthalmic Drugs)   Current Outpatient Medications (Other)  Medication Sig   alendronate  (FOSAMAX ) 70 MG tablet Take 1 tablet (70 mg total) by mouth every 7 (seven) days. Take with a full glass of water on an empty stomach.   levothyroxine   (SYNTHROID ) 75 MCG tablet Take 1 tablet by mouth once daily   omeprazole  (PRILOSEC) 40 MG capsule Take 1 capsule by mouth once daily   OVER THE COUNTER MEDICATION Calcium  chews (Patient not taking: Reported on 12/31/2023)   rosuvastatin  (CRESTOR ) 10 MG tablet Take 1 tablet by mouth once daily   traMADol  (ULTRAM ) 50 MG tablet TAKE 1/2 (ONE-HALF) TABLET BY MOUTH IN THE MORNING AND AT MID-AFTERNOON AND 1 IN THE EVENING   No current facility-administered medications for this visit. (Other)   REVIEW OF SYSTEMS: ROS   Positive for: Endocrine, Eyes Negative for: Constitutional, Gastrointestinal, Neurological, Skin, Genitourinary, Musculoskeletal, HENT, Cardiovascular, Respiratory, Psychiatric, Allergic/Imm, Heme/Lymph Last edited by Elnor Avelina RAMAN, COT on 01/25/2024 12:42 PM.      ALLERGIES Allergies  Allergen Reactions   Codeine     Nausea, dizziness   Darvocet [Propoxyphene N-Acetaminophen ]     nausea   Pravachol [Pravastatin Sodium]     Joint pain   PAST MEDICAL HISTORY Past Medical History:  Diagnosis Date   Allergy    Anxiety    Arthritis    hip   Cataract    hx of   Diverticulosis 2007   History of diverticula   Emphysema lung (HCC) 12/24/2019   GERD (gastroesophageal reflux disease)    Hyperlipidemia    Hypothyroidism  Prediabetes    Past Surgical History:  Procedure Laterality Date   APPENDECTOMY     breast lift Bilateral    BREAST SURGERY     CATARACT EXTRACTION W/ INTRAOCULAR LENS  IMPLANT, BILATERAL  01/14/2008   Dr Phares bilateral   COLONOSCOPY     COSMETIC SURGERY     DOPPLER ECHOCARDIOGRAPHY  06/13/2008   Leg   EYE SURGERY     HEMORROIDECTOMY     ML bone density  08/13/2009   TUBAL LIGATION     YAG LASER APPLICATION Right 09/24/2012   Procedure: YAG LASER APPLICATION;  Surgeon: Oneil T. Roz, MD;  Location: [facility];  Service: Ophthalmology;  Laterality: Right;   YAG LASER APPLICATION Left 10/08/2012   Procedure: YAG LASER APPLICATION;   Surgeon: Oneil T. Roz, MD;  Location: [facility];  Service: Ophthalmology;  Laterality: Left;   FAMILY HISTORY Family History  Problem Relation Age of Onset   Esophageal cancer Mother 71       died at 55   Stomach cancer Mother 61       removed 1/2 of stomach, distal esophageal ca   Cancer Mother    Breast cancer Maternal Aunt    Colon cancer Neg Hx    Rectal cancer Neg Hx     SOCIAL HISTORY Social History   Tobacco Use   Smoking status: Former    Current packs/day: 0.00    Types: Cigarettes    Quit date: 04/24/1983    Years since quitting: 40.7   Smokeless tobacco: Never  Vaping Use   Vaping status: Never Used  Substance Use Topics   Alcohol use: Yes    Alcohol/week: 11.0 standard drinks of alcohol    Types: 4 Glasses of wine, 7 Standard drinks or equivalent per week   Drug use: No       OPHTHALMIC EXAM:  Base Eye Exam     Visual Acuity (Snellen - Linear)       Right Left   Dist Ferney  20/20 -2   Dist cc 20/80 +1 20/25 -2   Dist ph cc 20/60 -1 20/20 -2    Correction: Glasses         Tonometry (Tonopen, 12:53 PM)       Right Left   Pressure 8 11         Pupils       Pupils Dark Light Shape React APD   Right PERRL 3 2 Round Brisk None   Left PERRL 3 2 Round Brisk None         Visual Fields       Left Right    Full Full         Extraocular Movement       Right Left    Full, Ortho Full, Ortho         Neuro/Psych     Oriented x3: Yes   Mood/Affect: Normal         Dilation     Both eyes: 1.0% Mydriacyl , 2.5% Phenylephrine @ 12:53 PM           Slit Lamp and Fundus Exam     External Exam       Right Left   External Normal Normal         Slit Lamp Exam       Right Left   Lids/Lashes Dermatochalasis, Meibomian gland dysfunction Dermatochalasis, Meibomian gland dysfunction   Conjunctiva/Sclera White and quiet White and quiet   Cornea  Arcus, Well healed cataract wound, fine KP inferiorly, corneal haze and scarring,  irregular epi inferiorly, 2-3+ Punctate epithelial erosions Arcus, Well healed cataract wound, 2+ Punctate epithelial erosions, tear film debris   Anterior Chamber Deep and clear Deep and clear   Iris Round and moderately dilated, No NVI Round and dilated   Lens MF PC IOL in good postion with open PC Multi focal PC IOL in good postion with open PC   Anterior Vitreous mild asteroid hyalosis, mild syneresis Vitreous syneresis         Fundus Exam       Right Left   Disc Pink and Sharp, mild disc elevation--improved Sharp rim, Trace pallor, mild PPA   C/D Ratio 0.2 0.4   Macula good foveal reflex, central edema -- stably improved, scattered IRH -- improved Flat, good foveal reflex, No heme or edema   Vessels Attenuated, mild tortuosity attenuated, mild tortuosity   Periphery Attached, scattered MA -- improved -- no heme Attached, No heme           Refraction     Wearing Rx       Sphere Cylinder Axis Add   Right -1.00 +2.75 162 +2.75   Left -0.50 +2.00 177 +2.75    Type: Bifocal            IMAGING AND PROCEDURES  Imaging and Procedures for 01/25/2024  OCT, Retina - OU - Both Eyes       Right Eye Quality was borderline. Central Foveal Thickness: 341. Progression has been stable. Findings include normal foveal contour, no IRF, no SRF, intraretinal hyper-reflective material (Stable improvement in central edema, foveal contour, stable improvement in disc edema, +vitreous opacities).   Left Eye Quality was good. Central Foveal Thickness: 285. Progression has been stable. Findings include normal foveal contour, no IRF, no SRF, vitreomacular adhesion .   Notes *Images captured and stored on drive  Diagnosis / Impression:  OD: CRVO w/ Stable improvement in central edema, foveal contour, stable improvement in disc edema, +vitreous opacities OS: NFP; no IRF/SRF   Clinical management:  See below  Abbreviations: NFP - Normal foveal profile. CME - cystoid macular edema. PED  - pigment epithelial detachment. IRF - intraretinal fluid. SRF - subretinal fluid. EZ - ellipsoid zone. ERM - epiretinal membrane. ORA - outer retinal atrophy. ORT - outer retinal tubulation. SRHM - subretinal hyper-reflective material. IRHM - intraretinal hyper-reflective material      Intravitreal Injection, Pharmacologic Agent - OD - Right Eye       Time Out 01/25/2024. 1:52 PM. Confirmed correct patient, procedure, site, and patient consented.   Anesthesia Topical anesthesia was used. Anesthetic medications included Lidocaine 2%, Proparacaine 0.5%.   Procedure Preparation included 5% betadine to ocular surface, eyelid speculum. A (32 g) needle was used.   Injection: 2 mg aflibercept  2 MG/0.05ML   Route: Intravitreal, Site: Right Eye   NDC: [id number], Lot: [id number], Expiration date: 03/14/2025, Waste: 0 mL   Post-op Post injection exam found visual acuity of at least counting fingers. The patient tolerated the procedure well. There were no complications. The patient received written and verbal post procedure care education.            ASSESSMENT/PLAN:    ICD-10-CM   1. 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 Hypertensive retinopathy of both eyes  H35.033     3. Essential hypertension  I10     4. Pseudophakia of both eyes  Z96.1     5. Keratopathy  H18.9       1. CRVO w/ CME, OD - previously followed with Dr. Elner - pt reports delayed presentation to Dr. Elner -- had several week history of decreased vision OD prior to presentation - s/p IVA OD x3 w/ [facility] (last 09.10.24) - s/p retrobulbar kenalog OD x1 - FA (10.07.24) OD: Scattered perivascular leakage and significant leakage peri fovea and disc - s/p IVE OD #1 (10.07.24), #2 (11.04.24), #3 (12.03.24), #4 (12.31.24), #5 (02.14.25), #6  (04.11.25), #7 (06.20.25) #8 (08.15.25) #9(10.17.25) - history of topical steroids - BCVA OD 20/60 from 20/50 - OCT shows OD Stable improvement in central edema, foveal contour, stable improvement in disc edema, +vitreous opacities at 8 wks - recommend IVE OD #10 (12.12.25) today w/ f/u ext to 9 wks  - RBA of procedure discussed, questions answered - Eylea  informed consent obtained and signed 10.07.24 (OD) - see procedure note - [employer] funding unavailable -- pt covering 20% coinsurance on medication - discussed significant inflammation OD - f/u 9 weeks -- DFE/OCT, possible injection    2,3.  Hypertensive retinopathy OU - discussed importance of tight BP control - monitor  4. Pseudophakia OU  - s/p CE/IOL with Dr. Roz (OU) 2009  - IOL in good position, doing well  - s/p YAG Cap  w/ Dr. Roz (OU) 2014  - monitor  5. Keratopathy OD - exam shows corneal haze and scarring, early band K and calcification / Salzmann's nodules inferior quadrant  - discussed findings, and likely impact on vision  - ?exacerbation by PredForte and Bromfenac  BID OD -- okay to stop  - s/p EDTA chelation OD by Dr. Meridee May 2025  - recommend f/u w/ Dr. Meridee due to film over vision/corneal build up   Ophthalmic Meds Ordered this visit:  Meds ordered this encounter  Medications   aflibercept  (EYLEA ) SOLN 2 mg     Return in about 9 weeks (around 03/28/2024) for CRVO OD, DFE, OCT, Possible Injxn.  There are no Patient Instructions on file for this visit.   This document serves as a record of services personally performed by Redell JUDITHANN Hans, MD, PhD. It was created on their behalf by Delon Newness COT, an ophthalmic technician. The creation of this record is the provider's dictation and/or activities during the visit.    Electronically signed by: Delon Newness COT 12.10.25  9:18 PM  This document serves as a record of services personally performed by Redell JUDITHANN Hans, MD,  PhD. It was created on their behalf by Almetta Pesa, an ophthalmic technician. The creation of this record is the provider's dictation and/or activities during the visit.    Electronically signed by: Almetta Pesa, OA, 01/29/2024  9:18 PM  Redell JUDITHANN Hans, M.D., Ph.D. Diseases & Surgery of the Retina and Vitreous [facility] 01/25/2024   I have reviewed the above documentation for accuracy and completeness, and I agree with the above. Redell JUDITHANN Hans, M.D., Ph.D. 01/29/2024 9:20 PM   Abbreviations: M myopia (nearsighted); A astigmatism; H hyperopia (farsighted); P presbyopia; Mrx spectacle prescription;  CTL contact lenses; OD right eye; OS left eye; OU both eyes  XT exotropia; ET esotropia; PEK punctate epithelial keratitis; PEE punctate epithelial erosions; DES dry eye syndrome; MGD meibomian gland dysfunction; ATs artificial tears;  PFAT's preservative free artificial tears; NSC nuclear sclerotic cataract; PSC posterior subcapsular cataract; ERM epi-retinal membrane; PVD posterior vitreous detachment; RD retinal detachment; DM diabetes mellitus; DR diabetic retinopathy; NPDR non-proliferative diabetic retinopathy; PDR proliferative diabetic retinopathy; CSME clinically significant macular edema; DME diabetic macular edema; dbh dot blot hemorrhages; CWS cotton wool spot; POAG primary open angle glaucoma; C/D cup-to-disc ratio; HVF humphrey visual field; GVF goldmann visual field; OCT optical coherence tomography; IOP intraocular pressure; BRVO Branch retinal vein occlusion; CRVO central retinal vein occlusion; CRAO central retinal artery occlusion; BRAO branch retinal artery occlusion; RT retinal tear; SB scleral buckle; PPV pars plana vitrectomy; VH Vitreous hemorrhage; PRP panretinal laser photocoagulation; IVK intravitreal kenalog; VMT vitreomacular traction; MH Macular hole;  NVD neovascularization of the disc; NVE neovascularization elsewhere; AREDS age related eye  disease study; ARMD age related macular degeneration; POAG primary open angle glaucoma; EBMD epithelial/anterior basement membrane dystrophy; ACIOL anterior chamber intraocular lens; IOL intraocular lens; PCIOL posterior chamber intraocular lens; Phaco/IOL phacoemulsification with intraocular lens placement; PRK photorefractive keratectomy; LASIK laser assisted in situ keratomileusis; HTN hypertension; DM diabetes mellitus; COPD chronic obstructive pulmonary disease

## 2024-01-25 ENCOUNTER — Encounter (INDEPENDENT_AMBULATORY_CARE_PROVIDER_SITE_OTHER): Payer: Self-pay | Admitting: Ophthalmology

## 2024-01-25 ENCOUNTER — Ambulatory Visit (INDEPENDENT_AMBULATORY_CARE_PROVIDER_SITE_OTHER): Admitting: Ophthalmology

## 2024-01-25 DIAGNOSIS — H34811 Central retinal vein occlusion, right eye, with macular edema: Secondary | ICD-10-CM

## 2024-01-25 DIAGNOSIS — I10 Essential (primary) hypertension: Secondary | ICD-10-CM | POA: Diagnosis not present

## 2024-01-25 DIAGNOSIS — H35033 Hypertensive retinopathy, bilateral: Secondary | ICD-10-CM | POA: Diagnosis not present

## 2024-01-25 DIAGNOSIS — Z961 Presence of intraocular lens: Secondary | ICD-10-CM | POA: Diagnosis not present

## 2024-01-25 DIAGNOSIS — H189 Unspecified disorder of cornea: Secondary | ICD-10-CM

## 2024-01-25 MED ORDER — AFLIBERCEPT 2MG/0.05ML IZ SOLN FOR KALEIDOSCOPE
2.0000 mg | INTRAVITREAL | Status: AC | PRN
  Administered 2024-01-25: 2 mg via INTRAVITREAL

## 2024-02-04 ENCOUNTER — Other Ambulatory Visit (HOSPITAL_COMMUNITY): Payer: Self-pay | Admitting: Family Medicine

## 2024-02-04 DIAGNOSIS — Z1231 Encounter for screening mammogram for malignant neoplasm of breast: Secondary | ICD-10-CM

## 2024-02-11 ENCOUNTER — Encounter: Payer: Self-pay | Admitting: Family Medicine

## 2024-02-11 ENCOUNTER — Ambulatory Visit (INDEPENDENT_AMBULATORY_CARE_PROVIDER_SITE_OTHER): Admitting: Family Medicine

## 2024-02-11 VITALS — BP 124/74 | HR 74 | Temp 97.7°F | Ht 61.0 in | Wt 124.0 lb

## 2024-02-11 DIAGNOSIS — M545 Low back pain, unspecified: Secondary | ICD-10-CM | POA: Diagnosis not present

## 2024-02-11 DIAGNOSIS — Z23 Encounter for immunization: Secondary | ICD-10-CM | POA: Diagnosis not present

## 2024-02-11 DIAGNOSIS — H34811 Central retinal vein occlusion, right eye, with macular edema: Secondary | ICD-10-CM

## 2024-02-11 DIAGNOSIS — R7303 Prediabetes: Secondary | ICD-10-CM

## 2024-02-11 NOTE — Progress Notes (Signed)
" ° °  Subjective:    Patient ID: Yetta LELON Point, female    DOB: 1940/08/11, 83 y.o.   MRN: [id number]  HPI 6 month follow up with low back pain flare up  Patient relates low back pain Does not radiate down the legs Intermittently causes her problems sometimes during the day sometimes when she first gets up No known injury Takes Tylenol , does stretches  Abdominal discomfort from previous visit went away  Patient is try to watch her diet and stay physically active no falls Takes her medication on a regular basis  Review of Systems     Objective:   Physical Exam General-in no acute distress Eyes-no discharge Lungs-respiratory rate normal, CTA CV-no murmurs,RRR Extremities skin warm dry no edema Neuro grossly normal Behavior normal, alert Subjective low back pain       Assessment & Plan:   1. Immunization due (Primary) Today - Flu vaccine HIGH DOSE PF(Fluzone Trivalent)  2. Lumbar back pain Recommend x-rays she will do these at her convenience in the near future  3. Prediabetes Continue current measures watch diet Labs today  4. 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

## 2024-02-20 ENCOUNTER — Ambulatory Visit (HOSPITAL_COMMUNITY)

## 2024-02-20 ENCOUNTER — Ambulatory Visit: Payer: Self-pay

## 2024-02-20 NOTE — Telephone Encounter (Signed)
" °  FYI Only or Action Required?: FYI only for provider: appointment scheduled on 02/20/24.  Patient was last seen in primary care on 02/11/2024 by Alphonsa Glendia LABOR, MD.  Called Nurse Triage reporting Sinusitis.  Symptoms began a week ago.  Interventions attempted: OTC medications: tylenol .  Symptoms are: gradually worsening.  Triage Disposition: See PCP When Office is Open (Within 3 Days)  Patient/caregiver understands and will follow disposition?: Yes  Copied from CRM #[id number]. Topic: Clinical - Red Word Triage >> Feb 20, 2024 11:05 AM Richerd B wrote: [facility] that prompted transfer to Nurse Triage: Patient has bad cold, states gets worse at night wth stiff neck Reason for Disposition  [1] Using nasal washes and pain medicine > 24 hours AND [2] sinus pain (around cheekbone or eye) persists  Answer Assessment - Initial Assessment Questions 1. LOCATION: Where does it hurt?      Forehead/ facial pain 2. ONSET: When did the sinus pain start?  (e.g., hours, days)      A week ago 3. SEVERITY: How bad is the pain?   (Scale 0-10; or none, mild, moderate or severe)     mild 4. RECURRENT SYMPTOM: Have you ever had sinus problems before? If Yes, ask: When was the last time? and What happened that time?       5. NASAL CONGESTION: Is the nose blocked? If Yes, ask: Can you open it or must you breathe through your mouth?     yes 6. NASAL DISCHARGE: Do you have discharge from your nose? If so ask, What color?     Yes, green/ yellow 7. FEVER: Do you have a fever? If Yes, ask: What is it, how was it measured, and when did it start?      denies 8. OTHER SYMPTOMS: Do you have any other symptoms? (e.g., sore throat, cough, earache, difficulty breathing)     Mild cough  Protocols used: Sinus Pain or Congestion-A-AH  "

## 2024-02-21 ENCOUNTER — Ambulatory Visit

## 2024-03-12 ENCOUNTER — Encounter (HOSPITAL_COMMUNITY): Payer: Self-pay

## 2024-03-12 ENCOUNTER — Ambulatory Visit (HOSPITAL_COMMUNITY)
Admission: RE | Admit: 2024-03-12 | Discharge: 2024-03-12 | Disposition: A | Discharge status: Home / Self Care | Source: Ambulatory Visit | Attending: Family Medicine | Admitting: Family Medicine

## 2024-03-12 DIAGNOSIS — Z1231 Encounter for screening mammogram for malignant neoplasm of breast: Secondary | ICD-10-CM | POA: Diagnosis present

## 2024-03-17 ENCOUNTER — Other Ambulatory Visit: Payer: Self-pay | Admitting: Nurse Practitioner

## 2024-03-28 ENCOUNTER — Encounter (INDEPENDENT_AMBULATORY_CARE_PROVIDER_SITE_OTHER): Admitting: Ophthalmology

## 2024-08-11 ENCOUNTER — Ambulatory Visit: Admitting: Family Medicine
